# Patient Record
Sex: Female | Born: 1970 | Race: Black or African American | Hispanic: No | Marital: Married | Smoking: Former smoker
Health system: Southern US, Community
[De-identification: ages and names within clinical notes are randomized; demographics above are authoritative.]

## PROBLEM LIST (undated history)

## (undated) DIAGNOSIS — D573 Sickle-cell trait: Secondary | ICD-10-CM

## (undated) DIAGNOSIS — F419 Anxiety disorder, unspecified: Secondary | ICD-10-CM

## (undated) DIAGNOSIS — M24573 Contracture, unspecified ankle: Secondary | ICD-10-CM

## (undated) DIAGNOSIS — R42 Dizziness and giddiness: Secondary | ICD-10-CM

## (undated) DIAGNOSIS — A812 Progressive multifocal leukoencephalopathy: Secondary | ICD-10-CM

## (undated) DIAGNOSIS — M25579 Pain in unspecified ankle and joints of unspecified foot: Secondary | ICD-10-CM

## (undated) DIAGNOSIS — T7421XA Adult sexual abuse, confirmed, initial encounter: Secondary | ICD-10-CM

## (undated) DIAGNOSIS — N926 Irregular menstruation, unspecified: Secondary | ICD-10-CM

## (undated) DIAGNOSIS — F4310 Post-traumatic stress disorder, unspecified: Secondary | ICD-10-CM

## (undated) DIAGNOSIS — B20 Human immunodeficiency virus [HIV] disease: Secondary | ICD-10-CM

## (undated) DIAGNOSIS — Z590 Homelessness: Secondary | ICD-10-CM

## (undated) DIAGNOSIS — I639 Cerebral infarction, unspecified: Secondary | ICD-10-CM

## (undated) DIAGNOSIS — Z91410 Personal history of adult physical and sexual abuse: Secondary | ICD-10-CM

## (undated) DIAGNOSIS — G8190 Hemiplegia, unspecified affecting unspecified side: Secondary | ICD-10-CM

## (undated) DIAGNOSIS — M24576 Contracture, unspecified foot: Secondary | ICD-10-CM

## (undated) HISTORY — DX: Personal history of adult physical and sexual abuse: Z91.410

## (undated) HISTORY — DX: Human immunodeficiency virus (HIV) disease: B20

## (undated) HISTORY — PX: DENTAL SURGERY: SHX609

## (undated) HISTORY — DX: Irregular menstruation, unspecified: N92.6

## (undated) HISTORY — DX: Contracture, unspecified foot: M24.576

## (undated) HISTORY — DX: Hemiplegia, unspecified affecting unspecified side: G81.90

## (undated) HISTORY — DX: Homelessness: Z59.0

## (undated) HISTORY — DX: Contracture, unspecified ankle: M24.573

## (undated) HISTORY — DX: Pain in unspecified ankle and joints of unspecified foot: M25.579

## (undated) HISTORY — DX: Post-traumatic stress disorder, unspecified: F43.10

## (undated) HISTORY — DX: Adult sexual abuse, confirmed, initial encounter: T74.21XA

---

## 2011-01-25 DIAGNOSIS — G8929 Other chronic pain: Secondary | ICD-10-CM | POA: Insufficient documentation

## 2011-01-25 DIAGNOSIS — B20 Human immunodeficiency virus [HIV] disease: Secondary | ICD-10-CM | POA: Insufficient documentation

## 2011-01-25 DIAGNOSIS — A812 Progressive multifocal leukoencephalopathy: Secondary | ICD-10-CM | POA: Insufficient documentation

## 2011-11-29 DIAGNOSIS — B1920 Unspecified viral hepatitis C without hepatic coma: Secondary | ICD-10-CM | POA: Insufficient documentation

## 2012-11-13 DIAGNOSIS — R079 Chest pain, unspecified: Secondary | ICD-10-CM | POA: Insufficient documentation

## 2013-03-05 DIAGNOSIS — Z23 Encounter for immunization: Secondary | ICD-10-CM | POA: Insufficient documentation

## 2013-08-06 DIAGNOSIS — J302 Other seasonal allergic rhinitis: Secondary | ICD-10-CM | POA: Insufficient documentation

## 2014-04-30 DIAGNOSIS — Z8742 Personal history of other diseases of the female genital tract: Secondary | ICD-10-CM | POA: Insufficient documentation

## 2014-04-30 DIAGNOSIS — A630 Anogenital (venereal) warts: Secondary | ICD-10-CM | POA: Insufficient documentation

## 2014-09-02 DIAGNOSIS — R29898 Other symptoms and signs involving the musculoskeletal system: Secondary | ICD-10-CM | POA: Insufficient documentation

## 2014-10-07 DIAGNOSIS — K0889 Other specified disorders of teeth and supporting structures: Secondary | ICD-10-CM | POA: Insufficient documentation

## 2015-03-03 DIAGNOSIS — Z7251 High risk heterosexual behavior: Secondary | ICD-10-CM | POA: Insufficient documentation

## 2015-08-18 DIAGNOSIS — N390 Urinary tract infection, site not specified: Secondary | ICD-10-CM | POA: Insufficient documentation

## 2015-10-13 DIAGNOSIS — F1410 Cocaine abuse, uncomplicated: Secondary | ICD-10-CM | POA: Insufficient documentation

## 2016-12-07 ENCOUNTER — Ambulatory Visit (INDEPENDENT_AMBULATORY_CARE_PROVIDER_SITE_OTHER): Payer: Medicare (Managed Care) | Admitting: Infectious Disease

## 2016-12-07 ENCOUNTER — Ambulatory Visit (INDEPENDENT_AMBULATORY_CARE_PROVIDER_SITE_OTHER): Payer: Medicare (Managed Care) | Admitting: Licensed Clinical Social Worker

## 2016-12-07 ENCOUNTER — Encounter: Payer: Self-pay | Admitting: Infectious Disease

## 2016-12-07 VITALS — BP 116/73 | HR 73 | Temp 98.4°F | Wt 178.0 lb

## 2016-12-07 DIAGNOSIS — A812 Progressive multifocal leukoencephalopathy: Secondary | ICD-10-CM | POA: Diagnosis not present

## 2016-12-07 DIAGNOSIS — B182 Chronic viral hepatitis C: Secondary | ICD-10-CM | POA: Diagnosis not present

## 2016-12-07 DIAGNOSIS — T7421XA Adult sexual abuse, confirmed, initial encounter: Secondary | ICD-10-CM | POA: Insufficient documentation

## 2016-12-07 DIAGNOSIS — Z5900 Homelessness unspecified: Secondary | ICD-10-CM

## 2016-12-07 DIAGNOSIS — Z7251 High risk heterosexual behavior: Secondary | ICD-10-CM | POA: Diagnosis not present

## 2016-12-07 DIAGNOSIS — B20 Human immunodeficiency virus [HIV] disease: Secondary | ICD-10-CM | POA: Insufficient documentation

## 2016-12-07 DIAGNOSIS — I69851 Hemiplegia and hemiparesis following other cerebrovascular disease affecting right dominant side: Secondary | ICD-10-CM

## 2016-12-07 DIAGNOSIS — G8190 Hemiplegia, unspecified affecting unspecified side: Secondary | ICD-10-CM

## 2016-12-07 DIAGNOSIS — G8929 Other chronic pain: Secondary | ICD-10-CM

## 2016-12-07 DIAGNOSIS — F4322 Adjustment disorder with anxiety: Secondary | ICD-10-CM

## 2016-12-07 DIAGNOSIS — F4310 Post-traumatic stress disorder, unspecified: Secondary | ICD-10-CM | POA: Diagnosis not present

## 2016-12-07 DIAGNOSIS — F1410 Cocaine abuse, uncomplicated: Secondary | ICD-10-CM

## 2016-12-07 DIAGNOSIS — Z23 Encounter for immunization: Secondary | ICD-10-CM | POA: Diagnosis not present

## 2016-12-07 HISTORY — DX: Post-traumatic stress disorder, unspecified: F43.10

## 2016-12-07 HISTORY — DX: Homelessness unspecified: Z59.00

## 2016-12-07 HISTORY — DX: Adult sexual abuse, confirmed, initial encounter: T74.21XA

## 2016-12-07 HISTORY — DX: Hemiplegia, unspecified affecting unspecified side: G81.90

## 2016-12-07 HISTORY — DX: Human immunodeficiency virus (HIV) disease: B20

## 2016-12-07 MED ORDER — TRAZODONE HCL 50 MG PO TABS: 50.0000 mg | ORAL_TABLET | Freq: Every day | ORAL | 11 refills | Status: DC

## 2016-12-07 MED ORDER — DARUN-COBIC-EMTRICIT-TENOFAF 800-150-200-10 MG PO TABS: 1.0000 | ORAL_TABLET | Freq: Every day | ORAL | 5 refills | Status: DC
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
## 2016-12-07 NOTE — Progress Notes (Signed)
HPI: Paula Martinez is a 46 y.o. female who presents to the [facility] clinic to follow-up with Dr. Tommy Medal for her HIV infection.   Allergies: Not on File  Past Medical History: Past Medical History:  Diagnosis Date  . HIV disease ([place]) 12/07/2016    Social History: Social History   Social History  . Marital status: Single    Spouse name: N/A  . Number of children: N/A  . Years of education: N/A   Social History Main Topics  . Smoking status: Former Smoker    Packs/day: 0.20    Types: Cigarettes  . Smokeless tobacco: Never Used  . Alcohol use 0.6 oz/week    1 Glasses of wine per week     Comment: occ  . Drug use: No  . Sexual activity: Not Asked   Other Topics Concern  . None   Social History Narrative  . None    Current Regimen: Reyataz + Norvir + Truvada  Labs: No results found for: HIV1RNAQUANT, HIV1RNAVL, CD4TABS, HEPBSAB, HEPBSAG, HCVAB  CrCl: CrCl cannot be calculated (No order found.).  Lipids: No results found for: CHOL, TRIG, HDL, CHOLHDL, VLDL, LDLCALC  Assessment: Paula Martinez is here today to follow-up with Dr. Tommy Medal for her HIV infection.  She is currently taking Reyataz + Norvir + Truvada.   She is a new patient to our clinic.  Dr. Tommy Medal would like to simply her regimen.  Since we have no history or drug resistance data, we will change her to [place].  She used to get her medications from [facility] in [place] and would like to go back to using them as they treat her very well and are very good to her.  I spent some time explaining Symtuza to her and how to take it, including with a meal.  I gave her my card and told her to call me with any issues.  She will come back and see [place] in 1 month.   Plans: - Stop Reyataz, Norvir, and Truvada - Start Symtuza PO once daily - F/u with me 10/30 at 230pm  Cassie L. Kuppelweiser, PharmD, [facility] for Infectious Disease 12/07/2016, 3:06 PM

## 2016-12-08 ENCOUNTER — Telehealth: Payer: Self-pay | Admitting: *Deleted

## 2016-12-08 DIAGNOSIS — B20 Human immunodeficiency virus [HIV] disease: Secondary | ICD-10-CM
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
## 2016-12-08 NOTE — Telephone Encounter (Signed)
Referral received to offer services to Ms Cissell. Assistance may be short term since the patient historically has done excellent with managing her HIV but may still be needed since she is new to the area/resources. Contacted Ms Urban Gibson and did not receive an answer, message left.  Received a call back and spoke with Ms. Urban Gibson who is excited to have a visit with me to discuss possible resources for her. Home visit scheduled for tomorrow

## 2016-12-08 NOTE — Telephone Encounter (Signed)
Received call from Travis(CMA) stating Dr Drucilla Schmidt would like for me engage and offer assistance with Ms. Urban Gibson. Report given on patient's possible current needs. Call will be placed to [name] to offer services

## 2016-12-09 ENCOUNTER — Ambulatory Visit: Payer: Self-pay | Admitting: *Deleted

## 2016-12-09 ENCOUNTER — Telehealth: Payer: Self-pay | Admitting: *Deleted

## 2016-12-09 VITALS — BP 110/68 | HR 84

## 2016-12-09 DIAGNOSIS — B20 Human immunodeficiency virus [HIV] disease: Secondary | ICD-10-CM

## 2016-12-09 DIAGNOSIS — A812 Progressive multifocal leukoencephalopathy: Secondary | ICD-10-CM

## 2016-12-09 DIAGNOSIS — T7421XA Adult sexual abuse, confirmed, initial encounter: Secondary | ICD-10-CM

## 2016-12-09 LAB — T-HELPER CELL (CD4) - (RCID CLINIC ONLY)
CD4 % Helper T Cell: 31 % — ABNORMAL LOW (ref 33–55)
CD4 T Cell Abs: 1200 /uL (ref 400–2700)

## 2016-12-12 ENCOUNTER — Encounter: Payer: Self-pay | Admitting: *Deleted

## 2016-12-14 ENCOUNTER — Telehealth: Payer: Self-pay | Admitting: Licensed Clinical Social Worker

## 2016-12-14 ENCOUNTER — Ambulatory Visit: Payer: Medicare (Managed Care) | Admitting: Licensed Clinical Social Worker

## 2016-12-14 NOTE — Telephone Encounter (Signed)
Patient called and stated that she forgot about attending appointment but wanted to wait before she schedules another appointment.  Patient stated she has an appointment with [facility] therapist on Oct 11th and plans to attend.  Sande Rives, [facility]

## 2016-12-14 NOTE — Telephone Encounter (Signed)
[facility] left message regarding missed appointment today requesting return phone call.  Paula Martinez, [facility]

## 2016-12-19 LAB — LIPID PANEL
CHOL/HDL RATIO: 2.8 (calc) (ref <5.0)
CHOLESTEROL: 147 mg/dL (ref <200)
HDL: 52 mg/dL (ref >50)
LDL CHOLESTEROL (CALC): 79 mg/dL
NON-HDL CHOLESTEROL (CALC): 95 mg/dL (ref <130)
Triglycerides: 78 mg/dL (ref <150)

## 2016-12-19 LAB — CBC WITH DIFFERENTIAL/PLATELET
BASOS ABS: 33 {cells}/uL (ref 0–200)
Basophils Relative: 0.5 %
Eosinophils Absolute: 130 cells/uL (ref 15–500)
Eosinophils Relative: 2 %
HEMATOCRIT: 41.3 % (ref 35.0–45.0)
Hemoglobin: 14.4 g/dL (ref 11.7–15.5)
LYMPHS ABS: 3523 {cells}/uL (ref 850–3900)
MCH: 29.4 pg (ref 27.0–33.0)
MCHC: 34.9 g/dL (ref 32.0–36.0)
MCV: 84.3 fL (ref 80.0–100.0)
MPV: 12.1 fL (ref 7.5–12.5)
Monocytes Relative: 6 %
NEUTROS PCT: 37.3 %
Neutro Abs: 2425 cells/uL (ref 1500–7800)
Platelets: 204 10*3/uL (ref 140–400)
RBC: 4.9 10*6/uL (ref 3.80–5.10)
RDW: 12.8 % (ref 11.0–15.0)
Total Lymphocyte: 54.2 %
WBC: 6.5 10*3/uL (ref 3.8–10.8)
WBCMIX: 390 {cells}/uL (ref 200–950)

## 2016-12-19 LAB — HIV-1 RNA ULTRAQUANT REFLEX TO GENTYP+: HIV-1 RNA Quant, Log: <1.3 Log cps/mL — ABNORMAL HIGH

## 2016-12-19 LAB — HEPATITIS C RNA QUANTITATIVE
HCV Quantitative Log: <1.18 Log IU/mL
HCV RNA, PCR, QN: <15 IU/mL

## 2016-12-19 LAB — COMPLETE METABOLIC PANEL WITH GFR
AG Ratio: 1.4 (calc) (ref 1.0–2.5)
ALBUMIN MSPROF: 4 g/dL (ref 3.6–5.1)
ALKALINE PHOSPHATASE (APISO): 84 U/L (ref 33–115)
ALT: 13 U/L (ref 6–29)
AST: 13 U/L (ref 10–35)
BUN: 12 mg/dL (ref 7–25)
CO2: 21 mmol/L (ref 20–32)
CREATININE: 0.66 mg/dL (ref 0.50–1.10)
Calcium: 9.6 mg/dL (ref 8.6–10.2)
Chloride: 108 mmol/L (ref 98–110)
GFR, EST AFRICAN AMERICAN: 123 mL/min/{1.73_m2} (ref >=60)
GFR, EST NON AFRICAN AMERICAN: 106 mL/min/{1.73_m2} (ref >=60)
GLOBULIN: 2.9 g/dL (ref 1.9–3.7)
Glucose, Bld: 91 mg/dL (ref 65–99)
Potassium: 3.9 mmol/L (ref 3.5–5.3)
SODIUM: 139 mmol/L (ref 135–146)
TOTAL PROTEIN: 6.9 g/dL (ref 6.1–8.1)
Total Bilirubin: 2.1 mg/dL — ABNORMAL HIGH (ref 0.2–1.2)

## 2016-12-19 LAB — RPR: RPR Ser Ql: NONREACTIVE

## 2016-12-21 ENCOUNTER — Telehealth: Payer: Self-pay | Admitting: *Deleted

## 2016-12-21 NOTE — Telephone Encounter (Signed)
Contacted [employer] for assistance with available resources for patients that need assistance in the home but cannot afford them. APS says they have a free in home aide program but it has a long waiting list. APS stated [name] can be placed on the waiting list for services. Left a message with the free in home aide program through [employer] with the reason why Ms Paula Martinez needs the assistance along with her contact information for a callback

## 2016-12-21 NOTE — Telephone Encounter (Signed)
Contact PT/Rehab center to f/u on referral sent by Dr Tommy Medal. I would like to know if that have been able to reach the patient for scheduling an assessment. Left a message stating the above along with my name and number in additional to the patient's name and number

## 2017-01-04 ENCOUNTER — Telehealth: Payer: Self-pay | Admitting: *Deleted

## 2017-01-04 ENCOUNTER — Encounter: Payer: Self-pay | Admitting: Infectious Disease

## 2017-01-04 NOTE — Telephone Encounter (Signed)
RN received a text message from Ms Paula Martinez stating she is having a lot of pain and would like to know when she can start her physical therapy. Rehab has stated it will take up to 4 to 6 weeks to schedule the patient for therapy. RN contacted Rehab to see if the 4 to 6 weeks applies to all patients even if they are living alone and a high falls risk (for example a new stroke patient that lives alone). I reviewed the referral and I can see that the PT scheduling is being worked on so I did not place a call for Therapy updates. I did respond back to [name] making her aware that her referral for therapy is being worked on

## 2017-01-06 ENCOUNTER — Other Ambulatory Visit: Payer: Self-pay | Admitting: Pharmacist

## 2017-01-10 ENCOUNTER — Ambulatory Visit: Payer: Medicare (Managed Care)
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
## 2017-01-16 NOTE — Patient Instructions (Signed)
RN met with client for assessment. RN reviewed Agency Services, [place] Notice of Privacy Policy, Home Safety Management Information Booklet. Home Fire Safety Assessment, Fall Risk Assessment and Suicide Risk Assessment was performed. RN also discussed information on a Living Will, Advanced Directives, and Health Care Power of Attorney. RN and Client/Designated Party educated/reviewed/signed Client Agreement and Consent for Service form along with Patient Rights and Responsibilities statement. RN developed patient specific and centered care plan. RN provided contact information and reviewed how to receive emergency help after hours for schedule changes, billing questions, reporting of safety issues, falls, concerns or any needs/questions. Standard Precaution and Infection control along with interventions to correct or prevent high risk behaviors instructed to the patient. Client/Caregiver reports understanding and agreement with the above 

## 2017-01-17 ENCOUNTER — Encounter: Payer: Self-pay | Admitting: *Deleted

## 2017-01-18 ENCOUNTER — Ambulatory Visit: Payer: Self-pay | Admitting: *Deleted

## 2017-01-18 DIAGNOSIS — A812 Progressive multifocal leukoencephalopathy: Secondary | ICD-10-CM

## 2017-01-18 DIAGNOSIS — B20 Human immunodeficiency virus [HIV] disease: Secondary | ICD-10-CM

## 2017-01-18 NOTE — Progress Notes (Signed)
RN made a home visit with Paula Martinez. She states all has been well. She has here medications and continues to take the medications each day. She has seen vocational services because she would like to be evaluated for employment. She also had a evaluation for in home aide services offered through [facility]. Together we reviewed the updates on her therapy referral and she is in agreement to any type of therapy that may increase her mobility. Paula Martinez would love to be a part of peer support for other women living with HIV. Paula Martinez also stated that she really needs a PAP smear because she has a hx of HPV and would like to be evaluated for precancerous cells. Message sent to our NP asking if the patient can receive a PAP smear during her visit with Dr Tommy Medal on the 26th since transportation is a concern for the patient. Typically Paula Martinez uses [name] which is expensive. She is using condoms and denies additional condoms at this time stating she has plenty  Visit for over 2hrs with Paula Martinez.

## 2017-01-30 ENCOUNTER — Telehealth: Payer: Self-pay | Admitting: *Deleted

## 2017-01-30 ENCOUNTER — Encounter: Payer: Self-pay | Admitting: *Deleted

## 2017-01-30 ENCOUNTER — Ambulatory Visit: Payer: Self-pay | Admitting: *Deleted

## 2017-01-30 DIAGNOSIS — B20 Human immunodeficiency virus [HIV] disease: Secondary | ICD-10-CM

## 2017-01-30 DIAGNOSIS — T7421XA Adult sexual abuse, confirmed, initial encounter: Secondary | ICD-10-CM

## 2017-01-30 DIAGNOSIS — A812 Progressive multifocal leukoencephalopathy: Secondary | ICD-10-CM

## 2017-01-30 NOTE — Telephone Encounter (Signed)
Received a message from the patient asking me to please give her a call. Spoke with Ms Paula Martinez and she asked if I could come over to assist her today. RN scheduled a home visit for Ms Paula Martinez for today

## 2017-02-06 ENCOUNTER — Encounter: Payer: Self-pay | Admitting: Infectious Disease

## 2017-02-06 ENCOUNTER — Other Ambulatory Visit: Payer: Self-pay | Admitting: Infectious Disease

## 2017-02-06 ENCOUNTER — Ambulatory Visit (INDEPENDENT_AMBULATORY_CARE_PROVIDER_SITE_OTHER): Payer: Medicare (Managed Care) | Admitting: Infectious Disease

## 2017-02-06 ENCOUNTER — Ambulatory Visit (INDEPENDENT_AMBULATORY_CARE_PROVIDER_SITE_OTHER): Payer: Medicare (Managed Care) | Admitting: Licensed Clinical Social Worker

## 2017-02-06 VITALS — BP 146/82 | HR 73 | Temp 99.6°F

## 2017-02-06 DIAGNOSIS — R87618 Other abnormal cytological findings on specimens from cervix uteri: Secondary | ICD-10-CM

## 2017-02-06 DIAGNOSIS — R252 Cramp and spasm: Secondary | ICD-10-CM

## 2017-02-06 DIAGNOSIS — I69851 Hemiplegia and hemiparesis following other cerebrovascular disease affecting right dominant side: Secondary | ICD-10-CM | POA: Diagnosis not present

## 2017-02-06 DIAGNOSIS — F4322 Adjustment disorder with anxiety: Secondary | ICD-10-CM

## 2017-02-06 DIAGNOSIS — M24573 Contracture, unspecified ankle: Secondary | ICD-10-CM

## 2017-02-06 DIAGNOSIS — M24571 Contracture, right ankle: Secondary | ICD-10-CM

## 2017-02-06 DIAGNOSIS — M24574 Contracture, right foot: Secondary | ICD-10-CM

## 2017-02-06 DIAGNOSIS — M25579 Pain in unspecified ankle and joints of unspecified foot: Secondary | ICD-10-CM | POA: Insufficient documentation

## 2017-02-06 DIAGNOSIS — F4310 Post-traumatic stress disorder, unspecified: Secondary | ICD-10-CM

## 2017-02-06 DIAGNOSIS — A812 Progressive multifocal leukoencephalopathy: Secondary | ICD-10-CM | POA: Diagnosis not present

## 2017-02-06 DIAGNOSIS — M24576 Contracture, unspecified foot: Secondary | ICD-10-CM

## 2017-02-06 DIAGNOSIS — B20 Human immunodeficiency virus [HIV] disease: Secondary | ICD-10-CM

## 2017-02-06 DIAGNOSIS — T7421XA Adult sexual abuse, confirmed, initial encounter: Secondary | ICD-10-CM

## 2017-02-06 DIAGNOSIS — G8929 Other chronic pain: Secondary | ICD-10-CM

## 2017-02-06 DIAGNOSIS — Z113 Encounter for screening for infections with a predominantly sexual mode of transmission: Secondary | ICD-10-CM

## 2017-02-06 DIAGNOSIS — Z79899 Other long term (current) drug therapy: Secondary | ICD-10-CM

## 2017-02-06 DIAGNOSIS — M25571 Pain in right ankle and joints of right foot: Secondary | ICD-10-CM

## 2017-02-06 HISTORY — DX: Pain in unspecified ankle and joints of unspecified foot: M25.579

## 2017-02-06 HISTORY — DX: Contracture, unspecified ankle: M24.573

## 2017-02-06 HISTORY — DX: Contracture, unspecified foot: M24.576
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
## 2017-02-06 NOTE — BH Specialist Note (Signed)
Integrated Behavioral Health Follow Up Visit  MRN: [id number] Name: Paula Martinez  Number of [place] Clinician visits: 2/6 Session Start time: 12:01 pm  Session End time: 12:06 pm Total time: 5 mins  Type of Service: [place] Interpretor:No. Interpretor Name and Language: N/A  SUBJECTIVE: Paula Martinez is a 46 y.o. female accompanied by self Patient was referred by Dr. Tommy Medal pain.  Patient stated that she was not sure why she was meeting with the [facility], even though Dr. Tommy Medal introduced [facility] to patient.  [facility] asked patient if she remembered her and patient stated, "Of course I do.  I'm in a wheel chair not stupid".  Throughout the interview the patient was agitated and irritable and when the [facility] mentioned the level of anger in the room the patient stated that it was not directed toward the [facility] but because she was in pain.  Patient also reported that she attends "therapy" with Earl Lagos and then referred to her as a "Medical doctor".  She stated that she meets with her monthly because she only has "PTSD".  Patient then shared that she informed the [facility] of this on a previous occasion and that she does not need to meet with the [facility] further.    Severity of problem: moderate  OBJECTIVE: Mood: Irritable and agitated and Affect: Anger Risk of harm to self or others: No plan to harm self or others  ASSESSMENT: Patient is currently experiencing anger and irritability, which may be related to the experience of pain.  Patient may benefit from receiving a pain assessment from medical staff, behavioral health services and medication management.  GOALS ADDRESSED: Patient will: 1.  Reduce symptoms of: anxiety and anger  2.  Increase knowledge and/or ability of: coping skills and self-management skills  3.  Demonstrate ability to: Increase healthy adjustment to current life circumstances, Increase adequate support systems for  patient/family and Increase motivation to adhere to plan of care  INTERVENTIONS: Interventions utilized:  Solution-Focused Strategies  PLAN: 1. Patient will continue receiving behavioral health services from [facility].  Sande Rives, [facility]

## 2017-02-07 LAB — CBC WITH DIFFERENTIAL/PLATELET
BASOS ABS: 51 {cells}/uL (ref 0–200)
Basophils Relative: 0.9 %
EOS ABS: 108 {cells}/uL (ref 15–500)
Eosinophils Relative: 1.9 %
HEMATOCRIT: 40.8 % (ref 35.0–45.0)
Hemoglobin: 14.1 g/dL (ref 11.7–15.5)
LYMPHS ABS: 2690 {cells}/uL (ref 850–3900)
MCH: 29 pg (ref 27.0–33.0)
MCHC: 34.6 g/dL (ref 32.0–36.0)
MCV: 84 fL (ref 80.0–100.0)
MPV: 11.6 fL (ref 7.5–12.5)
Monocytes Relative: 5.8 %
Neutro Abs: 2519 cells/uL (ref 1500–7800)
Neutrophils Relative %: 44.2 %
PLATELETS: 243 10*3/uL (ref 140–400)
RBC: 4.86 10*6/uL (ref 3.80–5.10)
RDW: 12.8 % (ref 11.0–15.0)
TOTAL LYMPHOCYTE: 47.2 %
WBC: 5.7 10*3/uL (ref 3.8–10.8)
WBCMIX: 331 {cells}/uL (ref 200–950)

## 2017-02-07 LAB — COMPLETE METABOLIC PANEL WITH GFR
AG Ratio: 1.3 (calc) (ref 1.0–2.5)
ALKALINE PHOSPHATASE (APISO): 67 U/L (ref 33–115)
ALT: 18 U/L (ref 6–29)
AST: 14 U/L (ref 10–35)
Albumin: 3.8 g/dL (ref 3.6–5.1)
BUN: 8 mg/dL (ref 7–25)
CO2: 25 mmol/L (ref 20–32)
CREATININE: 0.55 mg/dL (ref 0.50–1.10)
Calcium: 8.8 mg/dL (ref 8.6–10.2)
Chloride: 106 mmol/L (ref 98–110)
GFR, Est African American: 130 mL/min/{1.73_m2} (ref >=60)
GFR, Est Non African American: 112 mL/min/{1.73_m2} (ref >=60)
GLOBULIN: 3 g/dL (ref 1.9–3.7)
GLUCOSE: 90 mg/dL (ref 65–99)
POTASSIUM: 3.8 mmol/L (ref 3.5–5.3)
SODIUM: 138 mmol/L (ref 135–146)
Total Bilirubin: 0.5 mg/dL (ref 0.2–1.2)
Total Protein: 6.8 g/dL (ref 6.1–8.1)

## 2017-02-07 LAB — LIPID PANEL
Cholesterol: 188 mg/dL (ref <200)
HDL: 61 mg/dL (ref >50)
LDL Cholesterol (Calc): 110 mg/dL (calc) — ABNORMAL HIGH
NON-HDL CHOLESTEROL (CALC): 127 mg/dL (ref <130)
TRIGLYCERIDES: 76 mg/dL (ref <150)
Total CHOL/HDL Ratio: 3.1 (calc) (ref <5.0)

## 2017-02-07 LAB — T-HELPER CELL (CD4) - (RCID CLINIC ONLY)
CD4 % Helper T Cell: 27 % — ABNORMAL LOW (ref 33–55)
CD4 T Cell Abs: 810 /uL (ref 400–2700)

## 2017-02-07 LAB — RPR: RPR Ser Ql: NONREACTIVE

## 2017-02-08 ENCOUNTER — Other Ambulatory Visit: Payer: Medicare (Managed Care) | Admitting: *Deleted

## 2017-02-08 LAB — HIV-1 RNA ULTRAQUANT REFLEX TO GENTYP+
HIV 1 RNA Quant: <20 Copies/mL — ABNORMAL HIGH
HIV-1 RNA Quant, Log: <1.3 Log cps/mL — ABNORMAL HIGH

## 2017-02-08 NOTE — Telephone Encounter (Signed)
I have reviewed this plan of care and approve of it.
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
## 2017-02-13 ENCOUNTER — Encounter: Payer: Self-pay | Admitting: Licensed Clinical Social Worker

## 2017-02-15 ENCOUNTER — Telehealth: Payer: Self-pay | Admitting: *Deleted

## 2017-02-15 NOTE — Telephone Encounter (Signed)
RN received a text message from Paula Martinez asking I could could schedule her rehab appt and give her a call back with the date and time. Paula Martinez wants to give her new job the notice as soon as possibe. RN replied and apologized to her but stating I cannot schedule appointments for other departments but I can reach out to the department and ask that they contact her for scheduleding. I checked the actual referral not possible notes and did not see any so I will route the message to the Neuro rehab

## 2017-03-09 ENCOUNTER — Telehealth: Payer: Self-pay | Admitting: *Deleted

## 2017-03-10 NOTE — Progress Notes (Signed)
Rn made a home visit with Paula Martinez, who is doing amazing with managing her HIV. She has received her medications and states she has not missed a day with taking her medications. Focus right now is to assist [name] with linkage to community resources as she establishes herself in [place].

## 2017-03-13 ENCOUNTER — Ambulatory Visit: Payer: Self-pay | Admitting: *Deleted

## 2017-03-13 DIAGNOSIS — B20 Human immunodeficiency virus [HIV] disease: Secondary | ICD-10-CM

## 2017-03-13 DIAGNOSIS — A812 Progressive multifocal leukoencephalopathy: Secondary | ICD-10-CM

## 2017-03-17 NOTE — Telephone Encounter (Signed)
PATIENT ON HOLD  Plan of Care orders have expired effective 03/09/17 but GOALS have not been completely meet at this time. I would like to connect with the patient and received further orders from MD. If I am unable to get in contact with her within the next 30 days I will have to discharge at that time. RN WILL NOT RESUME CARE UNTIL NEW MD ORDERS OBTAINED AND PATIENT ABLE/WILLING TO RE-ENGAGE IN CARE

## 2017-03-17 NOTE — Telephone Encounter (Signed)
Opened in ERROR

## 2017-03-20 NOTE — Telephone Encounter (Signed)
Understood [name]!

## 2017-03-23 ENCOUNTER — Telehealth: Payer: Self-pay | Admitting: *Deleted

## 2017-03-23 NOTE — Telephone Encounter (Signed)
RN contacted the patient as an attempt to stay connected/engaged. RN left a message stating that I wanted to be sure all is well and to please let me know if I can assist in any way to help in preparing for possible inclement weather

## 2017-04-10 ENCOUNTER — Telehealth: Payer: Self-pay | Admitting: *Deleted

## 2017-04-11 ENCOUNTER — Other Ambulatory Visit: Payer: Self-pay | Admitting: *Deleted

## 2017-04-11 DIAGNOSIS — B20 Human immunodeficiency virus [HIV] disease: Secondary | ICD-10-CM

## 2017-04-11 MED ORDER — DARUN-COBIC-EMTRICIT-TENOFAF 800-150-200-10 MG PO TABS: 1.0000 | ORAL_TABLET | Freq: Every day | ORAL | 5 refills | Status: DC

## 2017-04-11 NOTE — Telephone Encounter (Signed)
Received a text message from Paula Martinez stated she needs her medications but the pharmacy needs something from Dr Drucilla Schmidt. I contacted the Pharmacy([facility]) and they stated they just need a refill order. Her last refill was ordered in 2018 by a out of town Dr and they just need Dr Tommy Medal to seen in a order for her refills.  Refill sent.
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
## 2017-05-04 ENCOUNTER — Telehealth: Payer: Self-pay | Admitting: *Deleted

## 2017-05-04 NOTE — Telephone Encounter (Signed)
RN received a call from [place] questioning if her insurance will cover the cost of a in home aide. I met her know that I spoke with her insurance and they do cover the cost of a in home aide but only if she is considered homebound. She asked for me to give her the number so she can call and speak with them.

## 2017-05-29 ENCOUNTER — Telehealth: Payer: Self-pay | Admitting: *Deleted

## 2017-05-29 NOTE — Telephone Encounter (Signed)
Patient is calling to see what she needs to do to get a home health aide, says she is paralyzed on 1/2 of her body, is unable to make her bed/sweep/housekeeping.  Will 'cc [name] on this, as she has been in contact with [name] regarding this request. Landis Gandy, RN

## 2017-05-29 NOTE — Telephone Encounter (Signed)
Will 'cc Marena Chancy for referral status update/see if there is a need to re-refer.

## 2017-05-29 NOTE — Telephone Encounter (Signed)
I made referral to PT and I thought also to rehab. I dont see any notes though?

## 2017-05-30 NOTE — Telephone Encounter (Signed)
I spoke with Ms Paula Martinez and she asked me to call her insurance for her because they reassured her that she could receive in home aide services. I called the insurance and the coverage it only for homebound patients. Ms Paula Martinez is working 40hrs a week so she does not qualify through her insurance. I also did a APS referral at [name] request and [facility] put her on the waiting list for a in home aide. Dr Tommy Medal did do a referral but they declined her stating she needs pain management or neuro rehab but if I remember correctly she could not make her evaluation appt with them because she had to work and asked me if I could go for her.

## 2017-05-30 NOTE — Telephone Encounter (Signed)
It looks like Therapy called Ms Burman Riis and she just needs to return Kizzy Green/Outpatient rehab call from December.

## 2017-06-06 ENCOUNTER — Ambulatory Visit: Payer: Medicare (Managed Care) | Admitting: Licensed Clinical Social Worker

## 2017-06-06 ENCOUNTER — Encounter: Payer: Self-pay | Admitting: Infectious Disease

## 2017-06-06 ENCOUNTER — Ambulatory Visit (INDEPENDENT_AMBULATORY_CARE_PROVIDER_SITE_OTHER): Payer: Medicare Other | Admitting: Infectious Disease

## 2017-06-06 VITALS — BP 173/103 | HR 79 | Temp 98.3°F | Wt 178.0 lb

## 2017-06-06 DIAGNOSIS — A812 Progressive multifocal leukoencephalopathy: Secondary | ICD-10-CM | POA: Diagnosis not present

## 2017-06-06 DIAGNOSIS — I69851 Hemiplegia and hemiparesis following other cerebrovascular disease affecting right dominant side: Secondary | ICD-10-CM | POA: Diagnosis not present

## 2017-06-06 DIAGNOSIS — N926 Irregular menstruation, unspecified: Secondary | ICD-10-CM

## 2017-06-06 DIAGNOSIS — B20 Human immunodeficiency virus [HIV] disease: Secondary | ICD-10-CM | POA: Diagnosis not present

## 2017-06-06 DIAGNOSIS — Z23 Encounter for immunization: Secondary | ICD-10-CM | POA: Diagnosis not present

## 2017-06-06 DIAGNOSIS — F4310 Post-traumatic stress disorder, unspecified: Secondary | ICD-10-CM | POA: Diagnosis not present

## 2017-06-06 DIAGNOSIS — B182 Chronic viral hepatitis C: Secondary | ICD-10-CM | POA: Diagnosis not present

## 2017-06-06 DIAGNOSIS — Z79899 Other long term (current) drug therapy: Secondary | ICD-10-CM

## 2017-06-06 DIAGNOSIS — Z113 Encounter for screening for infections with a predominantly sexual mode of transmission: Secondary | ICD-10-CM

## 2017-06-06 DIAGNOSIS — F419 Anxiety disorder, unspecified: Secondary | ICD-10-CM

## 2017-06-06 HISTORY — DX: Irregular menstruation, unspecified: N92.6

## 2017-06-06 LAB — POCT URINE PREGNANCY: Preg Test, Ur: NEGATIVE

## 2017-06-06 MED ORDER — DARUN-COBIC-EMTRICIT-TENOFAF 800-150-200-10 MG PO TABS: 1.0000 | ORAL_TABLET | Freq: Every day | ORAL | 11 refills | Status: DC
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
## 2017-06-06 NOTE — Addendum Note (Signed)
Addended by: Alberteen Sam on: 06/06/2017 03:50 PM   Modules accepted: Orders

## 2017-06-07 LAB — LIPID PANEL
Cholesterol: 172 mg/dL (ref <200)
HDL: 64 mg/dL (ref >50)
LDL Cholesterol (Calc): 94 mg/dL (calc)
NON-HDL CHOLESTEROL (CALC): 108 mg/dL (ref <130)
TRIGLYCERIDES: 59 mg/dL (ref <150)
Total CHOL/HDL Ratio: 2.7 (calc) (ref <5.0)

## 2017-06-07 LAB — COMPLETE METABOLIC PANEL WITH GFR
AG Ratio: 1.4 (calc) (ref 1.0–2.5)
ALT: 16 U/L (ref 6–29)
AST: 15 U/L (ref 10–35)
Albumin: 4.1 g/dL (ref 3.6–5.1)
Alkaline phosphatase (APISO): 79 U/L (ref 33–115)
BUN: 8 mg/dL (ref 7–25)
CO2: 24 mmol/L (ref 20–32)
CREATININE: 0.79 mg/dL (ref 0.50–1.10)
Calcium: 9.5 mg/dL (ref 8.6–10.2)
Chloride: 105 mmol/L (ref 98–110)
GFR, Est African American: 104 mL/min/{1.73_m2} (ref >=60)
GFR, Est Non African American: 90 mL/min/{1.73_m2} (ref >=60)
GLOBULIN: 3 g/dL (ref 1.9–3.7)
Glucose, Bld: 102 mg/dL — ABNORMAL HIGH (ref 65–99)
Potassium: 3.5 mmol/L (ref 3.5–5.3)
SODIUM: 139 mmol/L (ref 135–146)
Total Bilirubin: 0.4 mg/dL (ref 0.2–1.2)
Total Protein: 7.1 g/dL (ref 6.1–8.1)

## 2017-06-07 LAB — LUTEINIZING HORMONE: LH: 5.5 m[IU]/mL

## 2017-06-07 LAB — HCG, QUANTITATIVE, PREGNANCY: HCG, Total, QN: <2 m[IU]/mL

## 2017-06-07 LAB — CBC WITH DIFFERENTIAL/PLATELET
BASOS PCT: 0.9 %
Basophils Absolute: 60 cells/uL (ref 0–200)
Eosinophils Absolute: 101 cells/uL (ref 15–500)
Eosinophils Relative: 1.5 %
HCT: 43 % (ref 35.0–45.0)
Hemoglobin: 14.8 g/dL (ref 11.7–15.5)
Lymphs Abs: 3082 cells/uL (ref 850–3900)
MCH: 28.9 pg (ref 27.0–33.0)
MCHC: 34.4 g/dL (ref 32.0–36.0)
MCV: 84 fL (ref 80.0–100.0)
MONOS PCT: 5.5 %
MPV: 11.1 fL (ref 7.5–12.5)
NEUTROS PCT: 46.1 %
Neutro Abs: 3089 cells/uL (ref 1500–7800)
PLATELETS: 215 10*3/uL (ref 140–400)
RBC: 5.12 10*6/uL — AB (ref 3.80–5.10)
RDW: 12.3 % (ref 11.0–15.0)
TOTAL LYMPHOCYTE: 46 %
WBC mixed population: 369 cells/uL (ref 200–950)
WBC: 6.7 10*3/uL (ref 3.8–10.8)

## 2017-06-07 LAB — URINE CYTOLOGY ANCILLARY ONLY
Chlamydia: NEGATIVE
NEISSERIA GONORRHEA: NEGATIVE

## 2017-06-07 LAB — FOLLICLE STIMULATING HORMONE: FSH: 4.7 m[IU]/mL

## 2017-06-07 LAB — RPR: RPR: NONREACTIVE

## 2017-06-07 LAB — TSH: TSH: 0.43 mIU/L

## 2017-06-08 LAB — T-HELPER CELL (CD4) - (RCID CLINIC ONLY)
CD4 T CELL ABS: 830 /uL (ref 400–2700)
CD4 T CELL HELPER: 27 % — AB (ref 33–55)

## 2017-06-08 NOTE — Telephone Encounter (Signed)
The intent of this communication is to inform the Health Care Team that this patient will be discharged from [facility]) with GOALS MET.    Moving forward, the [facility] will be willing to reopen the patient to services if a new need arrives. Effective 04/10/17 patient will be discharged and removed from [facility] active patient listing.

## 2017-06-09 LAB — HIV-1 RNA QUANT-NO REFLEX-BLD
HIV 1 RNA Quant: <20 copies/mL
HIV-1 RNA Quant, Log: <1.3 Log copies/mL

## 2017-07-05 ENCOUNTER — Encounter: Payer: Self-pay | Admitting: Infectious Diseases

## 2017-07-07 ENCOUNTER — Telehealth: Payer: Self-pay

## 2017-07-07 ENCOUNTER — Telehealth: Payer: Self-pay | Admitting: Infectious Disease

## 2017-07-07 NOTE — Telephone Encounter (Signed)
[name] Maybe Delories Heinz can go to her house and do an assessment IF she feels comforttable doing so. Then if [name] writes up what patient needs I will sign orders documentation to that effect.   I am skeptical she will ever go to see PT or rehab  I cannot assess her home needs from the clinic nor even her physical limitations

## 2017-07-07 NOTE — Telephone Encounter (Signed)
I have been over this like I dont know how many times. THE HOME CARE AGENCY OR OTHER QUALIFIED PROFESSIONAL NEED TO LET ME KNOW WHAT NEEDS SHE HAS I AM NOT A REHAB [place]

## 2017-07-07 NOTE — Telephone Encounter (Signed)
Patient called wanting a prescription written for home care. She stated that she already spoke with Dr. Rhina Brackett [place],MD regarding this situation. She said that she spoke to home care agency and they stated that she she needs a prescription stating what services she needs and a nurse assessment. Please advise. Pola Corn, LPN

## 2017-07-14 NOTE — Telephone Encounter (Signed)
Contacted ms. [name] and visit is scheduled for Monday to reassess her home health needs

## 2017-07-18 ENCOUNTER — Ambulatory Visit: Payer: Self-pay | Admitting: *Deleted

## 2017-07-18 DIAGNOSIS — A812 Progressive multifocal leukoencephalopathy: Secondary | ICD-10-CM

## 2017-07-18 DIAGNOSIS — B20 Human immunodeficiency virus [HIV] disease: Secondary | ICD-10-CM

## 2017-07-18 DIAGNOSIS — I69851 Hemiplegia and hemiparesis following other cerebrovascular disease affecting right dominant side: Secondary | ICD-10-CM

## 2017-07-18 NOTE — Progress Notes (Addendum)
Home visit made with Paula Martinez and she has stated that she really needs assistance with meal prep and assistance with getting dressed for appointments or for the day. Paula Martinez stated her MCR coverage says she will qualify for a in home services. I explained to Paula Martinez that usually MCR covers [place] which is different from [place].   I explained the difference stating:   Home Health Care: is a medical order for service that is ordered by the Doctor for rehabilitation. [facility] offers in home PT, OT, Nurse and AIDE to homebound patients with a focus on rehabilitation.  Home Care is non-medical and does not require a Dr's order but is what she needs.   Home Care: offers help with personal grooming, like bathing or getting dressed Help with moving around, getting in and out of bed or the shower, medication reminders, help preparing meals, help with household chores like vacuuming or doing laundry, companionship and friendship  Paula Martinez stated she was told by her insurance the difference between the two but was told the she can receive help in her home. Paula Martinez stated she has always received in home services for no more than 3 hours and I attempted to explain again that the Aides with [place] agencies do not stay for a certain amount of hours and will only assist her with getting bathed and dressed while she is rehabilitating with PT, OT or Nursing. After PT, OT and Nursing are finish rehabilitating the AIDE services stops too. Paula Martinez call the insurance company and they stated they cover in home care with [place]. The insurance agency explained to her that they cover [place] services for homebound patients only that is rehabing with PT or OT NOT Home Care that covers house cleaning, meal prep, cooking and opening cans for her. The agency told her that custodial care is what the call Home Care and is only covered by Medicaid.   After a long conversation I advised  Paula Martinez that I want to be sure that she is informed about the difference between the two and do not want her to be disappointed when Home Health comes out and she needs assistance with everyday care that is not covered. I agreed with Paula Martinez that she needs the help and we discussed how frustrating it is to try and work and then not qualify for services when you need it.   On assessment Paula Martinez's home is equipped with a outside ramp and has handicap modifications such as grab bars in the tub. Paula Martinez has a wheelchair in her home.   Functionally Paula Martinez cannot pivot into her tub and stand independently to safely take a shower but has been able to manage a sink bath. She cannot button any buttons or tie her shoes, she cannot open cans to eat or travel to her mailbox to retrieve her mail. Her right side is paralyzed so she cannot sweep her floor or manage her home independently. She recently had her teeth pulled so now she cannot use her teeth to prep her food effectively.  She can arrange and utilize [facility] for her appointments. 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 She typically wears clothing with elastic waistbands, pullover shirts and slippers to accommodate for needs.   Her current plan is to reapply for Medicaid to try to get the Home Care services that she needs. We discussed that if  she is denied she can always call the appeals number on the back of the decision letter to request an appeal. Paula Martinez stated she never knew she could appeal the decision.  Currently she feels that even if she can do Home PT and OT to relearn how to step down, walk short distances, safety queues in the bathroom and exercises on her upper and lower extremity. She is also interested in learning how to walk with an assistive device.

## 2017-07-19 ENCOUNTER — Encounter: Payer: Self-pay | Admitting: *Deleted

## 2017-07-27 ENCOUNTER — Ambulatory Visit (INDEPENDENT_AMBULATORY_CARE_PROVIDER_SITE_OTHER): Payer: Medicare Other | Admitting: Family Medicine

## 2017-07-27 ENCOUNTER — Encounter: Payer: Self-pay | Admitting: Family Medicine

## 2017-07-27 ENCOUNTER — Other Ambulatory Visit: Payer: Self-pay

## 2017-07-27 VITALS — BP 104/65 | HR 72 | Temp 98.4°F | Ht 67.0 in | Wt 172.0 lb

## 2017-07-27 DIAGNOSIS — F411 Generalized anxiety disorder: Secondary | ICD-10-CM

## 2017-07-27 DIAGNOSIS — G8191 Hemiplegia, unspecified affecting right dominant side: Secondary | ICD-10-CM | POA: Diagnosis not present

## 2017-07-27 MED ORDER — SERTRALINE HCL 50 MG PO TABS: 50.0000 mg | ORAL_TABLET | Freq: Every day | ORAL | 3 refills | Status: DC

## 2017-07-27 MED ORDER — BACLOFEN 10 MG PO TABS: 10.0000 mg | ORAL_TABLET | Freq: Three times a day (TID) | ORAL | 0 refills | Status: DC

## 2017-07-27 NOTE — Patient Instructions (Signed)
It was great to meet you today! Thank you for letting me participate in your care!  Today, we discussed anxiety and muscle spasms. I am starting you on Sertraline for your anxiety. I have given you some baclofen as well to help you give some some relief from muscle spasms.  Be well, Paula Rutherford, DO PGY-1, [facility]

## 2017-07-27 NOTE — Assessment & Plan Note (Signed)
Patient is complaining of right sided pain and muscle tightness and muscle cramps.   Prescribing Baclofen 10mg  3 times daily as needed.

## 2017-07-27 NOTE — Progress Notes (Signed)
Subjective: Chief Complaint  Patient presents with  . New Patient (Initial Visit)  . Anxiety  . Pain    HPI: Paula Martinez is a 47 y.o. 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 She relates she is being seen by an Orthopedic doctor for her right ankle pain. She has a Teacher, music who she states she will see in two weeks.   Anxiety GAD-7 score today is 15. She does see a Teacher, music on a regular basis. Patient expresses she is having severe uncontrolled anxiety and made worse when out in public. Patient is adamant that she should receive Klonopin as "I know me and this is the only thing that works". She states she has "tried all the other anxiety medications" and is not interested in trying anything else. She states cognitive behavioral therapy does not work for her.  Review of Systems  Constitutional: Negative for chills, diaphoresis and fever.  HENT: Negative for congestion and sinus pain.   Eyes: Negative for blurred vision and double vision.  Respiratory: Negative for cough, shortness of breath and wheezing.   Cardiovascular: Negative for chest pain and leg swelling.  Gastrointestinal: Positive for constipation. Negative for abdominal pain, diarrhea, nausea and vomiting.  Musculoskeletal: Positive for back pain, joint pain and myalgias.  Skin: Negative for rash.  Neurological: Positive for headaches.  Psychiatric/Behavioral: Negative for depression. The patient is nervous/anxious.    Health Maintenance: none today     ROS noted in HPI.   Past Medical, Surgical, Social, and Family History Reviewed & Updated per EMR.   Pertinent Historical Findings include:   Social History   Tobacco  Use  Smoking Status Current Every Day Smoker  . Packs/day: 0.20  . Types: Cigarettes  Smokeless Tobacco Never Used    Objective: BP 104/65   Pulse 72   Temp 98.4 F (36.9 C) (Oral)   Ht 5\' 7"  (1.702 m)   Wt 172 lb (78 kg)   LMP 06/27/2017   BMI 26.94 kg/m  Vitals and nursing notes reviewed  Physical Exam  Constitutional: She is oriented to person, place, and time. She appears well-developed and well-nourished. No distress.  HENT:  Head: Normocephalic and atraumatic.  Eyes: Pupils are equal, round, and reactive to light. Conjunctivae and EOM are normal.  Neck: Normal range of motion.  Abdominal: Soft.  Musculoskeletal: She exhibits tenderness. She exhibits no edema or deformity.  Right sided hemiplegia, right arm contracture. Wheelchair bound.  Neurological: She is alert and oriented to person, place, and time.  Skin: Skin is warm and dry. No erythema.  Psychiatric:  Extreme anxiousness, mild pressured speech    No results found for this or any previous visit (from the past 72 hour(s)).  Assessment/Plan:  Hemiplegia ([place]) Patient is complaining of right sided pain and muscle tightness and muscle cramps.   Prescribing Baclofen 10mg  3 times daily as needed.  Anxiety state Had long and honest discussion with patient about which medications I was and was not willing to prescribe to her. In my opinion she does have uncontrolled anxiety but unclear if it is generalized or due to recent stress and trauma from difficult family situation.   Her reluctance to try first line SSRIs has me very concerned about drug seeking behavior.  Ultimately, she did agree to try Zoloft. Started at 50mg  and will titrate up as needed. I was very clear with her Klonopin would not be given today and I do not plan on prescribing it for her as it is not good care for long term anxiety.  I would greatly appreciate Psych recommendations and medication management with her anxiety as she named Prozac,  Celexa, and Buspirone as failed therapies as well as CBT. She states she has an appointment with Psych in two weeks.   PATIENT EDUCATION PROVIDED: See AVS    Diagnosis and plan along with any newly prescribed medication(s) were discussed in detail with this patient today. The patient verbalized understanding and agreed with the plan. Patient advised if symptoms worsen return to clinic or ER.   Health Maintainance:   Orders Placed This Encounter  Procedures  . Ambulatory referral to Physical Therapy    Referral Priority:   Routine    Referral Type:   Physical Medicine    Referral Reason:   Specialty Services Required    Requested Specialty:   Physical Therapy    Number of Visits Requested:   1    Meds ordered this encounter  Medications  . baclofen (LIORESAL) 10 MG tablet    Sig: Take 1 tablet (10 mg total) by mouth 3 (three) times daily.    Dispense:  30 each    Refill:  0  . sertraline (ZOLOFT) 50 MG tablet    Sig: Take 1 tablet (50 mg total) by mouth daily.    Dispense:  30 tablet    Refill:  [place], DO 07/27/2017, 9:38 AM PGY-1, [place]

## 2017-07-27 NOTE — Assessment & Plan Note (Signed)
Had long and honest discussion with patient about which medications I was and was not willing to prescribe to her. In my opinion she does have uncontrolled anxiety but unclear if it is generalized or due to recent stress and trauma from difficult family situation.   Her reluctance to try first line SSRIs has me very concerned about drug seeking behavior. Ultimately, she did agree to try Zoloft. Started at 50mg  and will titrate up as needed. I was very clear with her Klonopin would not be given today and I do not plan on prescribing it for her as it is not good care for long term anxiety.  I would greatly appreciate Psych recommendations and medication management with her anxiety as she named Prozac, Celexa, and Buspirone as failed therapies as well as CBT. She states she has an appointment with Psych in two weeks.

## 2017-08-04 NOTE — Progress Notes (Signed)
Thanks Dr Tommy Medal! My number is [phone]. Looking forward to your call

## 2017-08-08 ENCOUNTER — Encounter: Payer: Self-pay | Admitting: *Deleted

## 2017-08-08 NOTE — Progress Notes (Signed)
Received an order from Dr Tommy Medal approving a home health evaluation for PT and OT. Contacted [facility] and they currently have the availability for PT and OT referrals. Created a written orders for PT and OT evaluations to include assessing the need for a home health aide while the patient is rehabilitating with PT or OT services. The written order has been sent to [facility] along with the last 3 visits notes that state the need for PT/OT, demographics and insurance coverage. Confirmation received via fax.

## 2017-08-09 ENCOUNTER — Telehealth: Payer: Self-pay | Admitting: *Deleted

## 2017-08-09 NOTE — Telephone Encounter (Signed)
Received a call from [facility] PT/OT manager wanting to confirm that Dr Tommy Medal will sign the home health 485(orders) if a PT/OT need is identified. She stated typically Dr Tommy Medal does not agree to sign home health orders which means they cannot get paid for services already rendered.  I confirmed that typically Dr Tommy Medal would refer back to the primary and this is a one time courtesy to the patient since her mobility concerns are related condition to a condition that he treats (PML). I advised the manager that Dr Tommy Medal has agreed to sign the 1st 90 day 485(home health orders) but it services need to continue she may want to have the primary provider sign the orders to continue care past the 1st 90 days. I offered my services in the future if she encounters any problems.

## 2017-08-28 ENCOUNTER — Ambulatory Visit: Payer: Medicare Other | Admitting: Family Medicine

## 2017-08-31 ENCOUNTER — Telehealth: Payer: Self-pay

## 2017-08-31 DIAGNOSIS — A812 Progressive multifocal leukoencephalopathy: Secondary | ICD-10-CM

## 2017-08-31 NOTE — Telephone Encounter (Addendum)
Received a call from [facility] requesting an order be sent to [facility] Fax: [phone] from Dr. Davina Poke from [place] stated the orders are for a new manuel wheel chair with leg rest for a pt who is Height: 5'7" weight: 173 lbs. Would also like an order for pt to receive a Hammy walker. Flor will f/u with [place] next week to see if orders have been sent to [facility]. If MD has any questions he can reach [place] at [phone].  [place]

## 2017-09-04 ENCOUNTER — Other Ambulatory Visit: Payer: Self-pay | Admitting: Infectious Disease

## 2017-09-04 ENCOUNTER — Other Ambulatory Visit: Payer: Self-pay | Admitting: Family Medicine

## 2017-09-04 DIAGNOSIS — Z79899 Other long term (current) drug therapy: Secondary | ICD-10-CM

## 2017-09-04 DIAGNOSIS — I69851 Hemiplegia and hemiparesis following other cerebrovascular disease affecting right dominant side: Secondary | ICD-10-CM

## 2017-09-04 DIAGNOSIS — N926 Irregular menstruation, unspecified: Secondary | ICD-10-CM

## 2017-09-04 DIAGNOSIS — Z113 Encounter for screening for infections with a predominantly sexual mode of transmission: Secondary | ICD-10-CM

## 2017-09-04 DIAGNOSIS — B20 Human immunodeficiency virus [HIV] disease: Secondary | ICD-10-CM

## 2017-09-04 DIAGNOSIS — F4310 Post-traumatic stress disorder, unspecified: Secondary | ICD-10-CM

## 2017-09-04 DIAGNOSIS — F419 Anxiety disorder, unspecified: Secondary | ICD-10-CM

## 2017-09-04 DIAGNOSIS — A812 Progressive multifocal leukoencephalopathy: Secondary | ICD-10-CM

## 2017-09-05 NOTE — Addendum Note (Signed)
Addended by: Laverle Patter on: 09/05/2017 12:33 PM   Modules accepted: Orders

## 2017-09-05 NOTE — Telephone Encounter (Signed)
Call from [place] with [facility].  She is requesting f/u information regarding  Paula Martinez wheel chair requested on 08-31-17.   Please call  [phone] with update on information .   Laverle Patter, RN

## 2017-09-05 NOTE — Telephone Encounter (Signed)
And everything in my inbox from [facility] including forms for this lady

## 2017-09-07 ENCOUNTER — Other Ambulatory Visit: Payer: Self-pay | Admitting: Orthopedic Surgery

## 2017-09-12 ENCOUNTER — Ambulatory Visit: Payer: Medicare (Managed Care) | Admitting: Infectious Disease

## 2017-09-15 ENCOUNTER — Encounter (HOSPITAL_BASED_OUTPATIENT_CLINIC_OR_DEPARTMENT_OTHER): Payer: Self-pay | Admitting: *Deleted

## 2017-09-18 ENCOUNTER — Other Ambulatory Visit: Payer: Self-pay

## 2017-09-18 ENCOUNTER — Encounter (HOSPITAL_BASED_OUTPATIENT_CLINIC_OR_DEPARTMENT_OTHER): Payer: Self-pay | Admitting: *Deleted

## 2017-09-19 ENCOUNTER — Telehealth: Payer: Self-pay

## 2017-09-19 NOTE — Telephone Encounter (Signed)
Doris, Education officer, museum with [facility] is calling for verbal order to visit the patient and assist with paperwork for [employer].   Verbal order given and social worker informed Dr Tommy Medal gave initial order for PT and rehab assessment for services at patient request.    Tamela Oddi was informed Dr Tommy Medal will sign order for wheel chair and social work visit but from this point forward request should be submitted to patient's primary care physician.   Any services related to scheduled surgery should be directed to surgeon.   Laverle Patter, RN

## 2017-09-20 NOTE — Anesthesia Preprocedure Evaluation (Addendum)
Anesthesia Evaluation  Patient identified by MRN, date of birth, ID band Patient awake    Reviewed: Allergy & Precautions, NPO status , Patient's Chart, lab work & pertinent test results  Airway Mallampati: II  TM Distance: >3 FB Neck ROM: Full    Dental no notable dental hx. (+) Dental Advisory Given, Partial Upper, Poor Dentition   Pulmonary Current Smoker, former smoker,    Pulmonary exam normal breath sounds clear to auscultation       Cardiovascular negative cardio ROS Normal cardiovascular exam Rhythm:Regular Rate:Normal     Neuro/Psych Anxiety R UE weakness CVA, Residual Symptoms    GI/Hepatic negative GI ROS,   Endo/Other    Renal/GU      Musculoskeletal   Abdominal   Peds  Hematology   Anesthesia Other Findings   Reproductive/Obstetrics negative OB ROS                           Anesthesia Physical Anesthesia Plan  ASA: II  Anesthesia Plan: General and Regional   Post-op Pain Management:  Regional for Post-op pain   Induction: Intravenous  PONV Risk Score and Plan: Treatment may vary due to age or medical condition, Ondansetron, Dexamethasone and Scopolamine patch - Pre-op  Airway Management Planned: LMA  Additional Equipment:   Intra-op Plan:   Post-operative Plan:   Informed Consent: I have reviewed the patients History and Physical, chart, labs and discussed the procedure including the risks, benefits and alternatives for the proposed anesthesia with the patient or authorized representative who has indicated his/her understanding and acceptance.     Plan Discussed with: CRNA  Anesthesia Plan Comments:         Anesthesia Quick Evaluation

## 2017-09-21 ENCOUNTER — Other Ambulatory Visit: Payer: Self-pay

## 2017-09-21 ENCOUNTER — Ambulatory Visit (HOSPITAL_BASED_OUTPATIENT_CLINIC_OR_DEPARTMENT_OTHER): Payer: Medicare Other | Admitting: Anesthesiology

## 2017-09-21 ENCOUNTER — Ambulatory Visit (HOSPITAL_BASED_OUTPATIENT_CLINIC_OR_DEPARTMENT_OTHER)
Admission: RE | Admit: 2017-09-21 | Discharge: 2017-09-21 | Disposition: A | Discharge status: Home / Self Care | Payer: Medicare Other | Source: Ambulatory Visit | Attending: Orthopedic Surgery | Admitting: Orthopedic Surgery

## 2017-09-21 ENCOUNTER — Encounter (HOSPITAL_BASED_OUTPATIENT_CLINIC_OR_DEPARTMENT_OTHER): Payer: Self-pay | Admitting: Anesthesiology

## 2017-09-21 ENCOUNTER — Telehealth: Payer: Self-pay

## 2017-09-21 ENCOUNTER — Encounter (HOSPITAL_BASED_OUTPATIENT_CLINIC_OR_DEPARTMENT_OTHER)
Admission: RE | Disposition: A | Discharge status: Home / Self Care | Payer: Self-pay | Source: Ambulatory Visit | Attending: Orthopedic Surgery

## 2017-09-21 DIAGNOSIS — M205X1 Other deformities of toe(s) (acquired), right foot: Secondary | ICD-10-CM | POA: Diagnosis not present

## 2017-09-21 DIAGNOSIS — B20 Human immunodeficiency virus [HIV] disease: Secondary | ICD-10-CM | POA: Insufficient documentation

## 2017-09-21 DIAGNOSIS — Z87891 Personal history of nicotine dependence: Secondary | ICD-10-CM | POA: Diagnosis not present

## 2017-09-21 DIAGNOSIS — F419 Anxiety disorder, unspecified: Secondary | ICD-10-CM | POA: Insufficient documentation

## 2017-09-21 DIAGNOSIS — I69351 Hemiplegia and hemiparesis following cerebral infarction affecting right dominant side: Secondary | ICD-10-CM | POA: Insufficient documentation

## 2017-09-21 DIAGNOSIS — A812 Progressive multifocal leukoencephalopathy: Secondary | ICD-10-CM | POA: Insufficient documentation

## 2017-09-21 DIAGNOSIS — F4310 Post-traumatic stress disorder, unspecified: Secondary | ICD-10-CM | POA: Insufficient documentation

## 2017-09-21 DIAGNOSIS — M2041 Other hammer toe(s) (acquired), right foot: Secondary | ICD-10-CM | POA: Diagnosis not present

## 2017-09-21 DIAGNOSIS — Z91410 Personal history of adult physical and sexual abuse: Secondary | ICD-10-CM | POA: Insufficient documentation

## 2017-09-21 DIAGNOSIS — Z590 Homelessness: Secondary | ICD-10-CM | POA: Insufficient documentation

## 2017-09-21 HISTORY — DX: Cerebral infarction, unspecified: I63.9

## 2017-09-21 HISTORY — PX: BUNIONECTOMY WITH WEIL OSTEOTOMY: SHX5604

## 2017-09-21 HISTORY — DX: Progressive multifocal leukoencephalopathy: A81.2

## 2017-09-21 LAB — POCT PREGNANCY, URINE: Preg Test, Ur: NEGATIVE

## 2017-09-21 SURGERY — BUNIONECTOMY WITH WEIL OSTEOTOMY
Anesthesia: Regional | Site: Foot | Laterality: Right

## 2017-09-21 MED ORDER — HYDROMORPHONE HCL 1 MG/ML IJ SOLN
0.2500 mg | INTRAMUSCULAR | Status: DC | PRN
Start: 2017-09-21 — End: 2017-09-21

## 2017-09-21 MED ORDER — PROPOFOL 10 MG/ML IV BOLUS
INTRAVENOUS | Status: DC | PRN
Start: 2017-09-21 — End: 2017-09-21
  Administered 2017-09-21: 100 mg via INTRAVENOUS
  Administered 2017-09-21: 30 mg via INTRAVENOUS
  Administered 2017-09-21: 180 mg via INTRAVENOUS

## 2017-09-21 MED ORDER — MEPERIDINE HCL 25 MG/ML IJ SOLN
6.2500 mg | INTRAMUSCULAR | Status: DC | PRN
Start: 2017-09-21 — End: 2017-09-21

## 2017-09-21 MED ORDER — PROMETHAZINE HCL 25 MG/ML IJ SOLN: 6.2500 mg | INTRAMUSCULAR | Status: DC | PRN

## 2017-09-21 MED ORDER — LIDOCAINE HCL (CARDIAC) PF 100 MG/5ML IV SOSY
PREFILLED_SYRINGE | INTRAVENOUS | Status: DC | PRN
  Administered 2017-09-21: 50 mg via INTRAVENOUS

## 2017-09-21 MED ORDER — HYDROCODONE-ACETAMINOPHEN 7.5-325 MG PO TABS: 1.0000 | ORAL_TABLET | Freq: Once | ORAL | Status: DC | PRN

## 2017-09-21 MED ORDER — PROPOFOL 10 MG/ML IV BOLUS
INTRAVENOUS | Status: AC
  Filled 2017-09-21: qty 20

## 2017-09-21 MED ORDER — MIDAZOLAM HCL 2 MG/2ML IJ SOLN
1.0000 mg | INTRAMUSCULAR | Status: DC | PRN
  Administered 2017-09-21: 1 mg via INTRAVENOUS

## 2017-09-21 MED ORDER — ROPIVACAINE HCL 5 MG/ML IJ SOLN
INTRAMUSCULAR | Status: DC | PRN
  Administered 2017-09-21: 30 mL via PERINEURAL

## 2017-09-21 MED ORDER — ACETAMINOPHEN 10 MG/ML IV SOLN: 1000.0000 mg | Freq: Once | INTRAVENOUS | Status: DC | PRN

## 2017-09-21 MED ORDER — DEXAMETHASONE SODIUM PHOSPHATE 10 MG/ML IJ SOLN
INTRAMUSCULAR | Status: DC | PRN
  Administered 2017-09-21: 10 mg via INTRAVENOUS

## 2017-09-21 MED ORDER — MIDAZOLAM HCL 2 MG/2ML IJ SOLN
INTRAMUSCULAR | Status: AC
  Filled 2017-09-21: qty 2

## 2017-09-21 MED ORDER — LACTATED RINGERS IV SOLN
INTRAVENOUS | Status: DC
  Administered 2017-09-21 (×2): via INTRAVENOUS

## 2017-09-21 MED ORDER — CHLORHEXIDINE GLUCONATE 4 % EX LIQD: 60.0000 mL | Freq: Once | CUTANEOUS | Status: DC

## 2017-09-21 MED ORDER — SCOPOLAMINE 1 MG/3DAYS TD PT72: 1.0000 | MEDICATED_PATCH | Freq: Once | TRANSDERMAL | Status: DC | PRN

## 2017-09-21 MED ORDER — HYDROCODONE-ACETAMINOPHEN 5-325 MG PO TABS: 1.0000 | ORAL_TABLET | Freq: Four times a day (QID) | ORAL | 0 refills | Status: DC | PRN

## 2017-09-21 MED ORDER — ONDANSETRON HCL 4 MG/2ML IJ SOLN
INTRAMUSCULAR | Status: DC | PRN
  Administered 2017-09-21: 4 mg via INTRAVENOUS

## 2017-09-21 MED ORDER — FENTANYL CITRATE (PF) 100 MCG/2ML IJ SOLN
INTRAMUSCULAR | Status: AC
  Filled 2017-09-21: qty 2

## 2017-09-21 MED ORDER — SODIUM CHLORIDE 0.9 % IV SOLN: INTRAVENOUS | Status: DC

## 2017-09-21 MED ORDER — EPHEDRINE SULFATE 50 MG/ML IJ SOLN
INTRAMUSCULAR | Status: DC | PRN
  Administered 2017-09-21: 10 mg via INTRAVENOUS

## 2017-09-21 MED ORDER — FENTANYL CITRATE (PF) 100 MCG/2ML IJ SOLN
50.0000 ug | INTRAMUSCULAR | Status: AC | PRN
  Administered 2017-09-21: 50 ug via INTRAVENOUS
  Administered 2017-09-21: 100 ug via INTRAVENOUS
  Administered 2017-09-21: 50 ug via INTRAVENOUS

## 2017-09-21 MED ORDER — CEFAZOLIN SODIUM-DEXTROSE 2-4 GM/100ML-% IV SOLN
2.0000 g | INTRAVENOUS | Status: AC
  Administered 2017-09-21: 2 g via INTRAVENOUS

## 2017-09-21 MED ORDER — CEFAZOLIN SODIUM 1 G IJ SOLR
INTRAMUSCULAR | Status: AC
  Filled 2017-09-21: qty 20

## 2017-09-21 SURGICAL SUPPLY — 79 items
BANDAGE ACE 4X5 VEL STRL LF (GAUZE/BANDAGES/DRESSINGS) ×2 IMPLANT
BANDAGE ESMARK 6X9 LF (GAUZE/BANDAGES/DRESSINGS) IMPLANT
BIT DRILL CANN 2.4 (BIT) ×2
BIT DRILL CANN MAX VPC 2.4 (BIT) ×1 IMPLANT
BLADE AVERAGE 25X9 (BLADE) IMPLANT
BLADE LONG MED 25X9 (BLADE) ×2 IMPLANT
BLADE MICRO SAGITTAL (BLADE) IMPLANT
BLADE OSC/SAG .038X5.5 CUT EDG (BLADE) ×2 IMPLANT
BLADE SURG 15 STRL LF DISP TIS (BLADE) ×3 IMPLANT
BLADE SURG 15 STRL SS (BLADE) ×3
BNDG COHESIVE 4X5 TAN STRL (GAUZE/BANDAGES/DRESSINGS) IMPLANT
BNDG COHESIVE 6X5 TAN STRL LF (GAUZE/BANDAGES/DRESSINGS) IMPLANT
BNDG CONFORM 3 STRL LF (GAUZE/BANDAGES/DRESSINGS) ×2 IMPLANT
BNDG ESMARK 4X9 LF (GAUZE/BANDAGES/DRESSINGS) IMPLANT
BNDG ESMARK 6X9 LF (GAUZE/BANDAGES/DRESSINGS)
BOOT STEPPER DURA MED (SOFTGOODS) ×2 IMPLANT
CHLORAPREP W/TINT 26ML (MISCELLANEOUS) ×2 IMPLANT
COVER BACK TABLE 60X90IN (DRAPES) ×2 IMPLANT
CUFF TOURNIQUET SINGLE 24IN (TOURNIQUET CUFF) IMPLANT
CUFF TOURNIQUET SINGLE 34IN LL (TOURNIQUET CUFF) ×2 IMPLANT
DRAPE EXTREMITY T 121X128X90 (DRAPE) ×2 IMPLANT
DRAPE OEC MINIVIEW 54X84 (DRAPES) ×2 IMPLANT
DRAPE U-SHAPE 47X51 STRL (DRAPES) ×2 IMPLANT
DRSG MEPITEL 4X7.2 (GAUZE/BANDAGES/DRESSINGS) ×2 IMPLANT
DRSG PAD ABDOMINAL 8X10 ST (GAUZE/BANDAGES/DRESSINGS) ×2 IMPLANT
ELECT REM PT RETURN 9FT ADLT (ELECTROSURGICAL)
ELECTRODE REM PT RTRN 9FT ADLT (ELECTROSURGICAL) IMPLANT
FORCEPS BIPOLAR SPETZLER 8 1.0 (NEUROSURGERY SUPPLIES) ×2 IMPLANT
GAUZE SPONGE 4X4 12PLY STRL (GAUZE/BANDAGES/DRESSINGS) ×2 IMPLANT
GLOVE BIO SURGEON STRL SZ 6.5 (GLOVE) ×2 IMPLANT
GLOVE BIO SURGEON STRL SZ8 (GLOVE) ×2 IMPLANT
GLOVE BIOGEL PI IND STRL 7.0 (GLOVE) ×2 IMPLANT
GLOVE BIOGEL PI IND STRL 8 (GLOVE) ×1 IMPLANT
GLOVE BIOGEL PI INDICATOR 7.0 (GLOVE) ×2
GLOVE BIOGEL PI INDICATOR 8 (GLOVE) ×1
GLOVE ECLIPSE 8.0 STRL XLNG CF (GLOVE) IMPLANT
GOWN STRL REUS W/ TWL LRG LVL3 (GOWN DISPOSABLE) ×1 IMPLANT
GOWN STRL REUS W/ TWL XL LVL3 (GOWN DISPOSABLE) ×1 IMPLANT
GOWN STRL REUS W/TWL LRG LVL3 (GOWN DISPOSABLE) ×1
GOWN STRL REUS W/TWL XL LVL3 (GOWN DISPOSABLE) ×1
K-WIRE .054X4 (WIRE) IMPLANT
K-WIRE COCR 1.1X105 (WIRE) ×2
K-WIRE COCR 1.4 X 127 (WIRE) ×2
KWIRE COCR 1.1X105 (WIRE) ×1 IMPLANT
KWIRE COCR 1.4 X 127 (WIRE) ×1 IMPLANT
NEEDLE HYPO 22GX1.5 SAFETY (NEEDLE) IMPLANT
NEEDLE HYPO 25X1 1.5 SAFETY (NEEDLE) IMPLANT
NS IRRIG 1000ML POUR BTL (IV SOLUTION) ×2 IMPLANT
PACK BASIN DAY SURGERY FS (CUSTOM PROCEDURE TRAY) ×2 IMPLANT
PAD CAST 4YDX4 CTTN HI CHSV (CAST SUPPLIES) ×1 IMPLANT
PADDING CAST COTTON 4X4 STRL (CAST SUPPLIES) ×1
PADDING CAST COTTON 6X4 STRL (CAST SUPPLIES) IMPLANT
PENCIL BUTTON HOLSTER BLD 10FT (ELECTRODE) ×2 IMPLANT
SANITIZER HAND PURELL 535ML FO (MISCELLANEOUS) ×2 IMPLANT
SCREW CANN MAX VPC 3.4X20 (Screw) ×2 IMPLANT
SCREW VPC 4.0X30MM (Screw) ×1 IMPLANT
SCREW VPS 3.4X20MM (Screw) ×2 IMPLANT
SHEET MEDIUM DRAPE 40X70 STRL (DRAPES) ×2 IMPLANT
SLEEVE SCD COMPRESS KNEE MED (MISCELLANEOUS) ×2 IMPLANT
SPLINT FAST PLASTER 5X30 (CAST SUPPLIES)
SPLINT PLASTER CAST FAST 5X30 (CAST SUPPLIES) IMPLANT
SPONGE LAP 18X18 RF (DISPOSABLE) ×2 IMPLANT
STOCKINETTE 6  STRL (DRAPES) ×1
STOCKINETTE 6 STRL (DRAPES) ×1 IMPLANT
SUCTION FRAZIER HANDLE 10FR (MISCELLANEOUS) ×1
SUCTION TUBE FRAZIER 10FR DISP (MISCELLANEOUS) ×1 IMPLANT
SUT ETHILON 3 0 PS 1 (SUTURE) ×4 IMPLANT
SUT MNCRL AB 3-0 PS2 18 (SUTURE) ×2 IMPLANT
SUT VIC AB 0 SH 27 (SUTURE) ×2 IMPLANT
SUT VIC AB 2-0 SH 27 (SUTURE)
SUT VIC AB 2-0 SH 27XBRD (SUTURE) IMPLANT
SUT VICRYL 0 UR6 27IN ABS (SUTURE) IMPLANT
SYR BULB 3OZ (MISCELLANEOUS) ×2 IMPLANT
SYR CONTROL 10ML LL (SYRINGE) IMPLANT
TOWEL GREEN STERILE FF (TOWEL DISPOSABLE) ×4 IMPLANT
TUBE CONNECTING 20X1/4 (TUBING) ×2 IMPLANT
UNDERPAD 30X30 (UNDERPADS AND DIAPERS) ×2 IMPLANT
VPC SCREW 4.0X30MM (Screw) ×2 IMPLANT
rattler cannulated interference screw (Screw) ×2 IMPLANT

## 2017-09-21 NOTE — Transfer of Care (Signed)
Immediate Anesthesia Transfer of Care Note  Patient: Paula Martinez  Procedure(s) Performed: Right EHL tendon debridement; Jones procedure; right 2-3 Weil osteotomy and hammertoe corrections (Right )  Patient Location: PACU  Anesthesia Type:GA combined with regional for post-op pain  Level of Consciousness: awake, alert  and oriented  Airway & Oxygen Therapy: Patient Spontanous Breathing and Patient connected to face mask oxygen  Post-op Assessment: Report given to RN and Post -op Vital signs reviewed and stable  Post vital signs: Reviewed and stable  Last Vitals:  Vitals Value Taken Time  BP 81/64 09/21/2017  3:51 PM  Temp    Pulse 80 09/21/2017  3:54 PM  Resp 17 09/21/2017  3:54 PM  SpO2 100 % 09/21/2017  3:54 PM  Vitals shown include unvalidated device data.  Last Pain:  Vitals:   09/21/17 1230  TempSrc: Oral  PainSc: 0-No pain         Complications: No apparent anesthesia complications

## 2017-09-21 NOTE — Progress Notes (Signed)
Assisted Dr. Houser with right, ultrasound guided, popliteal block. Side rails up, monitors on throughout procedure. See vital signs in flow sheet. Tolerated Procedure well. 
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
## 2017-09-21 NOTE — Discharge Instructions (Addendum)
Post Anesthesia Home Care Instructions  Activity: Get plenty of rest for the remainder of the day. A responsible individual must stay with you for 24 hours following the procedure.  For the next 24 hours, DO NOT: -Drive a car -Paediatric nurse -Drink alcoholic beverages -Take any medication unless instructed by your physician -Make any legal decisions or sign important papers.  Meals: Start with liquid foods such as gelatin or soup. Progress to regular foods as tolerated. Avoid greasy, spicy, heavy foods. If nausea and/or vomiting occur, drink only clear liquids until the nausea and/or vomiting subsides. Call your physician if vomiting continues.  Special Instructions/Symptoms: Your throat may feel dry or sore from the anesthesia or the breathing tube placed in your throat during surgery. If this causes discomfort, gargle with warm salt water. The discomfort should disappear within 24 hours.  If you had a scopolamine patch placed behind your ear for the management of post- operative nausea and/or vomiting:  1. The medication in the patch is effective for 72 hours, after which it should be removed.  Wrap patch in a tissue and discard in the trash. Wash hands thoroughly with soap and water. 2. You may remove the patch earlier than 72 hours if you experience unpleasant side effects which may include dry mouth, dizziness or visual disturbances. 3. Avoid touching the patch. Wash your hands with soap and water after contact with the patch.  Wylene Simmer, MD [place]  Please read the following information regarding your care after surgery.  Medications  You only need a prescription for the narcotic pain medicine (ex. oxycodone, Percocet, Norco).  All of the other medicines listed below are available over the counter. X Aleve 2 pills twice a day for the first 3 days after surgery. ? acetominophen (Tylenol) 650 mg every 4-6 hours as you need for minor to moderate pain X   hydrocodonecodone as prescribed for severe pain  Narcotic pain medicine (ex. oxycodone, Percocet, Vicodin) will cause constipation.  To prevent this problem, take the following medicines while you are taking any pain medicine. ? docusate sodium (Colace) 100 mg twice a day ? senna (Senokot) 2 tablets twice a day  Weight Bearing X Bear weight when you are able on your operated leg or foot in the cam boot.  Do not remove the cam boot until you return for your first post op visit. ? Bear weight only on your operated foot in the post-op shoe. ? Do not bear any weight on the operated leg or foot.  Cast / Splint / Dressing X Keep your splint, cast or dressing clean and dry.  Dont put anything (coat hanger, pencil, etc) down inside of it.  If it gets damp, use a hair dryer on the cool setting to dry it.  If it gets soaked, call the office to schedule an appointment for a cast change. ? Remove your dressing 3 days after surgery and cover the incisions with dry dressings.    After your dressing, cast or splint is removed; you may shower, but do not soak or scrub the wound.  Allow the water to run over it, and then gently pat it dry.  Swelling It is normal for you to have swelling where you had surgery.  To reduce swelling and pain, keep your toes above your nose for at least 3 days after surgery.  It may be necessary to keep your foot or leg elevated for several weeks.  If it hurts, it should be elevated.  Follow Up Call my office at [phone] when you are discharged from the hospital or surgery center to schedule an appointment to be seen two weeks after surgery.  Call my office at [phone] if you develop a fever >101.5 F, nausea, vomiting, bleeding from the surgical site or severe pain.

## 2017-09-21 NOTE — Anesthesia Procedure Notes (Signed)
Procedure Name: LMA Insertion Date/Time: 09/21/2017 2:11 PM Performed by: Eulas Post, Nuh Li[place] W, CRNA Pre-anesthesia Checklist: Patient identified, Emergency Drugs available, Suction available and Patient being monitored Patient Re-evaluated:Patient Re-evaluated prior to induction Oxygen Delivery Method: Circle system utilized Preoxygenation: Pre-oxygenation with 100% oxygen Induction Type: IV induction Ventilation: Mask ventilation without difficulty LMA: LMA inserted LMA Size: 4.0 Number of attempts: 1 Placement Confirmation: positive ETCO2 and breath sounds checked- equal and bilateral Tube secured with: Tape Dental Injury: Teeth and Oropharynx as per pre-operative assessment

## 2017-09-21 NOTE — Anesthesia Postprocedure Evaluation (Signed)
Anesthesia Post Note  Patient: [name] Cissell  Procedure(s) Performed: Right EHL tendon debridement; Jones procedure; right 2-3 Weil osteotomy and hammertoe corrections (Right Foot)     Patient location during evaluation: PACU Anesthesia Type: Regional and General Level of consciousness: awake and alert Pain management: pain level controlled Vital Signs Assessment: post-procedure vital signs reviewed and stable Respiratory status: spontaneous breathing, nonlabored ventilation, respiratory function stable and patient connected to nasal cannula oxygen Cardiovascular status: blood pressure returned to baseline and stable Postop Assessment: no apparent nausea or vomiting Anesthetic complications: no    Last Vitals:  Vitals:   09/21/17 1630 09/21/17 1718  BP: 124/77   Pulse: 62 (!) 58  Resp: 13 18  Temp:  36.8 C  SpO2: 100% 100%    Last Pain:  Vitals:   09/21/17 1230  TempSrc: Oral  PainSc: 0-No pain                 Barnet Glasgow

## 2017-09-21 NOTE — Op Note (Signed)
09/21/2017  3:49 PM  PATIENT:  Paula Martinez  47 y.o. female  PRE-OPERATIVE DIAGNOSIS: 1.  Right hallux claw toe deformity      2.  Right second hammertoe deformity      3.  Right third hammertoe deformity  POST-OPERATIVE DIAGNOSIS: Same  Procedure(s): 1.  Right hallux interphalangeal joint arthrodesis with transfer of the extensor hallucis longus tendon to the first metatarsal neck (Jones procedure) 2.  Right second and third toe percutaneous tenotomy of the flexor digitorum longus tendon through separate incisions 3.  Right second and third hammertoe corrections through separate incisions 4.  Right foot AP and lateral radiographs  SURGEON:  Wylene Simmer, MD  ASSISTANT: None ANESTHESIA:   General, regional  EBL:  minimal   TOURNIQUET:   Total Tourniquet Time Documented: Thigh (Right) - 60 minutes Total: Thigh (Right) - 60 minutes  COMPLICATIONS:  None apparent  DISPOSITION:  Extubated, awake and stable to recovery.  INDICATION FOR PROCEDURE: The patient is a 47 year old female with a past medical history significant for hepatitis C and HIV.  She also has hemiplegia on the right side.  She has developed a claw toe deformity of her hallux as well as second and third hammertoe deformities that are painful.  She has failed nonoperative treatment including activity modification, oral anti-inflammatories and shoewear modification.  She presents now for surgical treatment of this painful condition.  The risks and benefits of the alternative treatment options have been discussed in detail.  The patient wishes to proceed with surgery and specifically understands risks of bleeding, infection, nerve damage, blood clots, need for additional surgery, amputation and death.   PROCEDURE IN DETAIL:  After pre operative consent was obtained, and the correct operative site was identified, the patient was brought to the operating room and placed supine on the OR table.  Anesthesia was administered.   Pre-operative antibiotics were administered.  A surgical timeout was taken.  The right lower extremity was prepped and draped in standard sterile fashion with a tourniquet around the thigh.  The extremity was elevated and the tourniquet was inflated to 250 mmHg.  A longitudinal incision was then made over the extensor houses longus tendon.  Dissection was carried down through the subtenons tissues.  The tendon was isolated and released along its length adjacent to the proximal phalanx.  A transverse incision was then made over the IP joint.  The EHL was transected and withdrawn through the proximal incision.  An oscillating saw was then used to resect the articular cartilage and subchondral bone from both sides of the IP joint.  The joint was reduced and a K wire was inserted for a 4 mm Biomet VPC screw.  Radiograph confirmed appropriate reduction of the joint and alignment of the pin.  The pin was then utilized to insert a 4 mm screw.  This compressed the joint appropriately and had excellent purchase.  The K wire was then inserted in the metatarsal neck.  This was overdrilled with a 4.2 mm drill.  A whipstitch was placed in the end of the EHL tendon.  It was passed into the drill hole with a Keith needle and tightened appropriately.  A 3 mm Biomet Rattler interference screw was inserted and was noted to have excellent purchase.  The K wire was removed.  The wounds were irrigated and closed with nylon.  Attention was turned to the second toe.  A percutaneous flexor digitorum longus tenotomy was performed at the distal flexion crease for the second  and third toes through separate incisions.  The toe was then passively correct to a neutral position.  A transverse incision was then made over the second toe PIP joint.  Dissection was carried down through the subcutaneous tissues and extensor mechanism.  The oscillating saw was used to resect the head of the proximal phalanx and the base of the middle phalanx.   The joint was reduced and fixed with a 3.4 mm Biomet VPC screw.  Same procedure was then performed for the third toe and again fixed with a 3.4 millimeters screw.  Final AP and lateral radiographs confirmed appropriate position and length of all hardware and appropriate correction of the claw toe and hammertoe deformities.    The wounds were irrigated copiously and closed with nylon suture.  Sterile dressings were applied followed by a compression wrap and a cam walker boot.  Tourniquet was released after application of the dressings.  The patient was awakened from anesthesia and transported to the recovery room in stable condition.  FOLLOW UP PLAN: Weightbearing as tolerated on the heel in a cam boot.  Follow-up in 2 weeks for suture removal.   RADIOGRAPHS: AP and lateral radiographs of the right foot are obtained intraoperatively.  These show interval correction of a claw toe deformity at the hallux as well as second and third hammertoe deformities.  Hardware is appropriately positioned and of the appropriate lengths.

## 2017-09-21 NOTE — Anesthesia Procedure Notes (Signed)
Anesthesia Regional Block: Popliteal block   Pre-Anesthetic Checklist: ,, timeout performed, Correct Patient, Correct Site, Correct Laterality, Correct Procedure, Correct Position, site marked, Risks and benefits discussed, pre-op evaluation,  At surgeon's request and post-op pain management  Laterality: Right  Prep: Maximum Sterile Barrier Precautions used, chloraprep       Needles:  Injection technique: Single-shot  Needle Type: Echogenic Needle     Needle Length: 9cm  Needle Gauge: 21     Additional Needles:   Procedures:,,,, ultrasound used (permanent image in chart),,,,  Narrative:  Start time: 09/21/2017 1:28 PM End time: 09/21/2017 1:40 PM Injection made incrementally with aspirations every 5 mL. Anesthesiologist: Barnet Glasgow, MD

## 2017-09-21 NOTE — Telephone Encounter (Signed)
Flor from [facility] called today for a verbal to continue pt's physical therapy for once a week for two weeks. Orland Mustard was able to take a verbal from me and will notify MD that [facility] had called today regarding pt's physical therapy. Also to see if MD would like to make any changes.  Flor at [employer] number [phone]

## 2017-09-22 ENCOUNTER — Telehealth: Payer: Self-pay

## 2017-09-22 ENCOUNTER — Encounter (HOSPITAL_BASED_OUTPATIENT_CLINIC_OR_DEPARTMENT_OTHER): Payer: Self-pay | Admitting: Orthopedic Surgery

## 2017-09-22 NOTE — Telephone Encounter (Signed)
09-22-17 Costella Hatcher, OT with [facility] calling for verbal order for assistance at home after surgery on Thursday on foot.      Left message on voice mail :  Dr Tommy Medal will not be offering orders on future care related to patient's orthopedic or home needs.  These should be directed to operating physician or primary care.    Laverle Patter, RN

## 2017-09-22 NOTE — Telephone Encounter (Signed)
Thanks Luis! 

## 2017-09-27 ENCOUNTER — Ambulatory Visit: Payer: Self-pay | Admitting: *Deleted

## 2017-09-27 DIAGNOSIS — A812 Progressive multifocal leukoencephalopathy: Secondary | ICD-10-CM

## 2017-10-02 ENCOUNTER — Ambulatory Visit: Payer: Medicare (Managed Care) | Admitting: Infectious Diseases

## 2017-10-06 DIAGNOSIS — M2040 Other hammer toe(s) (acquired), unspecified foot: Secondary | ICD-10-CM | POA: Insufficient documentation

## 2017-10-16 ENCOUNTER — Other Ambulatory Visit: Payer: Self-pay | Admitting: Family Medicine

## 2017-10-31 ENCOUNTER — Other Ambulatory Visit: Payer: Self-pay | Admitting: Family Medicine

## 2017-11-02 ENCOUNTER — Other Ambulatory Visit: Payer: Self-pay | Admitting: *Deleted

## 2017-11-08 ENCOUNTER — Ambulatory Visit: Payer: Medicare Other | Admitting: Infectious Diseases

## 2017-11-09 NOTE — Progress Notes (Signed)
Received a call from [facility] stating she really needs some help with getting food. Burman Riis stated she is working very few hours and since she has had surgery on her foot she cannot get to and from the store. Explained to Burman Riis that we may be able to offer assistance with food.  Traveled to [facility] and with a gift card provided by the [facility] we were able to get food for Ms Burman Riis. Also traveled to [facility] and picked up her medications. Food and medications were delivered to her home. Explained to Tobi Bastos that the fund is used for temporary circumstances and cannot be used on a continuous basis. [name] verbalized understanding

## 2017-11-29 ENCOUNTER — Ambulatory Visit (INDEPENDENT_AMBULATORY_CARE_PROVIDER_SITE_OTHER): Payer: Medicare Other | Admitting: Family

## 2017-11-29 ENCOUNTER — Encounter: Payer: Self-pay | Admitting: Family

## 2017-11-29 VITALS — BP 128/77 | HR 60 | Temp 98.9°F | Ht 67.0 in | Wt 181.0 lb

## 2017-11-29 DIAGNOSIS — Z23 Encounter for immunization: Secondary | ICD-10-CM | POA: Diagnosis not present

## 2017-11-29 DIAGNOSIS — B20 Human immunodeficiency virus [HIV] disease: Secondary | ICD-10-CM

## 2017-11-29 NOTE — Patient Instructions (Signed)
Nice to meet you.  Please continue to take your Symtuza as prescribed.  We will check your blood work today.  Please schedule a follow up with Dr. Tommy Medal in 3 months or sooner if needed with additional blood work 1-2 weeks prior to your appointment.  Good luck with your book!
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
## 2017-11-29 NOTE — Assessment & Plan Note (Signed)
[name] appears to have adequately controlled HIV disease with her current regimen of Symtuza. She has no adverse side effects or missed doses. No problems obtaining her medication. No signs/symptoms of opportunistic infection through history or physical exam. No recent substance use per patient, although possible. I am unable to tell based on demeanor as this is the first time I am seeing her. Influenza, Menveo and Prevnar updated today. Check CD4 and viral load. Continue current dose of Symtuza. Plan for follow up office visit in 3 months or sooner if needed with lab work 1-2 weeks prior to appointment.

## 2017-11-30 LAB — T-HELPER CELL (CD4) - (RCID CLINIC ONLY)
CD4 % Helper T Cell: 25 % — ABNORMAL LOW (ref 33–55)
CD4 T CELL ABS: 680 /uL (ref 400–2700)

## 2017-12-02 LAB — HIV-1 RNA QUANT-NO REFLEX-BLD
HIV 1 RNA QUANT: NOT DETECTED {copies}/mL
HIV-1 RNA Quant, Log: <1.3 Log copies/mL

## 2017-12-02 LAB — COMPREHENSIVE METABOLIC PANEL
AG RATIO: 1.3 (calc) (ref 1.0–2.5)
ALBUMIN MSPROF: 3.9 g/dL (ref 3.6–5.1)
ALKALINE PHOSPHATASE (APISO): 76 U/L (ref 33–115)
ALT: 16 U/L (ref 6–29)
AST: 13 U/L (ref 10–35)
BUN: 11 mg/dL (ref 7–25)
CHLORIDE: 110 mmol/L (ref 98–110)
CO2: 21 mmol/L (ref 20–32)
Calcium: 9.6 mg/dL (ref 8.6–10.2)
Creat: 0.81 mg/dL (ref 0.50–1.10)
GLOBULIN: 2.9 g/dL (ref 1.9–3.7)
GLUCOSE: 80 mg/dL (ref 65–99)
POTASSIUM: 4.3 mmol/L (ref 3.5–5.3)
Sodium: 141 mmol/L (ref 135–146)
Total Bilirubin: 0.5 mg/dL (ref 0.2–1.2)
Total Protein: 6.8 g/dL (ref 6.1–8.1)

## 2017-12-02 LAB — RPR: RPR Ser Ql: NONREACTIVE

## 2017-12-06 ENCOUNTER — Telehealth: Payer: Self-pay | Admitting: Behavioral Health

## 2017-12-06 NOTE — Telephone Encounter (Signed)
Attempted to call [name] Cissell there was no answer and she did not have a voicemail to leave a message. Pricilla Riffle RN

## 2017-12-06 NOTE — Telephone Encounter (Signed)
-----   Message from [place], [place] sent at 12/05/2017 12:28 PM EDT ----- Please inform Paula Martinez that her kidney function, liver function and electrolytes are normal. Her CD4 count was 680 and viral load was undetectable. Continue with follow up in 3 months or sooner if needed.

## 2017-12-11 ENCOUNTER — Telehealth: Payer: Self-pay | Admitting: Family Medicine

## 2018-01-03 ENCOUNTER — Encounter: Payer: Self-pay | Admitting: Infectious Diseases

## 2018-01-22 ENCOUNTER — Other Ambulatory Visit: Payer: Self-pay | Admitting: Family Medicine

## 2018-02-02 ENCOUNTER — Other Ambulatory Visit: Payer: Self-pay | Admitting: Infectious Disease

## 2018-02-02 DIAGNOSIS — A812 Progressive multifocal leukoencephalopathy: Secondary | ICD-10-CM

## 2018-02-02 DIAGNOSIS — F4310 Post-traumatic stress disorder, unspecified: Secondary | ICD-10-CM

## 2018-02-02 DIAGNOSIS — I69851 Hemiplegia and hemiparesis following other cerebrovascular disease affecting right dominant side: Secondary | ICD-10-CM

## 2018-02-02 DIAGNOSIS — N926 Irregular menstruation, unspecified: Secondary | ICD-10-CM

## 2018-02-02 DIAGNOSIS — Z113 Encounter for screening for infections with a predominantly sexual mode of transmission: Secondary | ICD-10-CM

## 2018-02-02 DIAGNOSIS — B20 Human immunodeficiency virus [HIV] disease: Secondary | ICD-10-CM

## 2018-02-02 DIAGNOSIS — F419 Anxiety disorder, unspecified: Secondary | ICD-10-CM

## 2018-02-02 DIAGNOSIS — Z79899 Other long term (current) drug therapy: Secondary | ICD-10-CM

## 2018-02-13 ENCOUNTER — Ambulatory Visit (INDEPENDENT_AMBULATORY_CARE_PROVIDER_SITE_OTHER): Payer: Medicare Other | Admitting: Infectious Disease

## 2018-02-13 VITALS — Temp 98.9°F

## 2018-02-13 DIAGNOSIS — B20 Human immunodeficiency virus [HIV] disease: Secondary | ICD-10-CM

## 2018-02-13 DIAGNOSIS — F419 Anxiety disorder, unspecified: Secondary | ICD-10-CM

## 2018-02-13 DIAGNOSIS — M24571 Contracture, right ankle: Secondary | ICD-10-CM

## 2018-02-13 DIAGNOSIS — I69851 Hemiplegia and hemiparesis following other cerebrovascular disease affecting right dominant side: Secondary | ICD-10-CM

## 2018-02-13 DIAGNOSIS — F4310 Post-traumatic stress disorder, unspecified: Secondary | ICD-10-CM

## 2018-02-13 DIAGNOSIS — A812 Progressive multifocal leukoencephalopathy: Secondary | ICD-10-CM

## 2018-02-13 DIAGNOSIS — Z113 Encounter for screening for infections with a predominantly sexual mode of transmission: Secondary | ICD-10-CM

## 2018-02-13 DIAGNOSIS — Z79899 Other long term (current) drug therapy: Secondary | ICD-10-CM

## 2018-02-13 DIAGNOSIS — M24574 Contracture, right foot: Secondary | ICD-10-CM

## 2018-02-13 DIAGNOSIS — N926 Irregular menstruation, unspecified: Secondary | ICD-10-CM

## 2018-02-13 DIAGNOSIS — Z599 Problem related to housing and economic circumstances, unspecified: Secondary | ICD-10-CM

## 2018-02-13 MED ORDER — TRAZODONE HCL 50 MG PO TABS: 50.0000 mg | ORAL_TABLET | Freq: Every day | ORAL | 11 refills | Status: DC

## 2018-02-13 MED ORDER — DARUN-COBIC-EMTRICIT-TENOFAF 800-150-200-10 MG PO TABS: 1.0000 | ORAL_TABLET | Freq: Every day | ORAL | 11 refills | Status: DC
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
## 2018-03-13 ENCOUNTER — Other Ambulatory Visit: Payer: Self-pay | Admitting: Family Medicine

## 2018-04-23 ENCOUNTER — Ambulatory Visit: Payer: Medicare Other | Admitting: Infectious Diseases

## 2018-04-24 ENCOUNTER — Ambulatory Visit (INDEPENDENT_AMBULATORY_CARE_PROVIDER_SITE_OTHER): Payer: Medicare Other | Admitting: Infectious Diseases

## 2018-04-24 ENCOUNTER — Other Ambulatory Visit (HOSPITAL_COMMUNITY)
Admission: RE | Admit: 2018-04-24 | Discharge: 2018-04-24 | Disposition: A | Discharge status: Home / Self Care | Payer: Medicare Other | Source: Ambulatory Visit | Attending: Infectious Diseases | Admitting: Infectious Diseases

## 2018-04-24 ENCOUNTER — Encounter: Payer: Self-pay | Admitting: Infectious Diseases

## 2018-04-24 DIAGNOSIS — Z124 Encounter for screening for malignant neoplasm of cervix: Secondary | ICD-10-CM | POA: Insufficient documentation

## 2018-04-24 DIAGNOSIS — Z1231 Encounter for screening mammogram for malignant neoplasm of breast: Secondary | ICD-10-CM | POA: Diagnosis not present

## 2018-04-24 DIAGNOSIS — Z01419 Encounter for gynecological examination (general) (routine) without abnormal findings: Secondary | ICD-10-CM | POA: Diagnosis not present
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
## 2018-04-27 ENCOUNTER — Encounter: Payer: Self-pay | Admitting: Infectious Diseases

## 2018-04-27 ENCOUNTER — Telehealth: Payer: Self-pay

## 2018-04-27 ENCOUNTER — Other Ambulatory Visit: Payer: Self-pay | Admitting: Behavioral Health

## 2018-04-27 LAB — CYTOLOGY - PAP
ADEQUACY: ABSENT
Bacterial vaginitis: POSITIVE — AB
Chlamydia: NEGATIVE
DIAGNOSIS: NEGATIVE
HPV: NOT DETECTED
Neisseria Gonorrhea: NEGATIVE

## 2018-04-27 MED ORDER — METRONIDAZOLE 500 MG PO TABS: 500.0000 mg | ORAL_TABLET | Freq: Two times a day (BID) | ORAL | 0 refills | Status: AC

## 2018-04-27 MED ORDER — METRONIDAZOLE 500 MG PO TABS: 500.0000 mg | ORAL_TABLET | Freq: Two times a day (BID) | ORAL | 0 refills | Status: DC

## 2018-04-27 NOTE — Progress Notes (Signed)
Trichomoniasis also noted - flagyl will treat this adequately; 500mg  BID x7d preferred in HIV+ per 2015 STI guidelines

## 2018-04-27 NOTE — Telephone Encounter (Signed)
[name] returned call, verified idenity. Relayed results, per [name] NP. Patient expressed understanding with no further questions and agreed to follow through with treatment.    [name], CMA    [name], [name], NP  P [facility] Triage Nurse Pool    Please call the patient to let her know that the discharge she has is due to bacterial vaginosis. I will send in a medication she needs to take twice a day with food for 7d to treat this. She should avoid douching or anything in the vagina for 10 days. No alcohol at all while taking this medication and through 48 after last dose please (can cause her to be very sick).   Associated Results    Result Notes for Cytology - PAP( [place])   Notes recorded by [place] Callas, NP on 04/27/2018 at 3:47 PM EST Normal cytology and negative HPV - repeat pap smear in 1 year. ------  Notes recorded by [place] Callas, NP on 04/27/2018 at 3:41 PM EST Trichomoniasis also noted - flagyl will treat this adequately; 500mg  BID x7d preferred in HIV+ per 2015 STI guidelines ------  Notes recorded by [place] Callas, NP on 04/27/2018 at 3:27 PM EST Please call the patient to let her know that the discharge she has is due to bacterial vaginosis. I will send in a medication she needs to take twice a day with food for 7d to treat this. She should avoid douching or anything in the vagina for 10 days. No alcohol at all while taking this medication and through 48 after last dose please (can cause her to be very sick).

## 2018-04-27 NOTE — Telephone Encounter (Signed)
Called patient, per [name] NP, to relay results. [name] didn't answer, and unable to leave a message.   Will try again.

## 2018-04-27 NOTE — Progress Notes (Signed)
Normal cytology and negative HPV - repeat pap smear in 1 year.

## 2018-04-27 NOTE — Telephone Encounter (Signed)
Thank you :)

## 2018-04-27 NOTE — Progress Notes (Signed)
Please call the patient to let her know that the discharge she has is due to bacterial vaginosis. I will send in a medication she needs to take twice a day with food for 7d to treat this. She should avoid douching or anything in the vagina for 10 days. No alcohol at all while taking this medication and through 48 after last dose please (can cause her to be very sick).

## 2018-05-29 ENCOUNTER — Ambulatory Visit: Payer: Medicare Other | Admitting: Infectious Disease

## 2018-06-04 ENCOUNTER — Other Ambulatory Visit: Payer: Self-pay | Admitting: Family Medicine

## 2018-06-12 ENCOUNTER — Ambulatory Visit: Payer: Medicare Other

## 2018-07-04 ENCOUNTER — Ambulatory Visit: Payer: Medicare Other | Admitting: Infectious Disease

## 2018-08-28 ENCOUNTER — Other Ambulatory Visit: Payer: Self-pay | Admitting: Family Medicine

## 2018-12-04 ENCOUNTER — Other Ambulatory Visit: Payer: Medicare Other

## 2018-12-10 ENCOUNTER — Encounter: Payer: Self-pay | Admitting: Infectious Disease

## 2018-12-10 ENCOUNTER — Ambulatory Visit (INDEPENDENT_AMBULATORY_CARE_PROVIDER_SITE_OTHER): Payer: Medicare Other | Admitting: Infectious Disease

## 2018-12-10 ENCOUNTER — Other Ambulatory Visit: Payer: Self-pay

## 2018-12-10 VITALS — BP 125/70 | HR 68 | Temp 98.9°F

## 2018-12-10 DIAGNOSIS — M24571 Contracture, right ankle: Secondary | ICD-10-CM | POA: Diagnosis not present

## 2018-12-10 DIAGNOSIS — Z113 Encounter for screening for infections with a predominantly sexual mode of transmission: Secondary | ICD-10-CM

## 2018-12-10 DIAGNOSIS — B20 Human immunodeficiency virus [HIV] disease: Secondary | ICD-10-CM | POA: Diagnosis not present

## 2018-12-10 DIAGNOSIS — G629 Polyneuropathy, unspecified: Secondary | ICD-10-CM

## 2018-12-10 DIAGNOSIS — Z79899 Other long term (current) drug therapy: Secondary | ICD-10-CM

## 2018-12-10 DIAGNOSIS — Z23 Encounter for immunization: Secondary | ICD-10-CM | POA: Diagnosis not present

## 2018-12-10 DIAGNOSIS — I69851 Hemiplegia and hemiparesis following other cerebrovascular disease affecting right dominant side: Secondary | ICD-10-CM

## 2018-12-10 DIAGNOSIS — F4310 Post-traumatic stress disorder, unspecified: Secondary | ICD-10-CM | POA: Diagnosis not present

## 2018-12-10 DIAGNOSIS — M24574 Contracture, right foot: Secondary | ICD-10-CM

## 2018-12-10 NOTE — Addendum Note (Signed)
Addended by: [name] on: 12/10/2018 03:29 PM   Modules accepted: Orders

## 2018-12-10 NOTE — Addendum Note (Signed)
Addended by: Dolan Amen D on: 12/10/2018 02:49 PM   Modules accepted: Orders
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
## 2018-12-10 NOTE — Patient Instructions (Signed)
Schedule pap clinic with [name] by February, March

## 2018-12-11 LAB — T-HELPER CELL (CD4) - (RCID CLINIC ONLY)
CD4 % Helper T Cell: 34 % (ref 33–65)
CD4 T Cell Abs: 966 /uL (ref 400–1790)

## 2018-12-12 LAB — HIV-1 RNA QUANT-NO REFLEX-BLD
HIV 1 RNA Quant: <20 copies/mL
HIV-1 RNA Quant, Log: <1.3 Log copies/mL

## 2018-12-12 LAB — COMPLETE METABOLIC PANEL WITH GFR
AG Ratio: 1.3 (calc) (ref 1.0–2.5)
ALT: 13 U/L (ref 6–29)
AST: 13 U/L (ref 10–35)
Albumin: 4 g/dL (ref 3.6–5.1)
Alkaline phosphatase (APISO): 75 U/L (ref 31–125)
BUN: 15 mg/dL (ref 7–25)
CO2: 19 mmol/L — ABNORMAL LOW (ref 20–32)
Calcium: 9.5 mg/dL (ref 8.6–10.2)
Chloride: 111 mmol/L — ABNORMAL HIGH (ref 98–110)
Creat: 1.05 mg/dL (ref 0.50–1.10)
GFR, Est African American: 73 mL/min/{1.73_m2} (ref >=60)
GFR, Est Non African American: 63 mL/min/{1.73_m2} (ref >=60)
Globulin: 3 g/dL (calc) (ref 1.9–3.7)
Glucose, Bld: 103 mg/dL — ABNORMAL HIGH (ref 65–99)
Potassium: 3.7 mmol/L (ref 3.5–5.3)
Sodium: 140 mmol/L (ref 135–146)
Total Bilirubin: 0.6 mg/dL (ref 0.2–1.2)
Total Protein: 7 g/dL (ref 6.1–8.1)

## 2018-12-12 LAB — CBC WITH DIFFERENTIAL/PLATELET
Absolute Monocytes: 600 cells/uL (ref 200–950)
Basophils Absolute: 61 cells/uL (ref 0–200)
Basophils Relative: 0.8 %
Eosinophils Absolute: 129 cells/uL (ref 15–500)
Eosinophils Relative: 1.7 %
HCT: 42.4 % (ref 35.0–45.0)
Hemoglobin: 14.5 g/dL (ref 11.7–15.5)
Lymphs Abs: 2835 cells/uL (ref 850–3900)
MCH: 30.3 pg (ref 27.0–33.0)
MCHC: 34.2 g/dL (ref 32.0–36.0)
MCV: 88.5 fL (ref 80.0–100.0)
MPV: 12.3 fL (ref 7.5–12.5)
Monocytes Relative: 7.9 %
Neutro Abs: 3975 cells/uL (ref 1500–7800)
Neutrophils Relative %: 52.3 %
Platelets: 247 10*3/uL (ref 140–400)
RBC: 4.79 10*6/uL (ref 3.80–5.10)
RDW: 12.6 % (ref 11.0–15.0)
Total Lymphocyte: 37.3 %
WBC: 7.6 10*3/uL (ref 3.8–10.8)

## 2018-12-12 LAB — LIPID PANEL
Cholesterol: 138 mg/dL (ref <200)
HDL: 64 mg/dL (ref >=50)
LDL Cholesterol (Calc): 51 mg/dL (calc)
Non-HDL Cholesterol (Calc): 74 mg/dL (calc) (ref <130)
Total CHOL/HDL Ratio: 2.2 (calc) (ref <5.0)
Triglycerides: 157 mg/dL — ABNORMAL HIGH (ref <150)

## 2018-12-12 LAB — HEMOGLOBIN A1C
Hgb A1c MFr Bld: 4.7 % of total Hgb (ref <5.7)
Mean Plasma Glucose: 88 (calc)
eAG (mmol/L): 4.9 (calc)

## 2018-12-12 LAB — RPR: RPR Ser Ql: NONREACTIVE

## 2018-12-14 ENCOUNTER — Other Ambulatory Visit: Payer: Self-pay

## 2018-12-14 ENCOUNTER — Encounter (HOSPITAL_COMMUNITY): Payer: Self-pay

## 2018-12-14 ENCOUNTER — Ambulatory Visit (HOSPITAL_COMMUNITY)
Admission: EM | Admit: 2018-12-14 | Discharge: 2018-12-14 | Disposition: A | Discharge status: Home / Self Care | Payer: Medicare Other | Attending: Family Medicine | Admitting: Family Medicine

## 2018-12-14 DIAGNOSIS — Z3202 Encounter for pregnancy test, result negative: Secondary | ICD-10-CM

## 2018-12-14 DIAGNOSIS — N924 Excessive bleeding in the premenopausal period: Secondary | ICD-10-CM

## 2018-12-14 LAB — POCT PREGNANCY, URINE: Preg Test, Ur: NEGATIVE

## 2018-12-14 MED ORDER — KETOROLAC TROMETHAMINE 30 MG/ML IJ SOLN: 30.0000 mg | Freq: Once | INTRAMUSCULAR | Status: DC

## 2018-12-14 MED ORDER — NAPROXEN 500 MG PO TABS: 500.0000 mg | ORAL_TABLET | Freq: Two times a day (BID) | ORAL | 0 refills | Status: DC

## 2018-12-14 MED ORDER — KETOROLAC TROMETHAMINE 30 MG/ML IJ SOLN
INTRAMUSCULAR | Status: AC
  Filled 2018-12-14: qty 1

## 2018-12-14 NOTE — ED Notes (Signed)
Patient refused to have the ketorolac 30 MG/ML injection 30 mg

## 2018-12-14 NOTE — ED Triage Notes (Signed)
Patient presents to Urgent Care with complaints of groin pain and late menstrual period since yesterday. Patient reports she was two weeks late and when it began her cramping was severe and some blood clots came out of her vagina. Pt states she will need a work note.

## 2018-12-14 NOTE — Discharge Instructions (Signed)
Take the naprosyn 2 x a day as needed to reduce the cramps and bleeding Call your PCP if not improving by Monday
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
## 2018-12-30 ENCOUNTER — Encounter (HOSPITAL_COMMUNITY): Payer: Self-pay | Admitting: Emergency Medicine

## 2018-12-30 ENCOUNTER — Ambulatory Visit (HOSPITAL_COMMUNITY)
Admission: EM | Admit: 2018-12-30 | Discharge: 2018-12-30 | Disposition: A | Discharge status: Home / Self Care | Payer: Medicare Other | Attending: Family | Admitting: Family

## 2018-12-30 ENCOUNTER — Other Ambulatory Visit: Payer: Self-pay

## 2018-12-30 DIAGNOSIS — F411 Generalized anxiety disorder: Secondary | ICD-10-CM | POA: Diagnosis not present

## 2018-12-30 DIAGNOSIS — R062 Wheezing: Secondary | ICD-10-CM

## 2018-12-30 DIAGNOSIS — Z77120 Contact with and (suspected) exposure to mold (toxic): Secondary | ICD-10-CM | POA: Diagnosis not present

## 2018-12-30 DIAGNOSIS — R238 Other skin changes: Secondary | ICD-10-CM

## 2018-12-30 MED ORDER — ALBUTEROL SULFATE HFA 108 (90 BASE) MCG/ACT IN AERS: 2.0000 | INHALATION_SPRAY | Freq: Four times a day (QID) | RESPIRATORY_TRACT | 0 refills | Status: DC | PRN

## 2018-12-30 MED ORDER — HYDROXYZINE PAMOATE 25 MG PO CAPS: 25.0000 mg | ORAL_CAPSULE | Freq: Four times a day (QID) | ORAL | 0 refills | Status: DC | PRN

## 2018-12-30 MED ORDER — MONTELUKAST SODIUM 10 MG PO TABS: 10.0000 mg | ORAL_TABLET | Freq: Every day | ORAL | 0 refills | Status: DC

## 2018-12-30 NOTE — ED Triage Notes (Signed)
Pt c/o wheezing and sob for the last two weeks, states "I think that there is mold in my apartment.". resp e/u

## 2018-12-30 NOTE — Discharge Instructions (Addendum)
Recommend use Albuterol inhaler 2 puffs every 6 hours as needed for wheezing or shortness of breath. May take Vistaril 25mg  every 6 to 8 hours as needed for itching. May also start Singulair 10mg  once daily. Recommend continue to contact your housing manager for evaluation of current living situation. Follow-up with your PCP in 4 to 5 days if symptoms do not improve.
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
## 2019-01-21 ENCOUNTER — Ambulatory Visit: Payer: Medicare Other | Admitting: Allergy and Immunology

## 2019-02-14 ENCOUNTER — Other Ambulatory Visit: Payer: Self-pay | Admitting: Family Medicine

## 2019-02-19 ENCOUNTER — Other Ambulatory Visit: Payer: Self-pay | Admitting: Infectious Disease

## 2019-02-19 DIAGNOSIS — F419 Anxiety disorder, unspecified: Secondary | ICD-10-CM

## 2019-02-19 DIAGNOSIS — Z113 Encounter for screening for infections with a predominantly sexual mode of transmission: Secondary | ICD-10-CM

## 2019-02-19 DIAGNOSIS — Z79899 Other long term (current) drug therapy: Secondary | ICD-10-CM

## 2019-02-19 DIAGNOSIS — B20 Human immunodeficiency virus [HIV] disease: Secondary | ICD-10-CM

## 2019-02-19 DIAGNOSIS — F4310 Post-traumatic stress disorder, unspecified: Secondary | ICD-10-CM

## 2019-02-19 DIAGNOSIS — N926 Irregular menstruation, unspecified: Secondary | ICD-10-CM

## 2019-02-19 DIAGNOSIS — A812 Progressive multifocal leukoencephalopathy: Secondary | ICD-10-CM

## 2019-02-19 DIAGNOSIS — I69851 Hemiplegia and hemiparesis following other cerebrovascular disease affecting right dominant side: Secondary | ICD-10-CM

## 2019-02-28 NOTE — Telephone Encounter (Signed)
Opened in ERROR

## 2019-03-12 ENCOUNTER — Other Ambulatory Visit: Payer: Self-pay | Admitting: Family Medicine

## 2019-03-17 ENCOUNTER — Ambulatory Visit (HOSPITAL_COMMUNITY)
Admission: EM | Admit: 2019-03-17 | Discharge: 2019-03-17 | Disposition: A | Discharge status: Home / Self Care | Payer: Medicare Other | Attending: Physician Assistant | Admitting: Physician Assistant

## 2019-03-17 ENCOUNTER — Encounter (HOSPITAL_COMMUNITY): Payer: Self-pay

## 2019-03-17 ENCOUNTER — Other Ambulatory Visit: Payer: Self-pay

## 2019-03-17 DIAGNOSIS — M940 Chondrocostal junction syndrome [Tietze]: Secondary | ICD-10-CM

## 2019-03-17 DIAGNOSIS — S20219A Contusion of unspecified front wall of thorax, initial encounter: Secondary | ICD-10-CM

## 2019-03-17 MED ORDER — NAPROXEN 500 MG PO TABS: 500.0000 mg | ORAL_TABLET | Freq: Two times a day (BID) | ORAL | 0 refills | Status: DC

## 2019-03-17 MED ORDER — DICLOFENAC SODIUM 1 % EX GEL: 4.0000 g | Freq: Three times a day (TID) | CUTANEOUS | 0 refills | Status: DC

## 2019-03-17 NOTE — Discharge Instructions (Addendum)
Use the voltaren gel 3 times a day  Take naproxen 2 times a day  Ice the area if you feel it helps.  If you are not improving in 1-2 weeks , please follow up with this clinic or with your primary care provider.
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
## 2019-03-17 NOTE — ED Triage Notes (Signed)
Patient presents to Urgent Care with complaints of rib pain on the right side since falling three weeks ago. Patient reports she thinks she broke one of her ribs, pt endorses sob due to the pain.

## 2019-05-06 ENCOUNTER — Other Ambulatory Visit: Payer: Self-pay | Admitting: Infectious Disease

## 2019-05-06 DIAGNOSIS — I69851 Hemiplegia and hemiparesis following other cerebrovascular disease affecting right dominant side: Secondary | ICD-10-CM

## 2019-05-06 DIAGNOSIS — Z79899 Other long term (current) drug therapy: Secondary | ICD-10-CM

## 2019-05-06 DIAGNOSIS — Z113 Encounter for screening for infections with a predominantly sexual mode of transmission: Secondary | ICD-10-CM

## 2019-05-06 DIAGNOSIS — B20 Human immunodeficiency virus [HIV] disease: Secondary | ICD-10-CM

## 2019-05-06 DIAGNOSIS — A812 Progressive multifocal leukoencephalopathy: Secondary | ICD-10-CM

## 2019-05-06 DIAGNOSIS — N926 Irregular menstruation, unspecified: Secondary | ICD-10-CM

## 2019-05-06 DIAGNOSIS — F419 Anxiety disorder, unspecified: Secondary | ICD-10-CM

## 2019-05-06 DIAGNOSIS — F4310 Post-traumatic stress disorder, unspecified: Secondary | ICD-10-CM

## 2019-05-07 ENCOUNTER — Other Ambulatory Visit: Payer: Self-pay

## 2019-05-27 ENCOUNTER — Ambulatory Visit (INDEPENDENT_AMBULATORY_CARE_PROVIDER_SITE_OTHER): Payer: Medicare Other | Admitting: Infectious Disease

## 2019-05-27 ENCOUNTER — Other Ambulatory Visit: Payer: Self-pay

## 2019-05-27 ENCOUNTER — Ambulatory Visit: Payer: Medicare Other | Admitting: Infectious Diseases

## 2019-05-27 ENCOUNTER — Encounter: Payer: Self-pay | Admitting: Infectious Disease

## 2019-05-27 VITALS — Temp 98.3°F | Wt 197.0 lb

## 2019-05-27 DIAGNOSIS — B20 Human immunodeficiency virus [HIV] disease: Secondary | ICD-10-CM

## 2019-05-27 DIAGNOSIS — M24571 Contracture, right ankle: Secondary | ICD-10-CM | POA: Diagnosis not present

## 2019-05-27 DIAGNOSIS — F4310 Post-traumatic stress disorder, unspecified: Secondary | ICD-10-CM

## 2019-05-27 DIAGNOSIS — A812 Progressive multifocal leukoencephalopathy: Secondary | ICD-10-CM

## 2019-05-27 DIAGNOSIS — F17200 Nicotine dependence, unspecified, uncomplicated: Secondary | ICD-10-CM

## 2019-05-27 DIAGNOSIS — I69851 Hemiplegia and hemiparesis following other cerebrovascular disease affecting right dominant side: Secondary | ICD-10-CM

## 2019-05-27 DIAGNOSIS — F419 Anxiety disorder, unspecified: Secondary | ICD-10-CM

## 2019-05-27 DIAGNOSIS — Z79899 Other long term (current) drug therapy: Secondary | ICD-10-CM

## 2019-05-27 DIAGNOSIS — N926 Irregular menstruation, unspecified: Secondary | ICD-10-CM

## 2019-05-27 DIAGNOSIS — Z113 Encounter for screening for infections with a predominantly sexual mode of transmission: Secondary | ICD-10-CM

## 2019-05-27 DIAGNOSIS — M24574 Contracture, right foot: Secondary | ICD-10-CM

## 2019-05-27 HISTORY — DX: Nicotine dependence, unspecified, uncomplicated: F17.200

## 2019-05-27 MED ORDER — SYMTUZA 800-150-200-10 MG PO TABS: 1.0000 | ORAL_TABLET | Freq: Every day | ORAL | 11 refills | Status: DC

## 2019-05-27 MED ORDER — NICOTINE 7 MG/24HR TD PT24: 7.0000 mg | MEDICATED_PATCH | TRANSDERMAL | 0 refills | Status: DC

## 2019-05-27 MED ORDER — NICOTINE 14 MG/24HR TD PT24: 14.0000 mg | MEDICATED_PATCH | TRANSDERMAL | 0 refills | Status: DC

## 2019-05-27 MED ORDER — TRAZODONE HCL 50 MG PO TABS: 50.0000 mg | ORAL_TABLET | Freq: Every day | ORAL | 11 refills | Status: DC

## 2019-05-27 MED ORDER — DICLOFENAC SODIUM 1 % EX GEL: 4.0000 g | Freq: Three times a day (TID) | CUTANEOUS | 0 refills | Status: DC

## 2019-05-27 MED ORDER — NICOTINE 21 MG/24HR TD PT24: 21.0000 mg | MEDICATED_PATCH | Freq: Every day | TRANSDERMAL | 0 refills | Status: DC
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
## 2019-05-27 NOTE — Addendum Note (Signed)
Addended by: Dolan Amen D on: 05/27/2019 10:35 AM   Modules accepted: Orders

## 2019-05-28 LAB — T-HELPER CELL (CD4) - (RCID CLINIC ONLY)
CD4 % Helper T Cell: 33 % (ref 33–65)
CD4 T Cell Abs: 757 /uL (ref 400–1790)

## 2019-05-30 LAB — CBC WITH DIFFERENTIAL/PLATELET
Absolute Monocytes: 419 cells/uL (ref 200–950)
Basophils Absolute: 43 cells/uL (ref 0–200)
Basophils Relative: 0.6 %
Eosinophils Absolute: 149 cells/uL (ref 15–500)
Eosinophils Relative: 2.1 %
HCT: 44.8 % (ref 35.0–45.0)
Hemoglobin: 15.4 g/dL (ref 11.7–15.5)
Lymphs Abs: 2428 cells/uL (ref 850–3900)
MCH: 29.4 pg (ref 27.0–33.0)
MCHC: 34.4 g/dL (ref 32.0–36.0)
MCV: 85.5 fL (ref 80.0–100.0)
MPV: 11.6 fL (ref 7.5–12.5)
Monocytes Relative: 5.9 %
Neutro Abs: 4061 cells/uL (ref 1500–7800)
Neutrophils Relative %: 57.2 %
Platelets: 232 10*3/uL (ref 140–400)
RBC: 5.24 10*6/uL — ABNORMAL HIGH (ref 3.80–5.10)
RDW: 12.9 % (ref 11.0–15.0)
Total Lymphocyte: 34.2 %
WBC: 7.1 10*3/uL (ref 3.8–10.8)

## 2019-05-30 LAB — COMPLETE METABOLIC PANEL WITH GFR
AG Ratio: 1.4 (calc) (ref 1.0–2.5)
ALT: 17 U/L (ref 6–29)
AST: 16 U/L (ref 10–35)
Albumin: 4.1 g/dL (ref 3.6–5.1)
Alkaline phosphatase (APISO): 69 U/L (ref 31–125)
BUN: 12 mg/dL (ref 7–25)
CO2: 23 mmol/L (ref 20–32)
Calcium: 9.7 mg/dL (ref 8.6–10.2)
Chloride: 108 mmol/L (ref 98–110)
Creat: 0.84 mg/dL (ref 0.50–1.10)
GFR, Est African American: 95 mL/min/{1.73_m2} (ref >=60)
GFR, Est Non African American: 82 mL/min/{1.73_m2} (ref >=60)
Globulin: 2.9 g/dL (calc) (ref 1.9–3.7)
Glucose, Bld: 96 mg/dL (ref 65–99)
Potassium: 3.9 mmol/L (ref 3.5–5.3)
Sodium: 139 mmol/L (ref 135–146)
Total Bilirubin: 0.6 mg/dL (ref 0.2–1.2)
Total Protein: 7 g/dL (ref 6.1–8.1)

## 2019-05-30 LAB — HIV-1 RNA QUANT-NO REFLEX-BLD
HIV 1 RNA Quant: <20 copies/mL
HIV-1 RNA Quant, Log: <1.3 Log copies/mL

## 2019-05-30 LAB — RPR: RPR Ser Ql: NONREACTIVE

## 2019-05-30 LAB — LIPID PANEL
Cholesterol: 199 mg/dL (ref <200)
HDL: 70 mg/dL (ref >=50)
LDL Cholesterol (Calc): 114 mg/dL (calc) — ABNORMAL HIGH
Non-HDL Cholesterol (Calc): 129 mg/dL (calc) (ref <130)
Total CHOL/HDL Ratio: 2.8 (calc) (ref <5.0)
Triglycerides: 63 mg/dL (ref <150)

## 2019-06-24 ENCOUNTER — Ambulatory Visit: Payer: Medicare Other | Admitting: Infectious Diseases

## 2019-06-24 ENCOUNTER — Other Ambulatory Visit: Payer: Self-pay | Admitting: Family Medicine

## 2019-06-24 ENCOUNTER — Ambulatory Visit: Payer: Medicare Other | Admitting: Infectious Disease

## 2019-06-27 ENCOUNTER — Encounter: Payer: Self-pay | Admitting: Infectious Diseases

## 2019-06-27 ENCOUNTER — Other Ambulatory Visit (HOSPITAL_COMMUNITY)
Admission: RE | Admit: 2019-06-27 | Discharge: 2019-06-27 | Disposition: A | Discharge status: Home / Self Care | Payer: Medicare Other | Source: Ambulatory Visit | Attending: Infectious Diseases | Admitting: Infectious Diseases

## 2019-06-27 ENCOUNTER — Ambulatory Visit (INDEPENDENT_AMBULATORY_CARE_PROVIDER_SITE_OTHER): Payer: Medicare Other | Admitting: Infectious Diseases

## 2019-06-27 ENCOUNTER — Other Ambulatory Visit: Payer: Self-pay

## 2019-06-27 VITALS — BP 155/97 | HR 83 | Temp 98.4°F | Wt 197.0 lb

## 2019-06-27 DIAGNOSIS — F17200 Nicotine dependence, unspecified, uncomplicated: Secondary | ICD-10-CM

## 2019-06-27 DIAGNOSIS — L600 Ingrowing nail: Secondary | ICD-10-CM | POA: Diagnosis not present

## 2019-06-27 DIAGNOSIS — Z1151 Encounter for screening for human papillomavirus (HPV): Secondary | ICD-10-CM | POA: Diagnosis not present

## 2019-06-27 DIAGNOSIS — N3941 Urge incontinence: Secondary | ICD-10-CM | POA: Insufficient documentation

## 2019-06-27 DIAGNOSIS — Z124 Encounter for screening for malignant neoplasm of cervix: Secondary | ICD-10-CM | POA: Insufficient documentation

## 2019-06-27 DIAGNOSIS — B20 Human immunodeficiency virus [HIV] disease: Secondary | ICD-10-CM | POA: Diagnosis not present

## 2019-06-27 DIAGNOSIS — N951 Menopausal and female climacteric states: Secondary | ICD-10-CM

## 2019-06-27 NOTE — Assessment & Plan Note (Signed)
Discussed timed toileting to prevent overfilling, reducing caffeine intake. No problems with cystocele on exam.

## 2019-06-27 NOTE — Assessment & Plan Note (Signed)
She had a negative endometrial biopsy in 2016. Symptoms consistent with perimenopausal symptoms. We discussed options today, she may be a good candidate for hormone replacement therapy with daily low dose estrace with Q2-3 month progestin administration but would prefer if she stopped smoking first.  Black cohosh may be helpful for symptoms - will check with pharmacy to see if there is any interaction with Symtuza.

## 2019-06-27 NOTE — Assessment & Plan Note (Signed)
She is currently trying to quit - if she does she may be a candidate for hormone therapy to control menopausal symptoms.

## 2019-06-27 NOTE — Assessment & Plan Note (Signed)
Continue on Symtuza and follow up with Dr. Tommy Medal as scheduled.

## 2019-06-27 NOTE — Assessment & Plan Note (Signed)
Normal pelvic exam. Normal bimanual exam.  Cervical brushing collected for cytology and HPV.  Discussed recommended screening interval for women living with HIV disease meant to be lifelong and at an interval of Q1-3 years pending results. Acceptable to space out Q3y with 3 consecutively normal exams. Further recommendations for Paula Martinez to follow today's results.  Results will be communicated to the patient via telephone call.
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
## 2019-07-02 LAB — CYTOLOGY - PAP
Adequacy: ABSENT
Chlamydia: NEGATIVE
Comment: NEGATIVE
Comment: NEGATIVE
Comment: NORMAL
Diagnosis: NEGATIVE
Diagnosis: REACTIVE
High risk HPV: NEGATIVE
Neisseria Gonorrhea: NEGATIVE

## 2019-07-23 ENCOUNTER — Telehealth: Payer: Self-pay

## 2019-07-23 NOTE — Telephone Encounter (Signed)
Her Pap smear is normal and has no concern for any abnormalities. I recommend she schedule her next screening exam in 3 years.  Please call to let her know. Thank you very much.

## 2019-07-23 NOTE — Telephone Encounter (Signed)
Patient called office today to follow up on PAP results. Would like to know if everything is okay. Will forward message to Np. Prichard

## 2019-07-24 ENCOUNTER — Encounter: Payer: Self-pay | Admitting: Podiatry

## 2019-07-24 ENCOUNTER — Ambulatory Visit (INDEPENDENT_AMBULATORY_CARE_PROVIDER_SITE_OTHER): Payer: Medicare Other | Admitting: Podiatry

## 2019-07-24 ENCOUNTER — Other Ambulatory Visit: Payer: Self-pay

## 2019-07-24 VITALS — Temp 97.2°F

## 2019-07-24 DIAGNOSIS — M79674 Pain in right toe(s): Secondary | ICD-10-CM | POA: Diagnosis not present

## 2019-07-24 DIAGNOSIS — B351 Tinea unguium: Secondary | ICD-10-CM

## 2019-07-24 NOTE — Progress Notes (Signed)
Subjective:   Patient ID: Paula Martinez, female   DOB: 49 y.o.   MRN: [id number]   HPI Patient presents stating the big toenails can be bothersome for her and thick and she does have discoloration she admits that she is still smoking and she is in poor health.  States that she also feels like the tissue is thick around the nails and she is quite emotional today   Review of Systems  All other systems reviewed and are negative.       Objective:  Physical Exam Vitals and nursing note reviewed.  Constitutional:      Appearance: She is well-developed.  Pulmonary:     Effort: Pulmonary effort is normal.  Musculoskeletal:        General: Normal range of motion.  Skin:    General: Skin is warm.  Neurological:     Mental Status: She is alert.     Neurovascular status moderately diminished bilateral with diminished sharp dull vibratory and cap fill time bilateral.  Patient has thickness of the nailbeds that are dystrophic and they are moderately painful when pressed but localized with no erythema edema or drainage noted currently     Assessment:  Damage to the nailbeds but appears to be localized with systemic conditions which make aggressive removal difficult to think about     Plan:  H&P educated her on this and at this time debridement and smoothing done which seemed to help her along with padding and cushioning of the toes.  No active drainage was noted and I do not recommend permanent correction for this patient at this time

## 2019-07-25 NOTE — Progress Notes (Signed)
Patient with normal cytology and negative HPV. She can repeat screening in 3 years. She was notified of results via phone call.

## 2019-07-30 ENCOUNTER — Encounter: Payer: Self-pay | Admitting: Infectious Disease

## 2019-08-03 ENCOUNTER — Ambulatory Visit (HOSPITAL_COMMUNITY): Payer: Medicare Other

## 2019-08-03 ENCOUNTER — Other Ambulatory Visit: Payer: Self-pay

## 2019-08-03 ENCOUNTER — Encounter (HOSPITAL_COMMUNITY): Payer: Self-pay | Admitting: Gynecology

## 2019-08-03 ENCOUNTER — Ambulatory Visit (INDEPENDENT_AMBULATORY_CARE_PROVIDER_SITE_OTHER): Payer: Medicare Other

## 2019-08-03 ENCOUNTER — Ambulatory Visit (HOSPITAL_COMMUNITY)
Admission: EM | Admit: 2019-08-03 | Discharge: 2019-08-03 | Disposition: A | Discharge status: Home / Self Care | Payer: Medicare Other | Attending: Urgent Care | Admitting: Urgent Care

## 2019-08-03 DIAGNOSIS — M79645 Pain in left finger(s): Secondary | ICD-10-CM

## 2019-08-03 DIAGNOSIS — M7989 Other specified soft tissue disorders: Secondary | ICD-10-CM

## 2019-08-03 DIAGNOSIS — R208 Other disturbances of skin sensation: Secondary | ICD-10-CM

## 2019-08-03 MED ORDER — LIDOCAINE-EPINEPHRINE (PF) 2 %-1:200000 IJ SOLN
INTRAMUSCULAR | Status: AC
  Filled 2019-08-03: qty 20

## 2019-08-03 NOTE — Discharge Instructions (Signed)
You may take 500mg -650mg  Tylenol with ibuprofen 400-600mg  every 6 hours for aches, pains, fevers and general inflammation.
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
## 2019-08-03 NOTE — ED Triage Notes (Signed)
Patient c/o splinter in left thumb x over 2 weeks. Pt also c./o swollen at left ring finger for months.

## 2019-09-26 ENCOUNTER — Ambulatory Visit: Payer: Medicare Other | Admitting: Infectious Disease

## 2019-10-18 ENCOUNTER — Other Ambulatory Visit: Payer: Self-pay | Admitting: Family Medicine

## 2019-11-07 ENCOUNTER — Ambulatory Visit: Payer: Medicare Other | Admitting: Podiatry

## 2019-11-13 ENCOUNTER — Other Ambulatory Visit: Payer: Self-pay

## 2019-11-13 ENCOUNTER — Ambulatory Visit (HOSPITAL_COMMUNITY)
Admission: EM | Admit: 2019-11-13 | Discharge: 2019-11-13 | Disposition: A | Discharge status: Home / Self Care | Payer: Medicare Other | Attending: Family Medicine | Admitting: Family Medicine

## 2019-11-13 DIAGNOSIS — H5789 Other specified disorders of eye and adnexa: Secondary | ICD-10-CM | POA: Diagnosis not present

## 2019-11-13 MED ORDER — OLOPATADINE HCL 0.2 % OP SOLN: 1.0000 [drp] | Freq: Once | OPHTHALMIC | 0 refills | Status: AC

## 2019-11-13 MED ORDER — POLYMYXIN B-TRIMETHOPRIM 10000-0.1 UNIT/ML-% OP SOLN: 1.0000 [drp] | OPHTHALMIC | 0 refills | Status: DC

## 2019-11-13 MED ORDER — FLUORESCEIN SODIUM 1 MG OP STRP
ORAL_STRIP | OPHTHALMIC | Status: AC
  Filled 2019-11-13: qty 1

## 2019-11-13 MED ORDER — TETRACAINE HCL 0.5 % OP SOLN
OPHTHALMIC | Status: AC
  Filled 2019-11-13: qty 4

## 2019-11-13 NOTE — ED Triage Notes (Signed)
Pt presents with eye pain and drainage xs 2 weeks. States right eye is worse. States vision is blurring.

## 2019-11-13 NOTE — Discharge Instructions (Addendum)
Treating you with allergy eyedrops and also giving you eyedrops for bacterial infection.  The allergy eyedrops are just 1 time a day.  Make sure you say see eyedrops out so as not to wash the other one out. Polytrim every 4 hours while awake. Recommend keeping eyes clean as possible. Follow up as needed for continued or worsening symptoms
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
## 2019-11-19 ENCOUNTER — Encounter (HOSPITAL_COMMUNITY): Payer: Self-pay

## 2019-11-19 ENCOUNTER — Emergency Department (HOSPITAL_COMMUNITY)
Admission: EM | Admit: 2019-11-19 | Discharge: 2019-11-19 | Disposition: A | Discharge status: Home / Self Care | Payer: Medicare Other | Attending: Emergency Medicine | Admitting: Emergency Medicine

## 2019-11-19 ENCOUNTER — Emergency Department (HOSPITAL_COMMUNITY): Payer: Medicare Other

## 2019-11-19 DIAGNOSIS — R1032 Left lower quadrant pain: Secondary | ICD-10-CM | POA: Insufficient documentation

## 2019-11-19 DIAGNOSIS — J181 Lobar pneumonia, unspecified organism: Secondary | ICD-10-CM | POA: Insufficient documentation

## 2019-11-19 DIAGNOSIS — R112 Nausea with vomiting, unspecified: Secondary | ICD-10-CM | POA: Diagnosis not present

## 2019-11-19 DIAGNOSIS — J189 Pneumonia, unspecified organism: Secondary | ICD-10-CM

## 2019-11-19 DIAGNOSIS — D259 Leiomyoma of uterus, unspecified: Secondary | ICD-10-CM | POA: Diagnosis not present

## 2019-11-19 DIAGNOSIS — R1084 Generalized abdominal pain: Secondary | ICD-10-CM | POA: Insufficient documentation

## 2019-11-19 DIAGNOSIS — Z79899 Other long term (current) drug therapy: Secondary | ICD-10-CM | POA: Insufficient documentation

## 2019-11-19 DIAGNOSIS — Z87891 Personal history of nicotine dependence: Secondary | ICD-10-CM | POA: Insufficient documentation

## 2019-11-19 DIAGNOSIS — R1013 Epigastric pain: Secondary | ICD-10-CM | POA: Diagnosis present

## 2019-11-19 LAB — CBC
HCT: 42.7 % (ref 36.0–46.0)
Hemoglobin: 15.2 g/dL — ABNORMAL HIGH (ref 12.0–15.0)
MCH: 29.3 pg (ref 26.0–34.0)
MCHC: 35.6 g/dL (ref 30.0–36.0)
MCV: 82.3 fL (ref 80.0–100.0)
Platelets: 245 10*3/uL (ref 150–400)
RBC: 5.19 MIL/uL — ABNORMAL HIGH (ref 3.87–5.11)
RDW: 12.5 % (ref 11.5–15.5)
WBC: 11.2 10*3/uL — ABNORMAL HIGH (ref 4.0–10.5)
nRBC: 0 % (ref 0.0–0.2)

## 2019-11-19 LAB — COMPREHENSIVE METABOLIC PANEL
ALT: 17 U/L (ref 0–44)
AST: 17 U/L (ref 15–41)
Albumin: 4 g/dL (ref 3.5–5.0)
Alkaline Phosphatase: 75 U/L (ref 38–126)
Anion gap: 11 (ref 5–15)
BUN: 13 mg/dL (ref 6–20)
CO2: 21 mmol/L — ABNORMAL LOW (ref 22–32)
Calcium: 9.6 mg/dL (ref 8.9–10.3)
Chloride: 109 mmol/L (ref 98–111)
Creatinine, Ser: 0.82 mg/dL (ref 0.44–1.00)
GFR calc Af Amer: >60 mL/min (ref >60)
GFR calc non Af Amer: >60 mL/min (ref >60)
Glucose, Bld: 163 mg/dL — ABNORMAL HIGH (ref 70–99)
Potassium: 3.8 mmol/L (ref 3.5–5.1)
Sodium: 141 mmol/L (ref 135–145)
Total Bilirubin: 0.5 mg/dL (ref 0.3–1.2)
Total Protein: 7.6 g/dL (ref 6.5–8.1)

## 2019-11-19 LAB — LIPASE, BLOOD: Lipase: 29 U/L (ref 11–51)

## 2019-11-19 LAB — URINALYSIS, ROUTINE W REFLEX MICROSCOPIC
Bilirubin Urine: NEGATIVE
Glucose, UA: NEGATIVE mg/dL
Hgb urine dipstick: NEGATIVE
Ketones, ur: NEGATIVE mg/dL
Leukocytes,Ua: NEGATIVE
Nitrite: NEGATIVE
Protein, ur: NEGATIVE mg/dL
Specific Gravity, Urine: <1.005 — ABNORMAL LOW (ref 1.005–1.030)
pH: 7 (ref 5.0–8.0)

## 2019-11-19 LAB — I-STAT BETA HCG BLOOD, ED (MC, WL, AP ONLY): I-stat hCG, quantitative: <5 m[IU]/mL (ref <5)

## 2019-11-19 MED ORDER — DOXYCYCLINE HYCLATE 100 MG PO TABS
100.0000 mg | ORAL_TABLET | Freq: Once | ORAL | Status: AC
  Administered 2019-11-19: 100 mg via ORAL
  Filled 2019-11-19: qty 1

## 2019-11-19 MED ORDER — FAMOTIDINE 20 MG PO TABS: 20.0000 mg | ORAL_TABLET | Freq: Two times a day (BID) | ORAL | 0 refills | Status: DC

## 2019-11-19 MED ORDER — ONDANSETRON HCL 4 MG/2ML IJ SOLN
4.0000 mg | Freq: Once | INTRAMUSCULAR | Status: AC
  Administered 2019-11-19: 4 mg via INTRAVENOUS
  Filled 2019-11-19: qty 2

## 2019-11-19 MED ORDER — DOXYCYCLINE HYCLATE 100 MG PO CAPS: 100.0000 mg | ORAL_CAPSULE | Freq: Two times a day (BID) | ORAL | 0 refills | Status: DC

## 2019-11-19 MED ORDER — FAMOTIDINE IN NACL 20-0.9 MG/50ML-% IV SOLN
20.0000 mg | Freq: Once | INTRAVENOUS | Status: AC
  Administered 2019-11-19: 20 mg via INTRAVENOUS
  Filled 2019-11-19: qty 50

## 2019-11-19 MED ORDER — SODIUM CHLORIDE 0.9 % IV BOLUS
1000.0000 mL | Freq: Once | INTRAVENOUS | Status: AC
  Administered 2019-11-19: 1000 mL via INTRAVENOUS

## 2019-11-19 MED ORDER — IOHEXOL 300 MG/ML  SOLN
100.0000 mL | Freq: Once | INTRAMUSCULAR | Status: AC | PRN
  Administered 2019-11-19: 100 mL via INTRAVENOUS

## 2019-11-19 MED ORDER — ONDANSETRON HCL 4 MG PO TABS: 4.0000 mg | ORAL_TABLET | Freq: Four times a day (QID) | ORAL | 0 refills | Status: DC

## 2019-11-19 NOTE — ED Triage Notes (Signed)
Pt comes via [facility] for upper abd pain, reports that she ate some soup today that smelled bad and since has been having abd pain and vomiting.

## 2019-11-19 NOTE — ED Notes (Signed)
Pt name called for vitals, no response 

## 2019-11-19 NOTE — ED Notes (Signed)
Patient verbalizes understanding of discharge instructions. Opportunity for questioning and answers were provided. Armband removed by staff, pt discharged from ED.  
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
## 2019-11-27 ENCOUNTER — Other Ambulatory Visit: Payer: Self-pay

## 2019-11-27 ENCOUNTER — Ambulatory Visit (INDEPENDENT_AMBULATORY_CARE_PROVIDER_SITE_OTHER): Payer: Medicare Other | Admitting: Infectious Disease

## 2019-11-27 ENCOUNTER — Encounter: Payer: Self-pay | Admitting: Infectious Disease

## 2019-11-27 VITALS — BP 121/76 | HR 61 | Resp 16 | Ht 67.0 in | Wt 188.0 lb

## 2019-11-27 DIAGNOSIS — M24571 Contracture, right ankle: Secondary | ICD-10-CM | POA: Diagnosis not present

## 2019-11-27 DIAGNOSIS — M2041 Other hammer toe(s) (acquired), right foot: Secondary | ICD-10-CM | POA: Diagnosis not present

## 2019-11-27 DIAGNOSIS — B20 Human immunodeficiency virus [HIV] disease: Secondary | ICD-10-CM | POA: Diagnosis not present

## 2019-11-27 DIAGNOSIS — Z23 Encounter for immunization: Secondary | ICD-10-CM | POA: Diagnosis not present

## 2019-11-27 DIAGNOSIS — M2042 Other hammer toe(s) (acquired), left foot: Secondary | ICD-10-CM

## 2019-11-27 DIAGNOSIS — M79674 Pain in right toe(s): Secondary | ICD-10-CM

## 2019-11-27 DIAGNOSIS — A812 Progressive multifocal leukoencephalopathy: Secondary | ICD-10-CM | POA: Diagnosis not present

## 2019-11-27 DIAGNOSIS — M24574 Contracture, right foot: Secondary | ICD-10-CM
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
## 2019-11-28 LAB — T-HELPER CELL (CD4) - (RCID CLINIC ONLY)
CD4 % Helper T Cell: 32 % — ABNORMAL LOW (ref 33–65)
CD4 T Cell Abs: 866 /uL (ref 400–1790)

## 2019-11-30 LAB — CBC WITH DIFFERENTIAL/PLATELET
Absolute Monocytes: 365 cells/uL (ref 200–950)
Basophils Absolute: 29 cells/uL (ref 0–200)
Basophils Relative: 0.5 %
Eosinophils Absolute: 177 cells/uL (ref 15–500)
Eosinophils Relative: 3.1 %
HCT: 41.4 % (ref 35.0–45.0)
Hemoglobin: 14.2 g/dL (ref 11.7–15.5)
Lymphs Abs: 2947 cells/uL (ref 850–3900)
MCH: 29.4 pg (ref 27.0–33.0)
MCHC: 34.3 g/dL (ref 32.0–36.0)
MCV: 85.7 fL (ref 80.0–100.0)
MPV: 12.5 fL (ref 7.5–12.5)
Monocytes Relative: 6.4 %
Neutro Abs: 2183 cells/uL (ref 1500–7800)
Neutrophils Relative %: 38.3 %
Platelets: 203 10*3/uL (ref 140–400)
RBC: 4.83 10*6/uL (ref 3.80–5.10)
RDW: 12.6 % (ref 11.0–15.0)
Total Lymphocyte: 51.7 %
WBC: 5.7 10*3/uL (ref 3.8–10.8)

## 2019-11-30 LAB — SARS-COV-2 ANTIBODY(IGG)SPIKE,SEMI-QUANTITATIVE: SARS COV1 AB(IGG)SPIKE,SEMI QN: >20 index — ABNORMAL HIGH (ref <1.00)

## 2019-11-30 LAB — COMPLETE METABOLIC PANEL WITH GFR
AG Ratio: 1.5 (calc) (ref 1.0–2.5)
ALT: 19 U/L (ref 6–29)
AST: 16 U/L (ref 10–35)
Albumin: 4.2 g/dL (ref 3.6–5.1)
Alkaline phosphatase (APISO): 78 U/L (ref 31–125)
BUN: 11 mg/dL (ref 7–25)
CO2: 23 mmol/L (ref 20–32)
Calcium: 9.5 mg/dL (ref 8.6–10.2)
Chloride: 109 mmol/L (ref 98–110)
Creat: 0.69 mg/dL (ref 0.50–1.10)
GFR, Est African American: 118 mL/min/{1.73_m2} (ref >=60)
GFR, Est Non African American: 102 mL/min/{1.73_m2} (ref >=60)
Globulin: 2.8 g/dL (calc) (ref 1.9–3.7)
Glucose, Bld: 86 mg/dL (ref 65–99)
Potassium: 3.8 mmol/L (ref 3.5–5.3)
Sodium: 140 mmol/L (ref 135–146)
Total Bilirubin: 0.4 mg/dL (ref 0.2–1.2)
Total Protein: 7 g/dL (ref 6.1–8.1)

## 2019-11-30 LAB — RPR: RPR Ser Ql: NONREACTIVE

## 2019-11-30 LAB — HIV-1 RNA QUANT-NO REFLEX-BLD
HIV 1 RNA Quant: <20 Copies/mL
HIV-1 RNA Quant, Log: <1.3 Log cps/mL

## 2019-12-03 ENCOUNTER — Telehealth: Payer: Self-pay | Admitting: *Deleted

## 2019-12-03 NOTE — Telephone Encounter (Signed)
Faxed last office note, [facility] to [facility] at patient's request (received signed release of information). Landis Gandy, RN

## 2019-12-10 ENCOUNTER — Telehealth: Payer: Self-pay

## 2019-12-10 NOTE — Telephone Encounter (Signed)
I'm out of the office for next 3 weeks

## 2019-12-10 NOTE — Telephone Encounter (Signed)
Patient called office today stating she would like for MD to fill forms out for a new wheel chair, SCAT, and for care attendant. Informed patient that MD is not in office and should have PCP fill on her behalf. Patient does not have a PCP and would like for MD to fill out.  States she will stop by office today to have someone review forms with her and help her find best resources.  Paula Martinez

## 2019-12-13 NOTE — Telephone Encounter (Signed)
Patient made aware that MD is not in the office until end of October. Received forms via fax, will place in MD box. Copy also available in triage.

## 2019-12-19 ENCOUNTER — Ambulatory Visit: Payer: Medicare Other | Admitting: Podiatry

## 2019-12-26 ENCOUNTER — Encounter: Payer: Self-pay | Admitting: Sports Medicine

## 2019-12-26 ENCOUNTER — Ambulatory Visit (INDEPENDENT_AMBULATORY_CARE_PROVIDER_SITE_OTHER): Payer: Medicare Other | Admitting: Sports Medicine

## 2019-12-26 ENCOUNTER — Other Ambulatory Visit: Payer: Self-pay

## 2019-12-26 ENCOUNTER — Ambulatory Visit (INDEPENDENT_AMBULATORY_CARE_PROVIDER_SITE_OTHER): Payer: Medicare Other

## 2019-12-26 DIAGNOSIS — B359 Dermatophytosis, unspecified: Secondary | ICD-10-CM | POA: Diagnosis not present

## 2019-12-26 DIAGNOSIS — L84 Corns and callosities: Secondary | ICD-10-CM

## 2019-12-26 DIAGNOSIS — M795 Residual foreign body in soft tissue: Secondary | ICD-10-CM

## 2019-12-26 DIAGNOSIS — B351 Tinea unguium: Secondary | ICD-10-CM

## 2019-12-26 DIAGNOSIS — M79671 Pain in right foot: Secondary | ICD-10-CM

## 2019-12-26 MED ORDER — CLOTRIMAZOLE-BETAMETHASONE 1-0.05 % EX CREA: 1.0000 "application " | TOPICAL_CREAM | Freq: Two times a day (BID) | CUTANEOUS | 0 refills | Status: DC

## 2019-12-26 MED ORDER — CLOTRIMAZOLE 1 % EX SOLN: 1.0000 "application " | Freq: Two times a day (BID) | CUTANEOUS | 5 refills | Status: DC

## 2019-12-26 MED ORDER — TERBINAFINE HCL 250 MG PO TABS: 250.0000 mg | ORAL_TABLET | Freq: Every day | ORAL | 0 refills | Status: DC
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
## 2019-12-26 NOTE — Progress Notes (Signed)
DG  °

## 2019-12-31 NOTE — Telephone Encounter (Signed)
Patient calling to inquire about status on forms.  In previous note patient was informed MD would not return to the office until the end of the month. This information was reiterated to patient once more. Patient verbalized understanding.  Paula Martinez

## 2020-01-09 NOTE — Telephone Encounter (Signed)
Patient called to follow up on forms for multiple services. States some services have been on hold due to forms. Advised patient to have case manager reach out to office regarding this. Will call office if she has any questions. Patient understands MD is not in office. Provider will not be in clinic until next week. [place]

## 2020-01-09 NOTE — Telephone Encounter (Signed)
Received call from Mickey Farber, residential refuse supervisor with the city of [place]. States patient is applying for service to have trash picked up at home and not at the curb due to wheelchair dependency.  Forms must be completed and signed in order for service to begin.  P: [phone] F: [phone] Understands MD is not in office. Will keep an eye our for forms this upcoming week. Can call questions directly to [place].  [place]

## 2020-01-09 NOTE — Telephone Encounter (Signed)
Patient called to follow up about paperwork requiring provider signature. RN left vm requesting call back. Provider will be back in office next week; will follow up then.   Takao Lizer Lorita Officer, RN

## 2020-01-10 NOTE — Telephone Encounter (Signed)
Patient made aware that forms are completed and she needs to fill out her portion of the forms. Patient states she has already completed that portion and has sent it, she only needed MD signature. All forms faxed and completed forms placed in triage and copy up front for scanning.  Paula Martinez

## 2020-01-10 NOTE — Telephone Encounter (Signed)
Forms given to Dr. Gale Journey who has agreed to complete them. Please return forms to triage once completed. Thanks

## 2020-01-13 ENCOUNTER — Telehealth: Payer: Self-pay

## 2020-01-13 NOTE — Telephone Encounter (Signed)
Patient called to follow up on paperwork. Reports that [employer] never received paperwork from our office about new wheelchair. States that she dropped off paperwork for the [facility] transport, home health and wheelchair upgrade paperwork. [place] and home health paperwork was faxed on Friday 01/10/20 however she states that the additional paperwork for [employer] was never received. She provided phone number for their office. RN called and spoke with a representative who stated that they need an office note explaining the need for the wheelchair and a demographic sheet. Most recent note does not go into great detail about wheelchair need aside from paraplegia. Called patient to state that RN will fax the paperwork, however she isn't sure if that will be sufficient for what they need. Patient became increasingly upset stating that her previous ID doctor had no problems getting this done and she wasn't sure why we wouldn't complete. RN reiterated that she would fax the office note, but that she can't promise that it'll be enough. Asked if she had seen a PCP recently as they would be better equipped to document this; patient again stated that she doesn't understand why we can't help. Stated that part of the reason she's in the wheelchair is infectious. RN said that the paperwork would be faxed and if we hear from the company that we would follow up, patient hung up on RN before could confirm with her.   [employer] Information [phone] (phone) [phone] (fax)  Carlean Purl, RN

## 2020-01-15 NOTE — Telephone Encounter (Signed)
Paula Martinez thanks so much for doing all of this!

## 2020-01-23 NOTE — Telephone Encounter (Signed)
Received wheelchair evaluation form. Form also requesting office visit note with manual wheelchair evaluation. Will send last office visit note.  Copy of this form will be attached to all other forms/documents recently sent for services.  Original form will be place in MD box in triage.  Eugenia Mcalpine

## 2020-01-27 NOTE — Telephone Encounter (Addendum)
Wheelchair Evaluation Form faxed to [employer] at [phone]  Confirmation received. Patient made aware that documents have been faxed. Copy of updated form placed in triage with previous documents.  Paula Martinez

## 2020-01-27 NOTE — Telephone Encounter (Signed)
I signed form this am

## 2020-01-30 ENCOUNTER — Other Ambulatory Visit: Payer: Self-pay

## 2020-01-30 ENCOUNTER — Encounter: Payer: Self-pay | Admitting: Sports Medicine

## 2020-01-30 ENCOUNTER — Ambulatory Visit (INDEPENDENT_AMBULATORY_CARE_PROVIDER_SITE_OTHER): Payer: Medicare Other | Admitting: Sports Medicine

## 2020-01-30 DIAGNOSIS — L84 Corns and callosities: Secondary | ICD-10-CM | POA: Diagnosis not present

## 2020-01-30 DIAGNOSIS — M79671 Pain in right foot: Secondary | ICD-10-CM

## 2020-01-30 DIAGNOSIS — B351 Tinea unguium: Secondary | ICD-10-CM | POA: Diagnosis not present

## 2020-01-30 DIAGNOSIS — B359 Dermatophytosis, unspecified: Secondary | ICD-10-CM

## 2020-01-30 MED ORDER — GORDONS UREA 40 % EX OINT: TOPICAL_OINTMENT | CUTANEOUS | 0 refills | Status: DC | PRN
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
## 2020-02-10 ENCOUNTER — Other Ambulatory Visit: Payer: Self-pay | Admitting: Family Medicine

## 2020-02-10 DIAGNOSIS — Z1231 Encounter for screening mammogram for malignant neoplasm of breast: Secondary | ICD-10-CM

## 2020-02-13 ENCOUNTER — Other Ambulatory Visit: Payer: Self-pay | Admitting: Sports Medicine

## 2020-02-13 MED ORDER — UREA 10 % EX CREA: TOPICAL_CREAM | CUTANEOUS | 0 refills | Status: DC | PRN

## 2020-02-13 NOTE — Progress Notes (Signed)
Changed urea ointment to cream due to pharmacy request. -Dr. Cannon Kettle

## 2020-02-20 ENCOUNTER — Other Ambulatory Visit: Payer: Self-pay | Admitting: Sports Medicine

## 2020-02-20 MED ORDER — AMMONIUM LACTATE 12 % EX CREA: TOPICAL_CREAM | CUTANEOUS | 0 refills | Status: DC | PRN

## 2020-02-20 NOTE — Progress Notes (Signed)
Changed Carmol 10 to Amlactin cream

## 2020-03-16 ENCOUNTER — Encounter: Payer: Medicare Other | Admitting: Obstetrics & Gynecology

## 2020-03-20 ENCOUNTER — Other Ambulatory Visit: Payer: Self-pay | Admitting: Family Medicine

## 2020-03-23 ENCOUNTER — Other Ambulatory Visit: Payer: Self-pay

## 2020-03-23 ENCOUNTER — Ambulatory Visit
Admission: RE | Admit: 2020-03-23 | Discharge: 2020-03-23 | Disposition: A | Discharge status: Home / Self Care | Payer: Medicare Other | Source: Ambulatory Visit | Attending: Family Medicine | Admitting: Family Medicine

## 2020-03-23 DIAGNOSIS — Z1231 Encounter for screening mammogram for malignant neoplasm of breast: Secondary | ICD-10-CM

## 2020-03-25 ENCOUNTER — Other Ambulatory Visit: Payer: Self-pay | Admitting: Family Medicine

## 2020-03-25 DIAGNOSIS — R928 Other abnormal and inconclusive findings on diagnostic imaging of breast: Secondary | ICD-10-CM

## 2020-03-26 ENCOUNTER — Ambulatory Visit: Payer: Medicare Other | Admitting: Physical Therapy

## 2020-04-02 ENCOUNTER — Ambulatory Visit: Payer: Medicare Other | Admitting: Sports Medicine

## 2020-04-07 ENCOUNTER — Ambulatory Visit: Payer: Medicare Other

## 2020-04-07 ENCOUNTER — Other Ambulatory Visit: Payer: Self-pay | Admitting: Family Medicine

## 2020-04-07 ENCOUNTER — Ambulatory Visit
Admission: RE | Admit: 2020-04-07 | Discharge: 2020-04-07 | Disposition: A | Discharge status: Home / Self Care | Payer: Medicare Other | Source: Ambulatory Visit | Attending: Family Medicine | Admitting: Family Medicine

## 2020-04-07 ENCOUNTER — Other Ambulatory Visit: Payer: Self-pay

## 2020-04-07 DIAGNOSIS — R928 Other abnormal and inconclusive findings on diagnostic imaging of breast: Secondary | ICD-10-CM

## 2020-04-17 ENCOUNTER — Other Ambulatory Visit: Payer: Self-pay

## 2020-04-17 ENCOUNTER — Ambulatory Visit
Admission: RE | Admit: 2020-04-17 | Discharge: 2020-04-17 | Disposition: A | Discharge status: Home / Self Care | Payer: Medicare Other | Source: Ambulatory Visit | Attending: Family Medicine | Admitting: Family Medicine

## 2020-04-17 DIAGNOSIS — R928 Other abnormal and inconclusive findings on diagnostic imaging of breast: Secondary | ICD-10-CM

## 2020-05-07 ENCOUNTER — Encounter: Payer: Medicare Other | Admitting: Family Medicine

## 2020-06-15 ENCOUNTER — Other Ambulatory Visit: Payer: Self-pay | Admitting: Infectious Disease

## 2020-06-15 DIAGNOSIS — N926 Irregular menstruation, unspecified: Secondary | ICD-10-CM

## 2020-06-15 DIAGNOSIS — Z79899 Other long term (current) drug therapy: Secondary | ICD-10-CM

## 2020-06-15 DIAGNOSIS — I69851 Hemiplegia and hemiparesis following other cerebrovascular disease affecting right dominant side: Secondary | ICD-10-CM

## 2020-06-15 DIAGNOSIS — Z113 Encounter for screening for infections with a predominantly sexual mode of transmission: Secondary | ICD-10-CM

## 2020-06-15 DIAGNOSIS — F419 Anxiety disorder, unspecified: Secondary | ICD-10-CM

## 2020-06-15 DIAGNOSIS — F4310 Post-traumatic stress disorder, unspecified: Secondary | ICD-10-CM

## 2020-06-15 DIAGNOSIS — B20 Human immunodeficiency virus [HIV] disease: Secondary | ICD-10-CM

## 2020-06-15 DIAGNOSIS — A812 Progressive multifocal leukoencephalopathy: Secondary | ICD-10-CM

## 2020-06-17 ENCOUNTER — Other Ambulatory Visit: Payer: Self-pay

## 2020-06-17 ENCOUNTER — Ambulatory Visit (INDEPENDENT_AMBULATORY_CARE_PROVIDER_SITE_OTHER): Payer: Medicare Other | Admitting: Infectious Disease

## 2020-06-17 VITALS — BP 123/77 | HR 87 | Temp 98.5°F | Wt 196.0 lb

## 2020-06-17 DIAGNOSIS — M24571 Contracture, right ankle: Secondary | ICD-10-CM | POA: Diagnosis not present

## 2020-06-17 DIAGNOSIS — N926 Irregular menstruation, unspecified: Secondary | ICD-10-CM

## 2020-06-17 DIAGNOSIS — F4310 Post-traumatic stress disorder, unspecified: Secondary | ICD-10-CM

## 2020-06-17 DIAGNOSIS — B20 Human immunodeficiency virus [HIV] disease: Secondary | ICD-10-CM

## 2020-06-17 DIAGNOSIS — F419 Anxiety disorder, unspecified: Secondary | ICD-10-CM

## 2020-06-17 DIAGNOSIS — I69851 Hemiplegia and hemiparesis following other cerebrovascular disease affecting right dominant side: Secondary | ICD-10-CM

## 2020-06-17 DIAGNOSIS — Z113 Encounter for screening for infections with a predominantly sexual mode of transmission: Secondary | ICD-10-CM

## 2020-06-17 DIAGNOSIS — M24574 Contracture, right foot: Secondary | ICD-10-CM

## 2020-06-17 DIAGNOSIS — A812 Progressive multifocal leukoencephalopathy: Secondary | ICD-10-CM | POA: Diagnosis not present

## 2020-06-17 DIAGNOSIS — Z79899 Other long term (current) drug therapy: Secondary | ICD-10-CM

## 2020-06-17 MED ORDER — SYMTUZA 800-150-200-10 MG PO TABS: 1.0000 | ORAL_TABLET | Freq: Every day | ORAL | 11 refills | Status: DC
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
## 2020-06-18 ENCOUNTER — Ambulatory Visit: Payer: Medicare Other | Admitting: Sports Medicine

## 2020-06-18 LAB — T-HELPER CELL (CD4) - (RCID CLINIC ONLY)
CD4 % Helper T Cell: 31 % — ABNORMAL LOW (ref 33–65)
CD4 T Cell Abs: 926 /uL (ref 400–1790)

## 2020-06-20 LAB — COMPLETE METABOLIC PANEL WITH GFR
AG Ratio: 1.4 (calc) (ref 1.0–2.5)
ALT: 14 U/L (ref 6–29)
AST: 11 U/L (ref 10–35)
Albumin: 3.9 g/dL (ref 3.6–5.1)
Alkaline phosphatase (APISO): 73 U/L (ref 31–125)
BUN: 9 mg/dL (ref 7–25)
CO2: 26 mmol/L (ref 20–32)
Calcium: 9.5 mg/dL (ref 8.6–10.2)
Chloride: 110 mmol/L (ref 98–110)
Creat: 0.73 mg/dL (ref 0.50–1.10)
GFR, Est African American: 112 mL/min/{1.73_m2} (ref >=60)
GFR, Est Non African American: 97 mL/min/{1.73_m2} (ref >=60)
Globulin: 2.7 g/dL (calc) (ref 1.9–3.7)
Glucose, Bld: 124 mg/dL — ABNORMAL HIGH (ref 65–99)
Potassium: 3.9 mmol/L (ref 3.5–5.3)
Sodium: 142 mmol/L (ref 135–146)
Total Bilirubin: 0.4 mg/dL (ref 0.2–1.2)
Total Protein: 6.6 g/dL (ref 6.1–8.1)

## 2020-06-20 LAB — CBC WITH DIFFERENTIAL/PLATELET
Absolute Monocytes: 456 cells/uL (ref 200–950)
Basophils Absolute: 40 cells/uL (ref 0–200)
Basophils Relative: 0.6 %
Eosinophils Absolute: 141 cells/uL (ref 15–500)
Eosinophils Relative: 2.1 %
HCT: 42.1 % (ref 35.0–45.0)
Hemoglobin: 14.5 g/dL (ref 11.7–15.5)
Lymphs Abs: 3015 cells/uL (ref 850–3900)
MCH: 30 pg (ref 27.0–33.0)
MCHC: 34.4 g/dL (ref 32.0–36.0)
MCV: 87 fL (ref 80.0–100.0)
MPV: 11.7 fL (ref 7.5–12.5)
Monocytes Relative: 6.8 %
Neutro Abs: 3049 cells/uL (ref 1500–7800)
Neutrophils Relative %: 45.5 %
Platelets: 238 10*3/uL (ref 140–400)
RBC: 4.84 10*6/uL (ref 3.80–5.10)
RDW: 12.7 % (ref 11.0–15.0)
Total Lymphocyte: 45 %
WBC: 6.7 10*3/uL (ref 3.8–10.8)

## 2020-06-20 LAB — RPR: RPR Ser Ql: NONREACTIVE

## 2020-06-20 LAB — HIV-1 RNA QUANT-NO REFLEX-BLD
HIV 1 RNA Quant: NOT DETECTED Copies/mL
HIV-1 RNA Quant, Log: NOT DETECTED Log cps/mL

## 2020-06-29 ENCOUNTER — Encounter: Payer: Medicare Other | Admitting: Student

## 2020-07-08 ENCOUNTER — Other Ambulatory Visit: Payer: Self-pay

## 2020-07-08 ENCOUNTER — Ambulatory Visit: Payer: Medicare Other | Attending: Family Medicine

## 2020-07-08 DIAGNOSIS — R2689 Other abnormalities of gait and mobility: Secondary | ICD-10-CM | POA: Diagnosis present

## 2020-07-08 DIAGNOSIS — M6281 Muscle weakness (generalized): Secondary | ICD-10-CM | POA: Diagnosis present
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
## 2020-07-10 ENCOUNTER — Ambulatory Visit: Payer: Medicare Other

## 2020-07-10 ENCOUNTER — Ambulatory Visit (INDEPENDENT_AMBULATORY_CARE_PROVIDER_SITE_OTHER): Payer: Medicare Other | Admitting: Internal Medicine

## 2020-07-10 ENCOUNTER — Other Ambulatory Visit: Payer: Self-pay

## 2020-07-10 DIAGNOSIS — Z23 Encounter for immunization: Secondary | ICD-10-CM

## 2020-07-10 NOTE — Progress Notes (Signed)
   Covid-19 Vaccination Clinic  Name:  Paula Martinez    MRN: [id number] DOB: 1970/07/04  07/10/2020  Paula Martinez was observed post Covid-19 immunization for 15 minutes without incident. She was provided with Vaccine Information Sheet and instruction to access the V-Safe system.   Paula Martinez was instructed to call 911 with any severe reactions post vaccine: Marland Kitchen Difficulty breathing  . Swelling of face and throat  . A fast heartbeat  . A bad rash all over body  . Dizziness and weakness    [place]

## 2020-07-13 ENCOUNTER — Telehealth: Payer: Self-pay

## 2020-07-13 ENCOUNTER — Ambulatory Visit: Payer: Medicare Other

## 2020-07-13 NOTE — Telephone Encounter (Signed)
Dr. Tommy Medal, Pamula Santo was evaluated by PT on 07/08/20 for w/c eval.  The patient would benefit from full PT evaluation for hemiparesis and abnormality of gait.  I also think an OT evaluation would be helpful to work on improving RUE strength/flexibility and ADLs. She was open to both. If you agree, please place an order in [facility]que in [facility] or fax the order to [phone]. Thank you, Cherly Anderson, PT, DPT, [facility] [address] [place], [place]  [address] Phone:  [phone] Fax:  [phone]

## 2020-07-14 ENCOUNTER — Other Ambulatory Visit: Payer: Self-pay | Admitting: Infectious Disease

## 2020-07-14 DIAGNOSIS — A812 Progressive multifocal leukoencephalopathy: Secondary | ICD-10-CM

## 2020-07-14 DIAGNOSIS — I69851 Hemiplegia and hemiparesis following other cerebrovascular disease affecting right dominant side: Secondary | ICD-10-CM

## 2020-07-14 NOTE — Telephone Encounter (Signed)
Thank you! I see both referrals there.

## 2020-07-14 NOTE — Telephone Encounter (Signed)
Thank you. Patient made aware of referrals.

## 2020-07-20 ENCOUNTER — Encounter: Payer: Self-pay | Admitting: Obstetrics and Gynecology

## 2020-07-20 ENCOUNTER — Other Ambulatory Visit: Payer: Self-pay

## 2020-07-20 ENCOUNTER — Ambulatory Visit (INDEPENDENT_AMBULATORY_CARE_PROVIDER_SITE_OTHER): Payer: Medicare Other | Admitting: Obstetrics and Gynecology

## 2020-07-20 VITALS — BP 120/96 | HR 65 | Ht 67.0 in | Wt 194.6 lb

## 2020-07-20 DIAGNOSIS — N951 Menopausal and female climacteric states: Secondary | ICD-10-CM | POA: Diagnosis not present

## 2020-07-20 DIAGNOSIS — N926 Irregular menstruation, unspecified: Secondary | ICD-10-CM

## 2020-07-20 DIAGNOSIS — D259 Leiomyoma of uterus, unspecified: Secondary | ICD-10-CM

## 2020-07-20 NOTE — Progress Notes (Signed)
  CC: perimenopausal symptoms, fibroids Subjective:    Patient ID: Seng Elvis Coil, female    DOB: 1971/01/31, 50 y.o.   MRN: [id number]  HPI 50 yo G3P2, SVD x 2.  SAB x 1.  Irregular bleeding noted x 1.5 years.  Hot flashes and night sweats started a year ago.  Have gotten worse over the last 9 months.  Pt notes decreased libido for 8-9 months.  Menses are becoming more irregular, intervals can be as long as 5-6 months.  Menses when present last 2-3 days.  Pt asked if she felt this had anything to do with her previous HIV diagnosis and she said no.    Review of Systems  Constitutional: Negative.   HENT: Negative.   Respiratory: Negative.   Cardiovascular: Negative.   Gastrointestinal: Negative.   Endocrine:       Hot flashes and night sweats  Genitourinary: Positive for menstrual problem.       Objective:   Physical Exam Vitals:   07/20/20 1528  BP: (!) 120/96  Pulse: 65         Assessment & Plan:   1. Perimenopause From menstrual pattern and other symptoms , the patient is highly  suspicious for perimenopause, will check hormone panel for further eval.  2. Menopausal vasomotor syndrome  - FSH - LH - Estradiol - TSH - T4  3. Uterine leiomyoma, unspecified location Pelvic u/s ordered, bimanual exam next visit, (pt had to leave early today) - US PELVIC COMPLETE WITH TRANSVAGINAL; Future  4. Irregular menses If u/s is suspicious, pt may need endometrial biopsy in the future - US PELVIC COMPLETE WITH TRANSVAGINAL; Future - B-HCG Quant  F/u in 3 weeks  Griffin Basil, MD Faculty Attending, Center for [facility]

## 2020-07-20 NOTE — Patient Instructions (Signed)
Uterine Fibroids  Uterine fibroids, also called leiomyomas, are noncancerous (benign) tumors that can grow in the uterus. They can cause heavy menstrual bleeding and pain. Fibroids may also grow in the fallopian tubes, cervix, or tissues (ligaments) near the uterus. You may have one or many fibroids. Fibroids vary in size, weight, and where they grow in the uterus. Some can become quite large. Most fibroids do not require medical treatment. What are the causes? The cause of this condition is not known. What increases the risk? You are more likely to develop this condition if you:  Are in your 30s or 40s and have not gone through menopause.  Have a family history of this condition.  Are of African American descent.  Started your menstrual period at age 50 or younger.  Have never given birth.  Are overweight or obese. What are the signs or symptoms? Many women do not have any symptoms. Symptoms of this condition may include:  Heavy menstrual bleeding.  Bleeding between menstrual periods.  Pain and pressure in the pelvic area, between your hip bones.  Pain during sex.  Bladder problems, such as needing to urinate right away or more often than usual.  Inability to have children (infertility).  Failure to carry pregnancy to term (miscarriage). How is this diagnosed? This condition may be diagnosed based on:  Your symptoms and medical history.  A physical exam.  A pelvic exam that includes feeling for any tumors.  Imaging tests, such as ultrasound or MRI. How is this treated? Treatment for this condition may include follow-up visits with your health care provider to monitor your fibroids for any changes. Other treatment may include:  Medicines, such as: ? Medicines to relieve pain, including aspirin and NSAIDs, such as ibuprofen or naproxen. ? Hormone therapy. Treatment may be given as a pill or an injection, or it may be inserted into the uterus using an intrauterine  device (IUD).  Surgery that would do one of the following: ? Remove the fibroids (myomectomy). This may be recommended if fibroids affect your fertility and you want to become pregnant. ? Remove the uterus (hysterectomy). ? Block the blood supply to the fibroids (uterine artery embolization). This can cause them to shrink and die. Follow these instructions at home: Medicines  Take over-the-counter and prescription medicines only as told by your health care provider.  Ask your health care provider if you should take iron pills or eat more iron-rich foods, such as dark green, leafy vegetables. Heavy menstrual bleeding can cause low iron levels. Managing pain If directed, apply heat to your back or abdomen to reduce pain. Use the heat source that your health care provider recommends, such as a moist heat pack or a heating pad. To apply heat:  Place a towel between your skin and the heat source.  Leave the heat on for 20-30 minutes.  Remove the heat if your skin turns bright red. This is especially important if you are unable to feel pain, heat, or cold. You may have a greater risk of getting burned.   General instructions  Pay close attention to your menstrual cycle. Tell your health care provider about any changes, such as: ? Heavier bleeding that requires you to change your pads or tampons more than usual. ? A change in the number of days that your menstrual period lasts. ? A change in symptoms that come with your menstrual period, such as back pain or cramps in your abdomen.  Keep all follow-up visits. This is  important, especially if your fibroids need to be monitored for any changes. Contact a health care provider if you:  Have pelvic pain, back pain, or cramps in your abdomen that do not get better with medicine or heat.  Develop new bleeding between menstrual periods.  Have increased bleeding during or between menstrual periods.  Feel more tired or weak than usual.  Feel  light-headed. Get help right away if you:  Faint.  Have pelvic pain that suddenly gets worse.  Have severe vaginal bleeding that soaks a tampon or pad in 30 minutes or less. Summary  Uterine fibroids are noncancerous (benign) tumors that can develop in the uterus.  The exact cause of this condition is not known.  Most fibroids do not require medical treatment unless they affect your ability to have children (fertility).  Contact a health care provider if you have pelvic pain, back pain, or cramps in your abdomen that do not get better with medicines.  Get help right away if you faint, have pelvic pain that suddenly gets worse, or have severe vaginal bleeding. This information is not intended to replace advice given to you by your health care provider. Make sure you discuss any questions you have with your health care provider. Document Revised: 10/01/2019 Document Reviewed: 10/01/2019 Elsevier Patient Education  [place]. https://www.womenshealth.gov/menopause/menopause-basics"> https://www.clinicalkey.com">  Menopause Menopause is the normal time of a woman's life when menstrual periods stop completely. It marks the natural end to a woman's ability to become pregnant. It can be defined as the absence of a menstrual period for 12 months without another medical cause. The transition to menopause (perimenopause) most often happens between the ages of 25 and 6, and can last for many years. During perimenopause, hormone levels change in your body, which can cause symptoms and affect your health. Menopause may increase your risk for:  Weakened bones (osteoporosis), which causes fractures.  Depression.  Hardening and narrowing of the arteries (atherosclerosis), which can cause heart attacks and strokes. What are the causes? This condition is usually caused by a natural change in hormone levels that happens as you get older. The condition may also be caused by changes that are not  natural, including:  Surgery to remove both ovaries (surgical menopause).  Side effects from some medicines, such as chemotherapy used to treat cancer (chemical menopause). What increases the risk? This condition is more likely to start at an earlier age if you have certain medical conditions or have undergone treatments, including:  A tumor of the pituitary gland in the brain.  A disease that affects the ovaries and hormones.  Certain cancer treatments, such as chemotherapy or hormone therapy, or radiation therapy on the pelvis.  Heavy smoking and excessive alcohol use.  Family history of early menopause. This condition is also more likely to develop earlier in women who are very thin. What are the signs or symptoms? Symptoms of this condition include:  Hot flashes.  Irregular menstrual periods.  Night sweats.  Changes in feelings about sex. This could be a decrease in sex drive or an increased discomfort around your sexuality.  Vaginal dryness and thinning of the vaginal walls. This may cause painful sex.  Dryness of the skin and development of wrinkles.  Headaches.  Problems sleeping (insomnia).  Mood swings or irritability.  Memory problems.  Weight gain.  Hair growth on the face and chest.  Bladder infections or problems with urinating. How is this diagnosed? This condition is diagnosed based on your medical history,  a physical exam, your age, your menstrual history, and your symptoms. Hormone tests may also be done. How is this treated? In some cases, no treatment is needed. You and your health care provider should make a decision together about whether treatment is necessary. Treatment will be based on your individual condition and preferences. Treatment for this condition focuses on managing symptoms. Treatment may include:  Menopausal hormone therapy (MHT).  Medicines to treat specific symptoms or complications.  Acupuncture.  Vitamin or herbal  supplements. Before starting treatment, make sure to let your health care provider know if you have a personal or family history of these conditions:  Heart disease.  Breast cancer.  Blood clots.  Diabetes.  Osteoporosis. Follow these instructions at home: Lifestyle  Do not use any products that contain nicotine or tobacco, such as cigarettes, e-cigarettes, and chewing tobacco. If you need help quitting, ask your health care provider.  Get at least 30 minutes of physical activity on 5 or more days each week.  Avoid alcoholic and caffeinated beverages, as well as spicy foods. This may help prevent hot flashes.  Get 7-8 hours of sleep each night.  If you have hot flashes, try: ? Dressing in layers. ? Avoiding things that may trigger hot flashes, such as spicy food, warm places, or stress. ? Taking slow, deep breaths when a hot flash starts. ? Keeping a fan in your home and office.  Find ways to manage stress, such as deep breathing, meditation, or journaling.  Consider going to group therapy with other women who are having menopause symptoms. Ask your health care provider about recommended group therapy meetings. Eating and drinking  Eat a healthy, balanced diet that contains whole grains, lean protein, low-fat dairy, and plenty of fruits and vegetables.  Your health care provider may recommend adding more soy to your diet. Foods that contain soy include tofu, tempeh, and soy milk.  Eat plenty of foods that contain calcium and vitamin D for bone health. Items that are rich in calcium include low-fat milk, yogurt, beans, almonds, sardines, broccoli, and kale.   Medicines  Take over-the-counter and prescription medicines only as told by your health care provider.  Talk with your health care provider before starting any herbal supplements. If prescribed, take vitamins and supplements as told by your health care provider. General instructions  Keep track of your menstrual  periods, including: ? When they occur. ? How heavy they are and how long they last. ? How much time passes between periods.  Keep track of your symptoms, noting when they start, how often you have them, and how long they last.  Use vaginal lubricants or moisturizers to help with vaginal dryness and improve comfort during sex.  Keep all follow-up visits. This is important. This includes any group therapy or counseling.   Contact a health care provider if:  You are still having menstrual periods after age 85.  You have pain during sex.  You have not had a period for 12 months and you develop vaginal bleeding. Get help right away if you have:  Severe depression.  Excessive vaginal bleeding.  Pain when you urinate.  A fast or irregular heartbeat (palpitations).  Severe headaches.  Abdominal pain or severe indigestion. Summary  Menopause is a normal time of life when menstrual periods stop completely. It is usually defined as the absence of a menstrual period for 12 months without another medical cause.  The transition to menopause (perimenopause) most often happens between the ages of  26 and 55 and can last for several years.  Symptoms can be managed through medicines, lifestyle changes, and complementary therapies such as acupuncture.  Eat a balanced diet that is rich in nutrients to promote bone health and heart health and to manage symptoms during menopause. This information is not intended to replace advice given to you by your health care provider. Make sure you discuss any questions you have with your health care provider. Document Revised: 11/29/2019 Document Reviewed: 08/15/2019 Elsevier Patient Education  2021 [employer] [phone] ed., pp. [phone]). [place], [place]: Elsevier.">  Perimenopause Perimenopause is the normal time of a woman's life when the levels of estrogen, the female hormone produced by the ovaries, begin to decrease.  This leads to changes in menstrual periods before they stop completely (menopause). Perimenopause can begin 2-8 years before menopause. During perimenopause, the ovaries may or may not produce an egg and a woman can still become pregnant. What are the causes? This condition is caused by a natural change in hormone levels that happens as you get older. What increases the risk? This condition is more likely to start at an earlier age if you have certain medical conditions or have undergone treatments, including:  A tumor of the pituitary gland in the brain.  A disease that affects the ovaries and hormone production.  Certain cancer treatments, such as chemotherapy or hormone therapy, or radiation therapy on the pelvis.  Heavy smoking and excessive alcohol use.  Family history of early menopause. What are the signs or symptoms? Perimenopausal changes affect each woman differently. Symptoms of this condition may include:  Hot flashes.  Irregular menstrual periods.  Night sweats.  Changes in feelings about sex. This could be a decrease in sex drive or an increased discomfort around your sexuality.  Vaginal dryness.  Headaches.  Mood swings.  Depression.  Problems sleeping (insomnia).  Memory problems or trouble concentrating.  Irritability.  Tiredness.  Weight gain.  Anxiety.  Trouble getting pregnant. How is this diagnosed? This condition is diagnosed based on your medical history, a physical exam, your age, your menstrual history, and your symptoms. Hormone tests may also be done. How is this treated? In some cases, no treatment is needed. You and your health care provider should make a decision together about whether treatment is necessary. Treatment will be based on your individual condition and preferences. Various treatments are available, such as:  Menopausal hormone therapy (MHT).  Medicines to treat specific symptoms.  Acupuncture.  Vitamin or herbal  supplements. Before starting treatment, make sure to let your health care provider know if you have a personal or family history of:  Heart disease.  Breast cancer.  Blood clots.  Diabetes.  Osteoporosis. Follow these instructions at home: Medicines  Take over-the-counter and prescription medicines only as told by your health care provider.  Take vitamin supplements only as told by your health care provider.  Talk with your health care provider before starting any herbal supplements. Lifestyle  Do not use any products that contain nicotine or tobacco, such as cigarettes, e-cigarettes, and chewing tobacco. If you need help quitting, ask your health care provider.  Get at least 30 minutes of physical activity on 5 or more days each week.  Eat a balanced diet that includes fresh fruits and vegetables, whole grains, soybeans, eggs, lean meat, and low-fat dairy.  Avoid alcoholic and caffeinated beverages, as well as spicy foods. This may help prevent hot flashes.  Get 7-8 hours of sleep  each night.  Dress in layers that can be removed to help you manage hot flashes.  Find ways to manage stress, such as deep breathing, meditation, or journaling.   General instructions  Keep track of your menstrual periods, including: ? When they occur. ? How heavy they are and how long they last. ? How much time passes between periods.  Keep track of your symptoms, noting when they start, how often you have them, and how long they last.  Use vaginal lubricants or moisturizers to help with vaginal dryness and improve comfort during sex.  You can still become pregnant if you are having irregular periods. Make sure you use contraception during perimenopause if you do not want to get pregnant.  Keep all follow-up visits. This is important. This includes any group therapy or counseling.   Contact a health care provider if:  You have heavy vaginal bleeding or pass blood clots.  Your period  lasts more than 2 days longer than normal.  Your periods are recurring sooner than 21 days.  You bleed after having sex.  You have pain during sex. Get help right away if you have:  Chest pain, trouble breathing, or trouble talking.  Severe depression.  Pain when you urinate.  Severe headaches.  Vision problems. Summary  Perimenopause is the time when a woman's body begins to move into menopause. This may happen naturally or as a result of other health problems or medical treatments.  Perimenopause can begin 2-8 years before menopause, and it can last for several years.  Perimenopausal symptoms can be managed through medicines, lifestyle changes, and complementary therapies such as acupuncture. This information is not intended to replace advice given to you by your health care provider. Make sure you discuss any questions you have with your health care provider. Document Revised: 08/15/2019 Document Reviewed: 08/15/2019 Elsevier Patient Education  [place].

## 2020-07-20 NOTE — Progress Notes (Signed)
Pt states having night sweats and hot flashes and decreased libido last 8 months. Denies pelvic pain, irregular bleeding.

## 2020-07-21 LAB — TSH: TSH: 0.558 u[IU]/mL (ref 0.450–4.500)

## 2020-07-21 LAB — ESTRADIOL: Estradiol: 72 pg/mL

## 2020-07-21 LAB — T4: T4, Total: 7.9 ug/dL (ref 4.5–12.0)

## 2020-07-21 LAB — LUTEINIZING HORMONE: LH: 34.2 m[IU]/mL

## 2020-07-21 LAB — FOLLICLE STIMULATING HORMONE: FSH: 32.4 m[IU]/mL

## 2020-07-21 LAB — BETA HCG QUANT (REF LAB): hCG Quant: 3 m[IU]/mL

## 2020-07-24 ENCOUNTER — Other Ambulatory Visit (HOSPITAL_COMMUNITY): Payer: Medicare Other

## 2020-07-27 ENCOUNTER — Other Ambulatory Visit: Payer: Self-pay

## 2020-07-27 ENCOUNTER — Telehealth: Payer: Self-pay

## 2020-07-27 DIAGNOSIS — I69851 Hemiplegia and hemiparesis following other cerebrovascular disease affecting right dominant side: Secondary | ICD-10-CM

## 2020-07-27 DIAGNOSIS — M24574 Contracture, right foot: Secondary | ICD-10-CM

## 2020-07-27 DIAGNOSIS — M24571 Contracture, right ankle: Secondary | ICD-10-CM

## 2020-07-27 NOTE — Telephone Encounter (Signed)
Patient called to request that we reach out to PT/OT and get the ball moving on her referrals. I have sent a message to the referral coordinator Sara Chu) regarding the back and forth between her and Dr. Tommy Medal. I asked her to please clarify what is needed to move this along. Will contact patient once I get clarification.

## 2020-07-27 NOTE — Progress Notes (Signed)
Sent second PT referral as one is for PT/OT and the second PT order is just for wheelchair assessment.

## 2020-07-27 NOTE — Telephone Encounter (Signed)
Was advised by PT to order a second referral for wheelchair assessment. Ordered that with permission from Dr. Tommy Medal. I informed PT and also informed patient that PT has stated they will be reaching out to her to schedule soon.

## 2020-07-30 ENCOUNTER — Other Ambulatory Visit (HOSPITAL_COMMUNITY): Payer: Self-pay | Admitting: Obstetrics and Gynecology

## 2020-07-30 ENCOUNTER — Ambulatory Visit (HOSPITAL_COMMUNITY): Admission: RE | Admit: 2020-07-30 | Payer: Medicare Other | Source: Ambulatory Visit

## 2020-08-06 ENCOUNTER — Ambulatory Visit: Payer: Medicare Other | Admitting: Sports Medicine

## 2020-08-13 ENCOUNTER — Ambulatory Visit: Payer: Medicare Other | Admitting: Obstetrics and Gynecology

## 2020-08-18 ENCOUNTER — Ambulatory Visit: Payer: Medicare Other | Admitting: Occupational Therapy

## 2020-08-18 ENCOUNTER — Encounter: Payer: Self-pay | Admitting: Occupational Therapy

## 2020-08-18 ENCOUNTER — Other Ambulatory Visit: Payer: Self-pay

## 2020-08-18 ENCOUNTER — Encounter: Payer: Self-pay | Admitting: Physical Therapy

## 2020-08-18 ENCOUNTER — Ambulatory Visit: Payer: Medicare Other | Attending: Family Medicine | Admitting: Physical Therapy

## 2020-08-18 DIAGNOSIS — M25621 Stiffness of right elbow, not elsewhere classified: Secondary | ICD-10-CM

## 2020-08-18 DIAGNOSIS — M25611 Stiffness of right shoulder, not elsewhere classified: Secondary | ICD-10-CM

## 2020-08-18 DIAGNOSIS — I69351 Hemiplegia and hemiparesis following cerebral infarction affecting right dominant side: Secondary | ICD-10-CM | POA: Insufficient documentation

## 2020-08-18 DIAGNOSIS — R278 Other lack of coordination: Secondary | ICD-10-CM

## 2020-08-18 DIAGNOSIS — R2681 Unsteadiness on feet: Secondary | ICD-10-CM | POA: Diagnosis present

## 2020-08-18 DIAGNOSIS — M6281 Muscle weakness (generalized): Secondary | ICD-10-CM

## 2020-08-18 DIAGNOSIS — M25631 Stiffness of right wrist, not elsewhere classified: Secondary | ICD-10-CM | POA: Diagnosis present

## 2020-08-18 DIAGNOSIS — R29818 Other symptoms and signs involving the nervous system: Secondary | ICD-10-CM | POA: Diagnosis present

## 2020-08-18 DIAGNOSIS — R2689 Other abnormalities of gait and mobility: Secondary | ICD-10-CM | POA: Diagnosis present
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
## 2020-08-21 NOTE — Addendum Note (Signed)
Addended by: Arliss Journey on: 08/21/2020 08:34 AM   Modules accepted: Orders

## 2020-08-24 ENCOUNTER — Ambulatory Visit (HOSPITAL_COMMUNITY): Payer: Medicare Other | Attending: Obstetrics and Gynecology

## 2020-08-25 ENCOUNTER — Other Ambulatory Visit: Payer: Self-pay | Admitting: Infectious Diseases

## 2020-08-25 DIAGNOSIS — B20 Human immunodeficiency virus [HIV] disease: Secondary | ICD-10-CM

## 2020-08-26 ENCOUNTER — Encounter: Payer: Self-pay | Admitting: Occupational Therapy

## 2020-08-26 ENCOUNTER — Ambulatory Visit: Payer: Medicare Other | Admitting: Occupational Therapy

## 2020-08-26 ENCOUNTER — Ambulatory Visit: Payer: Medicare Other

## 2020-08-26 ENCOUNTER — Other Ambulatory Visit: Payer: Self-pay

## 2020-08-26 DIAGNOSIS — R278 Other lack of coordination: Secondary | ICD-10-CM

## 2020-08-26 DIAGNOSIS — I69351 Hemiplegia and hemiparesis following cerebral infarction affecting right dominant side: Secondary | ICD-10-CM

## 2020-08-26 DIAGNOSIS — M25611 Stiffness of right shoulder, not elsewhere classified: Secondary | ICD-10-CM

## 2020-08-26 DIAGNOSIS — M25631 Stiffness of right wrist, not elsewhere classified: Secondary | ICD-10-CM

## 2020-08-26 DIAGNOSIS — M25621 Stiffness of right elbow, not elsewhere classified: Secondary | ICD-10-CM

## 2020-08-26 DIAGNOSIS — R2689 Other abnormalities of gait and mobility: Secondary | ICD-10-CM | POA: Diagnosis not present

## 2020-08-26 DIAGNOSIS — M6281 Muscle weakness (generalized): Secondary | ICD-10-CM

## 2020-08-26 DIAGNOSIS — R2681 Unsteadiness on feet: Secondary | ICD-10-CM
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
## 2020-08-26 NOTE — Patient Instructions (Signed)
Access Code: [email or web address] URL: [email or web address] Date: 08/26/2020 Prepared by: Sharlynn Oliphant  Exercises Supine Bridge - 2 x daily - 7 x weekly - 2 sets - 10 reps Supine Heel Slide - 2 x daily - 7 x weekly - 2 sets - 10 reps Bent Knee Fallouts - 2 x daily - 7 x weekly - 2 sets - 10 reps Seated Heel Slide - 2 x daily - 7 x weekly - 3 sets - 10 reps
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
## 2020-08-27 ENCOUNTER — Ambulatory Visit (INDEPENDENT_AMBULATORY_CARE_PROVIDER_SITE_OTHER): Payer: Medicare Other | Admitting: Sports Medicine

## 2020-08-27 ENCOUNTER — Encounter: Payer: Self-pay | Admitting: Sports Medicine

## 2020-08-27 ENCOUNTER — Other Ambulatory Visit: Payer: Self-pay | Admitting: Sports Medicine

## 2020-08-27 DIAGNOSIS — M79671 Pain in right foot: Secondary | ICD-10-CM

## 2020-08-27 DIAGNOSIS — L989 Disorder of the skin and subcutaneous tissue, unspecified: Secondary | ICD-10-CM

## 2020-08-27 DIAGNOSIS — L84 Corns and callosities: Secondary | ICD-10-CM

## 2020-08-27 DIAGNOSIS — M79674 Pain in right toe(s): Secondary | ICD-10-CM

## 2020-08-27 DIAGNOSIS — B351 Tinea unguium: Secondary | ICD-10-CM

## 2020-08-27 DIAGNOSIS — B359 Dermatophytosis, unspecified: Secondary | ICD-10-CM | POA: Diagnosis not present

## 2020-08-27 DIAGNOSIS — G8191 Hemiplegia, unspecified affecting right dominant side: Secondary | ICD-10-CM | POA: Insufficient documentation

## 2020-08-27 MED ORDER — ITRACONAZOLE 100 MG PO CAPS: 100.0000 mg | ORAL_CAPSULE | Freq: Every day | ORAL | 0 refills | Status: DC

## 2020-08-27 MED ORDER — FLUCONAZOLE 100 MG PO TABS: 100.0000 mg | ORAL_TABLET | Freq: Every day | ORAL | 0 refills | Status: DC

## 2020-08-27 MED ORDER — CLOTRIMAZOLE 1 % EX SOLN: 1.0000 "application " | Freq: Two times a day (BID) | CUTANEOUS | 5 refills | Status: DC

## 2020-08-27 NOTE — Progress Notes (Signed)
Changed Itraconazole to fluconazole since insurance did not cover it. -Dr. Chauncey Cruel
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
## 2020-09-01 ENCOUNTER — Other Ambulatory Visit: Payer: Self-pay

## 2020-09-01 ENCOUNTER — Ambulatory Visit: Payer: Medicare Other

## 2020-09-01 ENCOUNTER — Ambulatory Visit: Payer: Medicare Other | Admitting: Occupational Therapy

## 2020-09-01 ENCOUNTER — Encounter: Payer: Self-pay | Admitting: Occupational Therapy

## 2020-09-01 DIAGNOSIS — R2689 Other abnormalities of gait and mobility: Secondary | ICD-10-CM

## 2020-09-01 DIAGNOSIS — R278 Other lack of coordination: Secondary | ICD-10-CM

## 2020-09-01 DIAGNOSIS — I69351 Hemiplegia and hemiparesis following cerebral infarction affecting right dominant side: Secondary | ICD-10-CM

## 2020-09-01 DIAGNOSIS — R2681 Unsteadiness on feet: Secondary | ICD-10-CM

## 2020-09-01 DIAGNOSIS — M6281 Muscle weakness (generalized): Secondary | ICD-10-CM

## 2020-09-01 DIAGNOSIS — M25611 Stiffness of right shoulder, not elsewhere classified: Secondary | ICD-10-CM

## 2020-09-01 DIAGNOSIS — M25631 Stiffness of right wrist, not elsewhere classified: Secondary | ICD-10-CM

## 2020-09-01 DIAGNOSIS — M25621 Stiffness of right elbow, not elsewhere classified: Secondary | ICD-10-CM
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
## 2020-09-02 ENCOUNTER — Ambulatory Visit: Payer: Medicare Other | Admitting: Obstetrics and Gynecology

## 2020-09-02 ENCOUNTER — Encounter: Payer: Self-pay | Admitting: Obstetrics and Gynecology

## 2020-09-03 NOTE — Progress Notes (Signed)
Patient did not keep her GYN follow up appointment for 09/02/2020.  Durene Romans MD Attending Center for [employer])

## 2020-09-04 ENCOUNTER — Ambulatory Visit: Payer: Medicare Other | Admitting: Physical Therapy

## 2020-09-04 ENCOUNTER — Other Ambulatory Visit: Payer: Self-pay

## 2020-09-04 ENCOUNTER — Encounter: Payer: Self-pay | Admitting: Physical Therapy

## 2020-09-04 DIAGNOSIS — R2689 Other abnormalities of gait and mobility: Secondary | ICD-10-CM

## 2020-09-04 DIAGNOSIS — R278 Other lack of coordination: Secondary | ICD-10-CM

## 2020-09-04 DIAGNOSIS — M6281 Muscle weakness (generalized): Secondary | ICD-10-CM
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
## 2020-09-08 ENCOUNTER — Ambulatory Visit: Payer: Medicare Other

## 2020-09-08 ENCOUNTER — Other Ambulatory Visit: Payer: Self-pay

## 2020-09-08 DIAGNOSIS — R278 Other lack of coordination: Secondary | ICD-10-CM

## 2020-09-08 DIAGNOSIS — M6281 Muscle weakness (generalized): Secondary | ICD-10-CM

## 2020-09-08 DIAGNOSIS — I69351 Hemiplegia and hemiparesis following cerebral infarction affecting right dominant side: Secondary | ICD-10-CM

## 2020-09-08 DIAGNOSIS — R2689 Other abnormalities of gait and mobility: Secondary | ICD-10-CM | POA: Diagnosis not present
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
## 2020-09-09 ENCOUNTER — Telehealth: Payer: Self-pay | Admitting: *Deleted

## 2020-09-09 NOTE — Telephone Encounter (Signed)
Patient is calling to ask Dr Cannon Kettle if she can send referral to [facility], has an appointment on 11/09/20 @ 2:40. The other office is booked up until Jan. She didn't want to wait that long. Please advise.

## 2020-09-09 NOTE — Telephone Encounter (Signed)
Faxed referral to [facility], painful lesions -09/09/20.

## 2020-09-11 ENCOUNTER — Ambulatory Visit: Payer: Medicare Other | Admitting: Occupational Therapy

## 2020-09-15 ENCOUNTER — Ambulatory Visit: Payer: Medicare Other | Admitting: Physical Therapy

## 2020-09-18 ENCOUNTER — Ambulatory Visit: Payer: Medicare Other | Admitting: Occupational Therapy

## 2020-09-22 ENCOUNTER — Encounter: Payer: Self-pay | Admitting: Occupational Therapy

## 2020-09-22 ENCOUNTER — Ambulatory Visit: Payer: Medicare Other | Admitting: Occupational Therapy

## 2020-09-22 ENCOUNTER — Ambulatory Visit: Payer: Medicare Other | Attending: Family Medicine | Admitting: Physical Therapy

## 2020-09-22 ENCOUNTER — Other Ambulatory Visit: Payer: Self-pay

## 2020-09-22 ENCOUNTER — Encounter: Payer: Self-pay | Admitting: Physical Therapy

## 2020-09-22 DIAGNOSIS — R2689 Other abnormalities of gait and mobility: Secondary | ICD-10-CM | POA: Insufficient documentation

## 2020-09-22 DIAGNOSIS — M25611 Stiffness of right shoulder, not elsewhere classified: Secondary | ICD-10-CM

## 2020-09-22 DIAGNOSIS — M25631 Stiffness of right wrist, not elsewhere classified: Secondary | ICD-10-CM | POA: Diagnosis present

## 2020-09-22 DIAGNOSIS — R278 Other lack of coordination: Secondary | ICD-10-CM | POA: Diagnosis present

## 2020-09-22 DIAGNOSIS — M6281 Muscle weakness (generalized): Secondary | ICD-10-CM | POA: Diagnosis present

## 2020-09-22 DIAGNOSIS — M25621 Stiffness of right elbow, not elsewhere classified: Secondary | ICD-10-CM

## 2020-09-22 DIAGNOSIS — R29818 Other symptoms and signs involving the nervous system: Secondary | ICD-10-CM | POA: Diagnosis present

## 2020-09-22 DIAGNOSIS — I69351 Hemiplegia and hemiparesis following cerebral infarction affecting right dominant side: Secondary | ICD-10-CM | POA: Diagnosis present

## 2020-09-22 DIAGNOSIS — R2681 Unsteadiness on feet: Secondary | ICD-10-CM | POA: Diagnosis present
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
## 2020-09-25 ENCOUNTER — Other Ambulatory Visit: Payer: Self-pay

## 2020-09-25 ENCOUNTER — Ambulatory Visit: Payer: Medicare Other | Admitting: Physical Therapy

## 2020-09-25 ENCOUNTER — Encounter: Payer: Self-pay | Admitting: Physical Therapy

## 2020-09-25 DIAGNOSIS — R29818 Other symptoms and signs involving the nervous system: Secondary | ICD-10-CM

## 2020-09-25 DIAGNOSIS — M6281 Muscle weakness (generalized): Secondary | ICD-10-CM | POA: Diagnosis not present

## 2020-09-25 DIAGNOSIS — I69351 Hemiplegia and hemiparesis following cerebral infarction affecting right dominant side: Secondary | ICD-10-CM
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
## 2020-09-29 ENCOUNTER — Encounter: Payer: Self-pay | Admitting: Physical Therapy

## 2020-09-29 ENCOUNTER — Ambulatory Visit: Payer: Medicare Other | Admitting: Physical Therapy

## 2020-09-29 ENCOUNTER — Encounter: Payer: Self-pay | Admitting: Occupational Therapy

## 2020-09-29 ENCOUNTER — Ambulatory Visit: Payer: Medicare Other | Admitting: Occupational Therapy

## 2020-09-29 ENCOUNTER — Other Ambulatory Visit: Payer: Self-pay

## 2020-09-29 DIAGNOSIS — R29818 Other symptoms and signs involving the nervous system: Secondary | ICD-10-CM

## 2020-09-29 DIAGNOSIS — M6281 Muscle weakness (generalized): Secondary | ICD-10-CM

## 2020-09-29 DIAGNOSIS — R278 Other lack of coordination: Secondary | ICD-10-CM

## 2020-09-29 DIAGNOSIS — R2681 Unsteadiness on feet: Secondary | ICD-10-CM

## 2020-09-29 DIAGNOSIS — I69351 Hemiplegia and hemiparesis following cerebral infarction affecting right dominant side: Secondary | ICD-10-CM

## 2020-09-29 DIAGNOSIS — M25611 Stiffness of right shoulder, not elsewhere classified: Secondary | ICD-10-CM

## 2020-09-29 DIAGNOSIS — M25631 Stiffness of right wrist, not elsewhere classified: Secondary | ICD-10-CM

## 2020-09-29 NOTE — Patient Instructions (Signed)
Please wear for one hour today then check skin.  If no redness, bruising, swelling can increase wearing time to 2 hours.   Can wear 2 hours/day three times a day - after meals.   Gently stretch fingers while hand is in brace.  Do not over stretch fingers.
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
## 2020-10-02 ENCOUNTER — Ambulatory Visit: Payer: Medicare Other | Admitting: Occupational Therapy

## 2020-10-06 ENCOUNTER — Ambulatory Visit: Payer: Medicare Other | Admitting: Physical Therapy

## 2020-10-06 ENCOUNTER — Ambulatory Visit: Payer: Medicare Other | Admitting: Occupational Therapy

## 2020-10-09 ENCOUNTER — Other Ambulatory Visit: Payer: Self-pay

## 2020-10-09 ENCOUNTER — Encounter: Payer: Self-pay | Admitting: Physical Therapy

## 2020-10-09 ENCOUNTER — Ambulatory Visit: Payer: Medicare Other | Admitting: Physical Therapy

## 2020-10-09 DIAGNOSIS — M6281 Muscle weakness (generalized): Secondary | ICD-10-CM | POA: Diagnosis not present

## 2020-10-09 DIAGNOSIS — R29818 Other symptoms and signs involving the nervous system: Secondary | ICD-10-CM

## 2020-10-09 DIAGNOSIS — I69351 Hemiplegia and hemiparesis following cerebral infarction affecting right dominant side: Secondary | ICD-10-CM
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
## 2020-10-13 ENCOUNTER — Other Ambulatory Visit: Payer: Self-pay

## 2020-10-13 ENCOUNTER — Ambulatory Visit: Payer: Medicare Other | Admitting: Occupational Therapy

## 2020-10-13 ENCOUNTER — Ambulatory Visit: Payer: Medicare Other | Attending: Family Medicine | Admitting: Physical Therapy

## 2020-10-13 ENCOUNTER — Encounter: Payer: Self-pay | Admitting: Physical Therapy

## 2020-10-13 DIAGNOSIS — R29818 Other symptoms and signs involving the nervous system: Secondary | ICD-10-CM

## 2020-10-13 DIAGNOSIS — R2689 Other abnormalities of gait and mobility: Secondary | ICD-10-CM | POA: Diagnosis present

## 2020-10-13 DIAGNOSIS — M6281 Muscle weakness (generalized): Secondary | ICD-10-CM

## 2020-10-13 DIAGNOSIS — R278 Other lack of coordination: Secondary | ICD-10-CM | POA: Diagnosis present

## 2020-10-13 DIAGNOSIS — I69351 Hemiplegia and hemiparesis following cerebral infarction affecting right dominant side: Secondary | ICD-10-CM

## 2020-10-13 DIAGNOSIS — M25631 Stiffness of right wrist, not elsewhere classified: Secondary | ICD-10-CM | POA: Insufficient documentation

## 2020-10-13 DIAGNOSIS — M25621 Stiffness of right elbow, not elsewhere classified: Secondary | ICD-10-CM | POA: Insufficient documentation

## 2020-10-13 DIAGNOSIS — R2681 Unsteadiness on feet: Secondary | ICD-10-CM | POA: Insufficient documentation

## 2020-10-13 DIAGNOSIS — M25611 Stiffness of right shoulder, not elsewhere classified: Secondary | ICD-10-CM | POA: Insufficient documentation
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
## 2020-10-15 ENCOUNTER — Telehealth: Payer: Self-pay | Admitting: Physical Therapy

## 2020-10-15 NOTE — Telephone Encounter (Unsigned)
Faxed as provider is not in [facility].  Dr. Nancy Fetter,  Ms. Tekeya Perlstein has been seen by Physical Therapy at [facility]. The patient would benefit from a referral to [facility] for spasticity management of RLE/RUE to help improve functional mobility.   The address is as follows: [place],  [place]  [address]  And the fax #: [phone]  Thanks so much, Janann August, PT, DPT 10/15/20 12:24 [facility] [address] [place], [place]  [address] Phone:  [phone] Fax:  [phone]

## 2020-10-16 ENCOUNTER — Ambulatory Visit: Payer: Medicare Other

## 2020-10-20 ENCOUNTER — Ambulatory Visit: Payer: Medicare Other | Admitting: Occupational Therapy

## 2020-10-20 ENCOUNTER — Ambulatory Visit: Payer: Medicare Other

## 2020-10-23 ENCOUNTER — Ambulatory Visit: Payer: Medicare Other | Admitting: Occupational Therapy

## 2020-10-27 ENCOUNTER — Ambulatory Visit: Payer: Medicare Other

## 2020-10-27 ENCOUNTER — Encounter: Payer: Self-pay | Admitting: Occupational Therapy

## 2020-10-27 ENCOUNTER — Other Ambulatory Visit: Payer: Self-pay

## 2020-10-27 ENCOUNTER — Ambulatory Visit: Payer: Medicare Other | Admitting: Occupational Therapy

## 2020-10-27 DIAGNOSIS — M25631 Stiffness of right wrist, not elsewhere classified: Secondary | ICD-10-CM

## 2020-10-27 DIAGNOSIS — I69351 Hemiplegia and hemiparesis following cerebral infarction affecting right dominant side: Secondary | ICD-10-CM

## 2020-10-27 DIAGNOSIS — M25611 Stiffness of right shoulder, not elsewhere classified: Secondary | ICD-10-CM

## 2020-10-27 DIAGNOSIS — M25621 Stiffness of right elbow, not elsewhere classified: Secondary | ICD-10-CM

## 2020-10-27 DIAGNOSIS — R29818 Other symptoms and signs involving the nervous system: Secondary | ICD-10-CM | POA: Diagnosis not present

## 2020-10-27 DIAGNOSIS — R2689 Other abnormalities of gait and mobility: Secondary | ICD-10-CM

## 2020-10-27 DIAGNOSIS — M6281 Muscle weakness (generalized): Secondary | ICD-10-CM

## 2020-10-27 DIAGNOSIS — R2681 Unsteadiness on feet: Secondary | ICD-10-CM

## 2020-10-27 DIAGNOSIS — R278 Other lack of coordination: Secondary | ICD-10-CM
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
## 2020-10-30 ENCOUNTER — Other Ambulatory Visit: Payer: Self-pay

## 2020-10-30 ENCOUNTER — Encounter: Payer: Medicare Other | Admitting: Occupational Therapy

## 2020-10-30 ENCOUNTER — Ambulatory Visit: Payer: Medicare Other

## 2020-10-30 DIAGNOSIS — R29818 Other symptoms and signs involving the nervous system: Secondary | ICD-10-CM | POA: Diagnosis not present

## 2020-10-30 DIAGNOSIS — M6281 Muscle weakness (generalized): Secondary | ICD-10-CM

## 2020-10-30 DIAGNOSIS — R278 Other lack of coordination: Secondary | ICD-10-CM

## 2020-10-30 DIAGNOSIS — I69351 Hemiplegia and hemiparesis following cerebral infarction affecting right dominant side: Secondary | ICD-10-CM
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
## 2020-11-03 ENCOUNTER — Ambulatory Visit: Payer: Medicare Other | Admitting: Physical Therapy

## 2020-11-03 ENCOUNTER — Other Ambulatory Visit: Payer: Self-pay

## 2020-11-03 ENCOUNTER — Encounter: Payer: Self-pay | Admitting: Physical Therapy

## 2020-11-03 ENCOUNTER — Encounter: Payer: Self-pay | Admitting: Physical Medicine and Rehabilitation

## 2020-11-03 ENCOUNTER — Ambulatory Visit: Payer: Medicare Other | Admitting: Occupational Therapy

## 2020-11-03 ENCOUNTER — Encounter: Payer: Self-pay | Admitting: Occupational Therapy

## 2020-11-03 DIAGNOSIS — M25621 Stiffness of right elbow, not elsewhere classified: Secondary | ICD-10-CM

## 2020-11-03 DIAGNOSIS — M6281 Muscle weakness (generalized): Secondary | ICD-10-CM

## 2020-11-03 DIAGNOSIS — R2689 Other abnormalities of gait and mobility: Secondary | ICD-10-CM

## 2020-11-03 DIAGNOSIS — I69351 Hemiplegia and hemiparesis following cerebral infarction affecting right dominant side: Secondary | ICD-10-CM

## 2020-11-03 DIAGNOSIS — R29818 Other symptoms and signs involving the nervous system: Secondary | ICD-10-CM | POA: Diagnosis not present

## 2020-11-03 DIAGNOSIS — R2681 Unsteadiness on feet: Secondary | ICD-10-CM

## 2020-11-03 DIAGNOSIS — M25631 Stiffness of right wrist, not elsewhere classified: Secondary | ICD-10-CM

## 2020-11-03 DIAGNOSIS — R278 Other lack of coordination: Secondary | ICD-10-CM

## 2020-11-03 DIAGNOSIS — M25611 Stiffness of right shoulder, not elsewhere classified: Secondary | ICD-10-CM
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
## 2020-11-06 ENCOUNTER — Ambulatory Visit: Payer: Medicare Other | Admitting: Occupational Therapy

## 2020-11-10 ENCOUNTER — Ambulatory Visit: Payer: Medicare Other | Admitting: Occupational Therapy

## 2020-11-10 ENCOUNTER — Ambulatory Visit: Payer: Medicare Other

## 2020-11-10 ENCOUNTER — Other Ambulatory Visit: Payer: Self-pay

## 2020-11-10 ENCOUNTER — Encounter: Payer: Self-pay | Admitting: Occupational Therapy

## 2020-11-10 DIAGNOSIS — R2681 Unsteadiness on feet: Secondary | ICD-10-CM

## 2020-11-10 DIAGNOSIS — R2689 Other abnormalities of gait and mobility: Secondary | ICD-10-CM

## 2020-11-10 DIAGNOSIS — M25611 Stiffness of right shoulder, not elsewhere classified: Secondary | ICD-10-CM

## 2020-11-10 DIAGNOSIS — I69351 Hemiplegia and hemiparesis following cerebral infarction affecting right dominant side: Secondary | ICD-10-CM

## 2020-11-10 DIAGNOSIS — M25621 Stiffness of right elbow, not elsewhere classified: Secondary | ICD-10-CM

## 2020-11-10 DIAGNOSIS — R278 Other lack of coordination: Secondary | ICD-10-CM

## 2020-11-10 DIAGNOSIS — M25631 Stiffness of right wrist, not elsewhere classified: Secondary | ICD-10-CM

## 2020-11-10 DIAGNOSIS — M6281 Muscle weakness (generalized): Secondary | ICD-10-CM

## 2020-11-10 DIAGNOSIS — R29818 Other symptoms and signs involving the nervous system: Secondary | ICD-10-CM | POA: Diagnosis not present
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
## 2020-11-13 ENCOUNTER — Encounter: Payer: Self-pay | Admitting: Occupational Therapy

## 2020-11-13 ENCOUNTER — Ambulatory Visit: Payer: Medicare Other

## 2020-11-13 ENCOUNTER — Ambulatory Visit: Payer: Medicare Other | Attending: Family Medicine | Admitting: Occupational Therapy

## 2020-11-13 ENCOUNTER — Other Ambulatory Visit: Payer: Self-pay

## 2020-11-13 DIAGNOSIS — M25621 Stiffness of right elbow, not elsewhere classified: Secondary | ICD-10-CM | POA: Diagnosis present

## 2020-11-13 DIAGNOSIS — R2689 Other abnormalities of gait and mobility: Secondary | ICD-10-CM | POA: Insufficient documentation

## 2020-11-13 DIAGNOSIS — M25631 Stiffness of right wrist, not elsewhere classified: Secondary | ICD-10-CM | POA: Diagnosis present

## 2020-11-13 DIAGNOSIS — M6281 Muscle weakness (generalized): Secondary | ICD-10-CM | POA: Diagnosis present

## 2020-11-13 DIAGNOSIS — R2681 Unsteadiness on feet: Secondary | ICD-10-CM | POA: Diagnosis present

## 2020-11-13 DIAGNOSIS — R278 Other lack of coordination: Secondary | ICD-10-CM | POA: Diagnosis present

## 2020-11-13 DIAGNOSIS — I69351 Hemiplegia and hemiparesis following cerebral infarction affecting right dominant side: Secondary | ICD-10-CM

## 2020-11-13 DIAGNOSIS — M25611 Stiffness of right shoulder, not elsewhere classified: Secondary | ICD-10-CM | POA: Diagnosis present
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
## 2020-11-18 ENCOUNTER — Encounter: Payer: Self-pay | Admitting: Physical Therapy

## 2020-11-18 ENCOUNTER — Ambulatory Visit: Payer: Medicare Other | Admitting: Physical Therapy

## 2020-11-18 ENCOUNTER — Other Ambulatory Visit: Payer: Self-pay

## 2020-11-18 DIAGNOSIS — I69351 Hemiplegia and hemiparesis following cerebral infarction affecting right dominant side: Secondary | ICD-10-CM

## 2020-11-18 DIAGNOSIS — R2681 Unsteadiness on feet: Secondary | ICD-10-CM

## 2020-11-18 DIAGNOSIS — R2689 Other abnormalities of gait and mobility: Secondary | ICD-10-CM

## 2020-11-18 DIAGNOSIS — M6281 Muscle weakness (generalized): Secondary | ICD-10-CM

## 2020-11-18 NOTE — Patient Instructions (Signed)
Access Code: [email or web address] URL: [email or web address] Date: 11/18/2020 Prepared by: Janann August  Exercises Supine Bridge - 2 x daily - 7 x weekly - 2 sets - 10 reps Supine Heel Slide - 2 x daily - 7 x weekly - 2 sets - 10 reps Bent Knee Fallouts - 2 x daily - 7 x weekly - 2 sets - 10 reps Seated Heel Slide - 2 x daily - 7 x weekly - 3 sets - 10 reps Staggered Sit-to-Stand - 2 x daily - 5 x weekly - 1 sets - 10 reps Alternating Step Forward with Support - 1 x daily - 5 x weekly - 2 sets - 10 reps Standing Single Leg Stance with Counter Support - 1 x daily - 5 x weekly - 3 sets - 10-15 reps
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
## 2020-11-19 NOTE — Addendum Note (Signed)
Addended by: Arliss Journey on: 11/19/2020 01:59 PM   Modules accepted: Orders

## 2020-11-24 ENCOUNTER — Ambulatory Visit: Payer: Medicare Other | Admitting: Physical Therapy

## 2020-11-24 ENCOUNTER — Ambulatory Visit: Payer: Medicare Other | Admitting: Occupational Therapy

## 2020-12-02 ENCOUNTER — Ambulatory Visit: Payer: Medicare Other

## 2020-12-09 ENCOUNTER — Ambulatory Visit: Payer: Medicare Other | Admitting: Physical Therapy

## 2021-01-06 ENCOUNTER — Ambulatory Visit (HOSPITAL_COMMUNITY)
Admission: EM | Admit: 2021-01-06 | Discharge: 2021-01-06 | Disposition: A | Discharge status: Home / Self Care | Payer: Medicare Other | Attending: Emergency Medicine | Admitting: Emergency Medicine

## 2021-01-06 ENCOUNTER — Encounter (HOSPITAL_COMMUNITY): Payer: Self-pay

## 2021-01-06 ENCOUNTER — Ambulatory Visit (INDEPENDENT_AMBULATORY_CARE_PROVIDER_SITE_OTHER): Payer: Medicare Other

## 2021-01-06 ENCOUNTER — Other Ambulatory Visit: Payer: Self-pay

## 2021-01-06 DIAGNOSIS — M79645 Pain in left finger(s): Secondary | ICD-10-CM | POA: Diagnosis not present

## 2021-01-06 DIAGNOSIS — L03012 Cellulitis of left finger: Secondary | ICD-10-CM | POA: Diagnosis not present

## 2021-01-06 NOTE — ED Triage Notes (Signed)
Pt reports pain and swelling in the left thumb x 2 weeks.

## 2021-01-06 NOTE — Discharge Instructions (Addendum)
No infection noted  X ray is negative  You will need to see a hand specialist for further testing  It is possible that the nail is pressing on the skin tissue.
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
## 2021-02-09 ENCOUNTER — Encounter: Payer: Self-pay | Admitting: Physical Medicine and Rehabilitation

## 2021-02-09 ENCOUNTER — Other Ambulatory Visit: Payer: Self-pay

## 2021-02-09 ENCOUNTER — Encounter
Payer: Medicare Other | Attending: Physical Medicine and Rehabilitation | Admitting: Physical Medicine and Rehabilitation

## 2021-02-09 VITALS — BP 121/87 | HR 78 | Temp 98.6°F | Ht 67.0 in | Wt 177.9 lb

## 2021-02-09 DIAGNOSIS — G8929 Other chronic pain: Secondary | ICD-10-CM | POA: Diagnosis present

## 2021-02-09 DIAGNOSIS — M545 Low back pain, unspecified: Secondary | ICD-10-CM | POA: Diagnosis not present

## 2021-02-09 DIAGNOSIS — I69351 Hemiplegia and hemiparesis following cerebral infarction affecting right dominant side: Secondary | ICD-10-CM | POA: Diagnosis present

## 2021-02-09 MED ORDER — TIZANIDINE HCL 2 MG PO CAPS: 2.0000 mg | ORAL_CAPSULE | Freq: Every evening | ORAL | 3 refills | Status: DC

## 2021-02-09 MED ORDER — LIDOCAINE 5 % EX PTCH: 1.0000 | MEDICATED_PATCH | CUTANEOUS | 0 refills | Status: DC
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
## 2021-02-10 ENCOUNTER — Telehealth: Payer: Self-pay | Admitting: *Deleted

## 2021-02-10 NOTE — Telephone Encounter (Signed)
Request Reference Number: [id number]. LIDOCAINE PAD 5% is approved through 03/13/2022. Your patient may now fill this prescription and it will be covered.

## 2021-02-10 NOTE — Telephone Encounter (Signed)
Prior auth submitted to insurance for lidocaine patches 5% via CoverMyMeds.

## 2021-02-23 ENCOUNTER — Encounter: Payer: Self-pay | Admitting: Physical Medicine and Rehabilitation

## 2021-02-23 ENCOUNTER — Encounter
Payer: Medicare Other | Attending: Physical Medicine and Rehabilitation | Admitting: Physical Medicine and Rehabilitation

## 2021-02-23 ENCOUNTER — Other Ambulatory Visit: Payer: Self-pay

## 2021-02-23 VITALS — BP 137/81 | HR 73 | Temp 99.3°F | Ht 67.0 in | Wt 187.0 lb

## 2021-02-23 DIAGNOSIS — I69351 Hemiplegia and hemiparesis following cerebral infarction affecting right dominant side: Secondary | ICD-10-CM

## 2021-02-23 MED ORDER — NAC 600 MG PO CAPS: 600.0000 mg | ORAL_CAPSULE | Freq: Two times a day (BID) | ORAL | 0 refills | Status: DC

## 2021-02-23 MED ORDER — TIZANIDINE HCL 4 MG PO TABS: 4.0000 mg | ORAL_TABLET | Freq: Every day | ORAL | 0 refills | Status: DC

## 2021-02-23 NOTE — Progress Notes (Signed)
Botox Injection for spasticity using needle ultrasound guidance  Indication: Severe spasticity which interferes with ADL,mobility and/or  hygiene and is unresponsive to medication management and other conservative care Informed consent was obtained after describing risks and benefits of the procedure with the patient. This includes bleeding, bruising, infection, excessive weakness, or medication side effects. A REMS form is on file and signed. Number of units per muscle FCR 100U 1 site FCU: 100U divided in 2 injection sites FDS 100 U divided in 2 injection sites FDP: 100U divided in 2 injection sites Biceps: 50U 1 site  All injections were done after obtaining appropriate EMG activity and after negative drawback for blood. The patient tolerated the procedure well. Post procedure instructions were given. A followup appointment was made.   

## 2021-02-23 NOTE — Addendum Note (Signed)
Addended by: Izora Ribas on: 02/23/2021 03:05 PM   Modules accepted: Orders

## 2021-02-23 NOTE — Patient Instructions (Signed)
N-acetyl-cysteine 600mg BID 

## 2021-03-16 ENCOUNTER — Ambulatory Visit: Payer: Medicare Other | Admitting: Dermatology

## 2021-03-30 ENCOUNTER — Other Ambulatory Visit: Payer: Self-pay | Admitting: Physical Medicine and Rehabilitation

## 2021-04-06 ENCOUNTER — Other Ambulatory Visit: Payer: Self-pay | Admitting: Physical Medicine and Rehabilitation

## 2021-05-05 ENCOUNTER — Other Ambulatory Visit: Payer: Self-pay | Admitting: Physical Medicine and Rehabilitation

## 2021-05-28 ENCOUNTER — Other Ambulatory Visit: Payer: Self-pay

## 2021-05-28 ENCOUNTER — Telehealth: Payer: Self-pay

## 2021-05-28 ENCOUNTER — Ambulatory Visit (INDEPENDENT_AMBULATORY_CARE_PROVIDER_SITE_OTHER): Payer: Medicare Other | Admitting: Internal Medicine

## 2021-05-28 DIAGNOSIS — J069 Acute upper respiratory infection, unspecified: Secondary | ICD-10-CM
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
## 2021-05-28 NOTE — Telephone Encounter (Signed)
Patient called office today stating she has had a cold for three days. Is having productive cough and congestion. Denies having fever. Has not tested for covid. Is requesting Z pack be sent to pharmacy.  ?Offered patient phone visit with Dr. Candiss Norse. Front desk will schedule appt.  ?Leatrice Jewels, RMA ? ?

## 2021-05-31 ENCOUNTER — Ambulatory Visit: Payer: Medicare Other | Admitting: Internal Medicine

## 2021-06-01 ENCOUNTER — Other Ambulatory Visit: Payer: Self-pay | Admitting: Infectious Disease

## 2021-06-01 ENCOUNTER — Other Ambulatory Visit: Payer: Self-pay | Admitting: Family Medicine

## 2021-06-01 DIAGNOSIS — Z1231 Encounter for screening mammogram for malignant neoplasm of breast: Secondary | ICD-10-CM

## 2021-06-01 DIAGNOSIS — F419 Anxiety disorder, unspecified: Secondary | ICD-10-CM

## 2021-06-01 DIAGNOSIS — Z79899 Other long term (current) drug therapy: Secondary | ICD-10-CM

## 2021-06-01 DIAGNOSIS — I69851 Hemiplegia and hemiparesis following other cerebrovascular disease affecting right dominant side: Secondary | ICD-10-CM

## 2021-06-01 DIAGNOSIS — Z113 Encounter for screening for infections with a predominantly sexual mode of transmission: Secondary | ICD-10-CM

## 2021-06-01 DIAGNOSIS — F4310 Post-traumatic stress disorder, unspecified: Secondary | ICD-10-CM

## 2021-06-01 DIAGNOSIS — B20 Human immunodeficiency virus [HIV] disease: Secondary | ICD-10-CM

## 2021-06-01 DIAGNOSIS — A812 Progressive multifocal leukoencephalopathy: Secondary | ICD-10-CM

## 2021-06-01 DIAGNOSIS — N926 Irregular menstruation, unspecified: Secondary | ICD-10-CM

## 2021-06-08 DIAGNOSIS — Z03818 Encounter for observation for suspected exposure to other biological agents ruled out: Secondary | ICD-10-CM | POA: Diagnosis not present

## 2021-06-08 DIAGNOSIS — G8191 Hemiplegia, unspecified affecting right dominant side: Secondary | ICD-10-CM | POA: Diagnosis not present

## 2021-06-08 DIAGNOSIS — J069 Acute upper respiratory infection, unspecified: Secondary | ICD-10-CM | POA: Diagnosis not present

## 2021-06-08 DIAGNOSIS — G8190 Hemiplegia, unspecified affecting unspecified side: Secondary | ICD-10-CM | POA: Diagnosis not present

## 2021-06-08 DIAGNOSIS — Z1389 Encounter for screening for other disorder: Secondary | ICD-10-CM | POA: Diagnosis not present

## 2021-06-10 ENCOUNTER — Encounter
Payer: Medicare Other | Attending: Physical Medicine and Rehabilitation | Admitting: Physical Medicine and Rehabilitation

## 2021-06-10 VITALS — BP 134/87 | HR 74 | Ht 67.0 in | Wt 186.0 lb

## 2021-06-10 DIAGNOSIS — I69351 Hemiplegia and hemiparesis following cerebral infarction affecting right dominant side: Secondary | ICD-10-CM | POA: Diagnosis not present

## 2021-06-10 NOTE — Progress Notes (Signed)
Botox Injection for spasticity using needle ultrasound guidance  Indication: Severe spasticity which interferes with ADL,mobility and/or  hygiene and is unresponsive to medication management and other conservative care Informed consent was obtained after describing risks and benefits of the procedure with the patient. This includes bleeding, bruising, infection, excessive weakness, or medication side effects. A REMS form is on file and signed. Number of units per muscle FCR 100U 1 site FCU: 100U divided in 2 injection sites FDS 100 U divided in 2 injection sites FDP: 100U divided in 2 injection sites Biceps: 50U 1 site  All injections were done after obtaining appropriate EMG activity and after negative drawback for blood. The patient tolerated the procedure well. Post procedure instructions were given. A followup appointment was made.   

## 2021-06-16 ENCOUNTER — Other Ambulatory Visit: Payer: Medicare Other

## 2021-06-16 ENCOUNTER — Other Ambulatory Visit: Payer: Self-pay

## 2021-06-16 ENCOUNTER — Encounter: Payer: Self-pay | Admitting: Infectious Diseases

## 2021-06-16 ENCOUNTER — Ambulatory Visit (INDEPENDENT_AMBULATORY_CARE_PROVIDER_SITE_OTHER): Payer: Medicare Other | Admitting: Infectious Diseases

## 2021-06-16 ENCOUNTER — Other Ambulatory Visit (HOSPITAL_COMMUNITY)
Admission: RE | Admit: 2021-06-16 | Discharge: 2021-06-16 | Disposition: A | Discharge status: Home / Self Care | Payer: Medicare Other | Source: Ambulatory Visit | Attending: Infectious Diseases | Admitting: Infectious Diseases

## 2021-06-16 DIAGNOSIS — Z113 Encounter for screening for infections with a predominantly sexual mode of transmission: Secondary | ICD-10-CM

## 2021-06-16 DIAGNOSIS — Z01419 Encounter for gynecological examination (general) (routine) without abnormal findings: Secondary | ICD-10-CM | POA: Diagnosis present

## 2021-06-16 DIAGNOSIS — I69851 Hemiplegia and hemiparesis following other cerebrovascular disease affecting right dominant side: Secondary | ICD-10-CM

## 2021-06-16 DIAGNOSIS — Z79899 Other long term (current) drug therapy: Secondary | ICD-10-CM

## 2021-06-16 DIAGNOSIS — B20 Human immunodeficiency virus [HIV] disease: Secondary | ICD-10-CM

## 2021-06-16 DIAGNOSIS — F4310 Post-traumatic stress disorder, unspecified: Secondary | ICD-10-CM

## 2021-06-16 DIAGNOSIS — A812 Progressive multifocal leukoencephalopathy: Secondary | ICD-10-CM

## 2021-06-16 DIAGNOSIS — Z599 Problem related to housing and economic circumstances, unspecified: Secondary | ICD-10-CM

## 2021-06-16 DIAGNOSIS — Z1151 Encounter for screening for human papillomavirus (HPV): Secondary | ICD-10-CM | POA: Insufficient documentation

## 2021-06-16 DIAGNOSIS — F419 Anxiety disorder, unspecified: Secondary | ICD-10-CM

## 2021-06-16 DIAGNOSIS — Z124 Encounter for screening for malignant neoplasm of cervix: Secondary | ICD-10-CM

## 2021-06-16 DIAGNOSIS — M24574 Contracture, right foot: Secondary | ICD-10-CM
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
## 2021-06-16 NOTE — Addendum Note (Signed)
Addended by: Eugenia Mcalpine on: 06/16/2021 04:18 PM ? ? Modules accepted: Orders ? ?

## 2021-06-16 NOTE — Assessment & Plan Note (Signed)
Normal pelvic exam aside from increased white discharge - suspect BV. Will collect drainage sample today and prescribe appropriate medication. She does not seem to be too bothered by vaginitis symptoms at present.  ?Cervical brushing collected for cytology and HPV.  ?Discussed recommended screening interval for women living with HIV disease meant to be lifelong and at an interval of Q1-3 years pending results. Acceptable to space out Q3y with 3 consecutively normal exams. Further recommendations for Paula Martinez to follow today's results.  ?Results will be communicated to the patient via telephone call.  ? ?

## 2021-06-17 LAB — T-HELPER CELL (CD4) - (RCID CLINIC ONLY)
CD4 % Helper T Cell: 30 % — ABNORMAL LOW (ref 33–65)
CD4 T Cell Abs: 895 /uL (ref 400–1790)

## 2021-06-17 LAB — CERVICOVAGINAL ANCILLARY ONLY
Bacterial Vaginitis (gardnerella): POSITIVE — AB
Candida Glabrata: NEGATIVE
Candida Vaginitis: NEGATIVE
Chlamydia: NEGATIVE
Comment: NEGATIVE
Comment: NEGATIVE
Comment: NEGATIVE
Comment: NEGATIVE
Comment: NEGATIVE
Comment: NORMAL
Neisseria Gonorrhea: NEGATIVE
Trichomonas: NEGATIVE

## 2021-06-20 LAB — CBC WITH DIFFERENTIAL/PLATELET
Absolute Monocytes: 429 cells/uL (ref 200–950)
Basophils Absolute: 40 cells/uL (ref 0–200)
Basophils Relative: 0.6 %
Eosinophils Absolute: 139 cells/uL (ref 15–500)
Eosinophils Relative: 2.1 %
HCT: 41.9 % (ref 35.0–45.0)
Hemoglobin: 14 g/dL (ref 11.7–15.5)
Lymphs Abs: 3333 cells/uL (ref 850–3900)
MCH: 28.2 pg (ref 27.0–33.0)
MCHC: 33.4 g/dL (ref 32.0–36.0)
MCV: 84.5 fL (ref 80.0–100.0)
MPV: 12 fL (ref 7.5–12.5)
Monocytes Relative: 6.5 %
Neutro Abs: 2660 cells/uL (ref 1500–7800)
Neutrophils Relative %: 40.3 %
Platelets: 313 10*3/uL (ref 140–400)
RBC: 4.96 10*6/uL (ref 3.80–5.10)
RDW: 13.4 % (ref 11.0–15.0)
Total Lymphocyte: 50.5 %
WBC: 6.6 10*3/uL (ref 3.8–10.8)

## 2021-06-20 LAB — COMPLETE METABOLIC PANEL WITH GFR
AG Ratio: 1.3 (calc) (ref 1.0–2.5)
ALT: 10 U/L (ref 6–29)
AST: 12 U/L (ref 10–35)
Albumin: 4.1 g/dL (ref 3.6–5.1)
Alkaline phosphatase (APISO): 96 U/L (ref 37–153)
BUN: 7 mg/dL (ref 7–25)
CO2: 25 mmol/L (ref 20–32)
Calcium: 9.5 mg/dL (ref 8.6–10.4)
Chloride: 110 mmol/L (ref 98–110)
Creat: 0.71 mg/dL (ref 0.50–1.03)
Globulin: 3.2 g/dL (calc) (ref 1.9–3.7)
Glucose, Bld: 84 mg/dL (ref 65–99)
Potassium: 4 mmol/L (ref 3.5–5.3)
Sodium: 142 mmol/L (ref 135–146)
Total Bilirubin: 0.4 mg/dL (ref 0.2–1.2)
Total Protein: 7.3 g/dL (ref 6.1–8.1)
eGFR: 104 mL/min/{1.73_m2} (ref >=60)

## 2021-06-20 LAB — HIV-1 RNA QUANT-NO REFLEX-BLD
HIV 1 RNA Quant: NOT DETECTED Copies/mL
HIV-1 RNA Quant, Log: NOT DETECTED Log cps/mL

## 2021-06-20 LAB — RPR: RPR Ser Ql: NONREACTIVE

## 2021-06-21 ENCOUNTER — Ambulatory Visit: Payer: Medicare Other | Admitting: Infectious Disease

## 2021-06-21 ENCOUNTER — Telehealth: Payer: Self-pay

## 2021-06-21 NOTE — Telephone Encounter (Signed)
Patient called requesting lab results - patient aware all results aren't back yet once resulted we will give her a call back. ? ? ?Dartanion Teo P Alfredo Spong, CMA ? ?

## 2021-06-22 ENCOUNTER — Ambulatory Visit
Admission: RE | Admit: 2021-06-22 | Discharge: 2021-06-22 | Disposition: A | Discharge status: Home / Self Care | Payer: Medicare Other | Source: Ambulatory Visit | Attending: Family Medicine | Admitting: Family Medicine

## 2021-06-22 ENCOUNTER — Telehealth: Payer: Self-pay

## 2021-06-22 ENCOUNTER — Telehealth: Payer: Self-pay | Admitting: Infectious Diseases

## 2021-06-22 DIAGNOSIS — Z1231 Encounter for screening mammogram for malignant neoplasm of breast: Secondary | ICD-10-CM

## 2021-06-22 LAB — CYTOLOGY - PAP
Adequacy: ABSENT
Comment: NEGATIVE
Diagnosis: NEGATIVE
High risk HPV: NEGATIVE

## 2021-06-22 MED ORDER — METRONIDAZOLE 500 MG PO TABS: 500.0000 mg | ORAL_TABLET | Freq: Two times a day (BID) | ORAL | 0 refills | Status: DC

## 2021-06-22 NOTE — Addendum Note (Signed)
Addended by: [place] Callas on: 06/22/2021 02:45 PM ? ? Modules accepted: Orders ? ?

## 2021-06-22 NOTE — Telephone Encounter (Signed)
I left a detailed message on patient secured voicemail letting her know that DR. Tommy Medal will be unable to feel out paperwork for her power wheelchair. I also advised her that this will be something that her pcp should fill out or Rehab.  ?Paula Martinez, CMA  ?

## 2021-06-22 NOTE — Telephone Encounter (Signed)
Attempted to reach patient with lab results - LVM requesting call back ? ? ? ?Your pap smear results are normal - no concern for abnormal cells. I would recommend we repeat this for you in 3 years.  ?You did have an overgrowth of bacteria detected. This is not a sexually transmitted infection but could be causing some itching, pain or abdominal/vaginal discomfort. We can cure this for you with a week of an antibiotic taken twice a day called Metronidazole. I will send this in for you to your pharmacy.  ? ?~Paula Martinez ?

## 2021-06-29 ENCOUNTER — Encounter: Payer: Self-pay | Admitting: Infectious Disease

## 2021-06-30 ENCOUNTER — Ambulatory Visit: Payer: Medicare Other | Admitting: Infectious Disease

## 2021-06-30 ENCOUNTER — Other Ambulatory Visit: Payer: Self-pay | Admitting: Physical Medicine and Rehabilitation

## 2021-07-02 ENCOUNTER — Other Ambulatory Visit: Payer: Self-pay | Admitting: Infectious Disease

## 2021-07-02 DIAGNOSIS — I69851 Hemiplegia and hemiparesis following other cerebrovascular disease affecting right dominant side: Secondary | ICD-10-CM

## 2021-07-02 DIAGNOSIS — F419 Anxiety disorder, unspecified: Secondary | ICD-10-CM

## 2021-07-02 DIAGNOSIS — B20 Human immunodeficiency virus [HIV] disease: Secondary | ICD-10-CM

## 2021-07-02 DIAGNOSIS — Z79899 Other long term (current) drug therapy: Secondary | ICD-10-CM

## 2021-07-02 DIAGNOSIS — N926 Irregular menstruation, unspecified: Secondary | ICD-10-CM

## 2021-07-02 DIAGNOSIS — A812 Progressive multifocal leukoencephalopathy: Secondary | ICD-10-CM

## 2021-07-02 DIAGNOSIS — Z113 Encounter for screening for infections with a predominantly sexual mode of transmission: Secondary | ICD-10-CM

## 2021-07-02 DIAGNOSIS — F4310 Post-traumatic stress disorder, unspecified: Secondary | ICD-10-CM

## 2021-07-09 DIAGNOSIS — G8190 Hemiplegia, unspecified affecting unspecified side: Secondary | ICD-10-CM | POA: Diagnosis not present

## 2021-07-14 ENCOUNTER — Encounter: Payer: Self-pay | Admitting: Infectious Disease

## 2021-07-14 ENCOUNTER — Ambulatory Visit (INDEPENDENT_AMBULATORY_CARE_PROVIDER_SITE_OTHER): Payer: Medicare Other | Admitting: Infectious Disease

## 2021-07-14 ENCOUNTER — Other Ambulatory Visit: Payer: Self-pay

## 2021-07-14 VITALS — BP 137/93 | HR 68 | Temp 98.1°F | Ht 67.0 in | Wt 192.0 lb

## 2021-07-14 DIAGNOSIS — Z79899 Other long term (current) drug therapy: Secondary | ICD-10-CM | POA: Diagnosis not present

## 2021-07-14 DIAGNOSIS — N926 Irregular menstruation, unspecified: Secondary | ICD-10-CM

## 2021-07-14 DIAGNOSIS — B20 Human immunodeficiency virus [HIV] disease: Secondary | ICD-10-CM | POA: Diagnosis not present

## 2021-07-14 DIAGNOSIS — I69851 Hemiplegia and hemiparesis following other cerebrovascular disease affecting right dominant side: Secondary | ICD-10-CM | POA: Diagnosis not present

## 2021-07-14 DIAGNOSIS — M24574 Contracture, right foot: Secondary | ICD-10-CM | POA: Diagnosis not present

## 2021-07-14 DIAGNOSIS — Z113 Encounter for screening for infections with a predominantly sexual mode of transmission: Secondary | ICD-10-CM

## 2021-07-14 DIAGNOSIS — I69351 Hemiplegia and hemiparesis following cerebral infarction affecting right dominant side: Secondary | ICD-10-CM | POA: Diagnosis not present

## 2021-07-14 DIAGNOSIS — A812 Progressive multifocal leukoencephalopathy: Secondary | ICD-10-CM | POA: Diagnosis not present

## 2021-07-14 DIAGNOSIS — M24571 Contracture, right ankle: Secondary | ICD-10-CM

## 2021-07-14 DIAGNOSIS — F419 Anxiety disorder, unspecified: Secondary | ICD-10-CM

## 2021-07-14 DIAGNOSIS — F4310 Post-traumatic stress disorder, unspecified: Secondary | ICD-10-CM

## 2021-07-14 MED ORDER — SYMTUZA 800-150-200-10 MG PO TABS: 1.0000 | ORAL_TABLET | Freq: Every day | ORAL | 11 refills | Status: DC
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
## 2021-07-25 IMAGING — CT CT ABD-PELV W/ CM
2 of 5 series · 15 of 46 positions shown, 17 images · IV contrast (Omni 300)
Comparison: None.

CLINICAL DATA: Lower abdominal pain with fever

EXAM:
CT ABDOMEN AND PELVIS WITH CONTRAST
TECHNIQUE: Multidetector CT imaging of the abdomen and pelvis was performed
using the standard protocol following bolus administration of
intravenous contrast.
CONTRAST:  100mL OMNIPAQUE IOHEXOL 300 MG/ML  SOLN

[Series 3: a/p w/ 5mm · axial · 0.90mm/px · z∈[+743,+1168]mm · 12 of 97 slices shown, 14 images]
[im 6/97  soft-tissue]
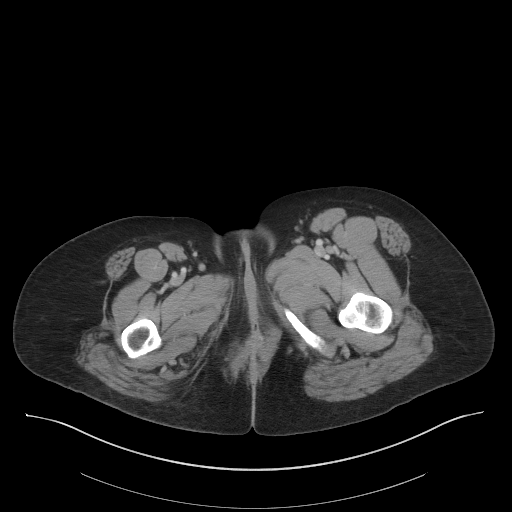
[im 6/97  bone]
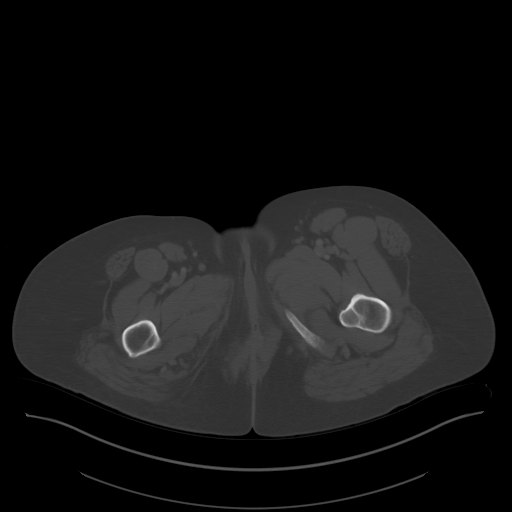
[im 16/97  soft-tissue]
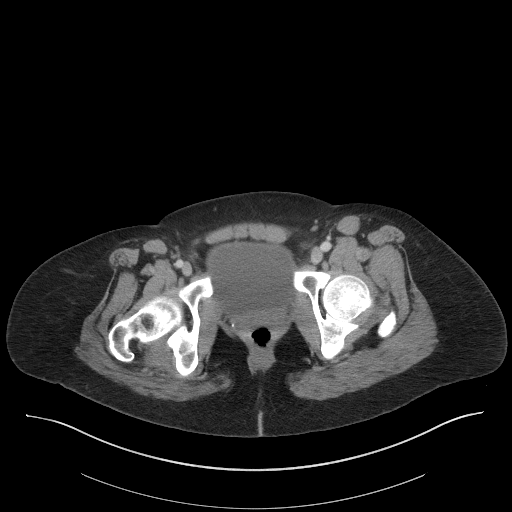
[im 21/97  soft-tissue]
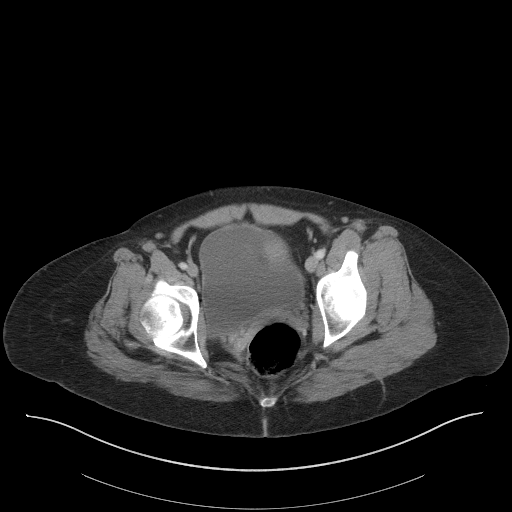
[im 31/97  soft-tissue]
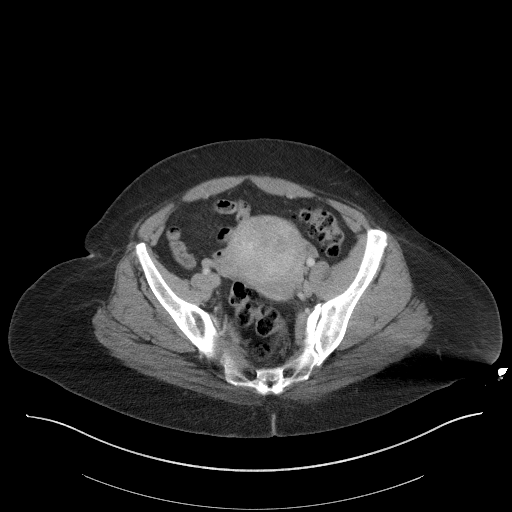
[im 36/97  soft-tissue]
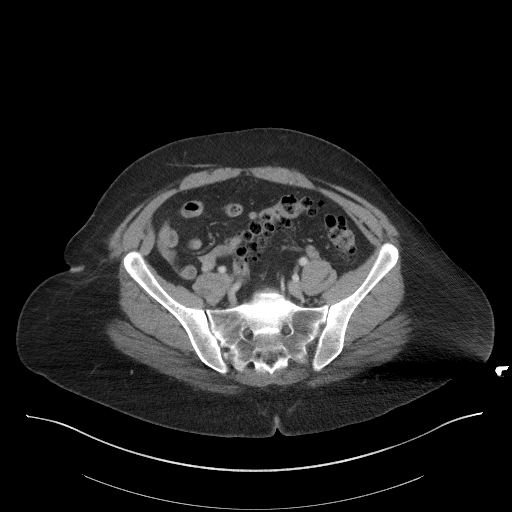
[im 46/97  soft-tissue]
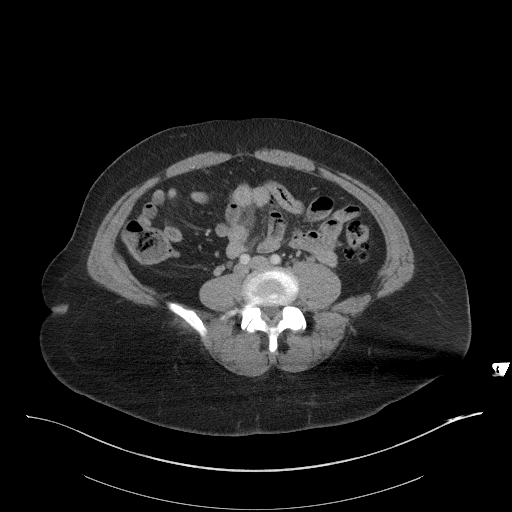
[im 51/97  soft-tissue]
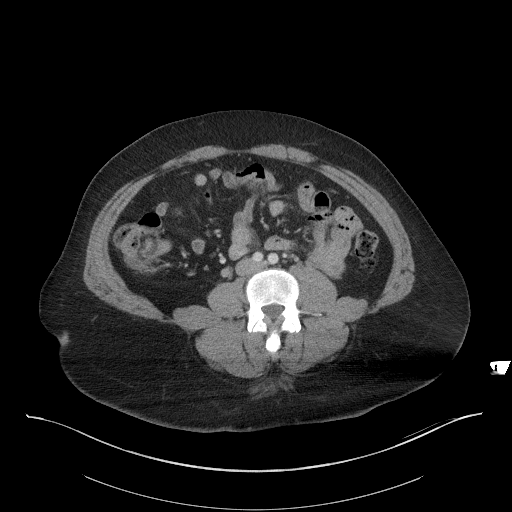
[im 61/97  soft-tissue]
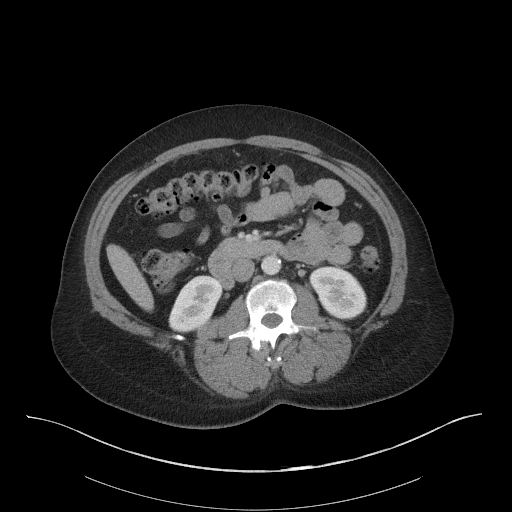
[im 66/97  soft-tissue]
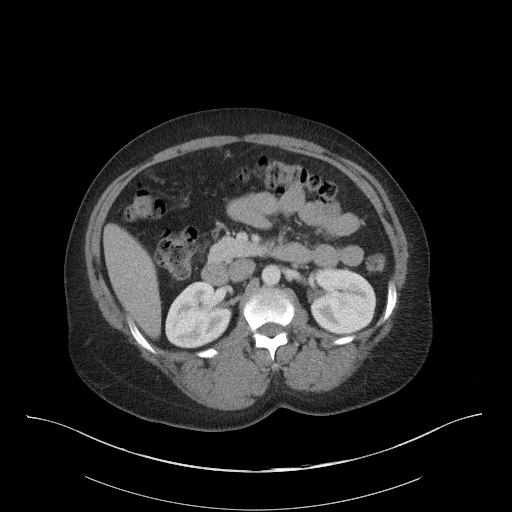
[im 66/97  bone]
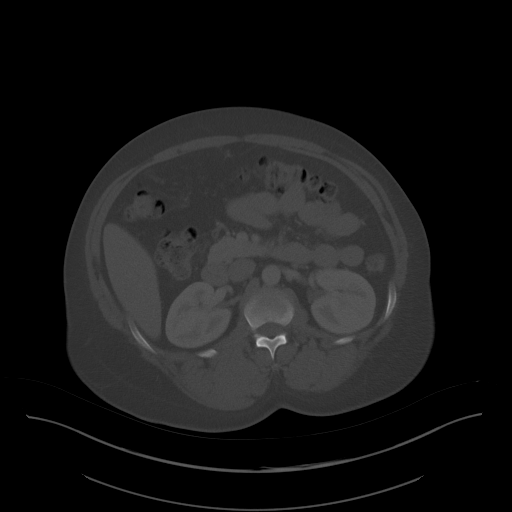
[im 76/97  soft-tissue]
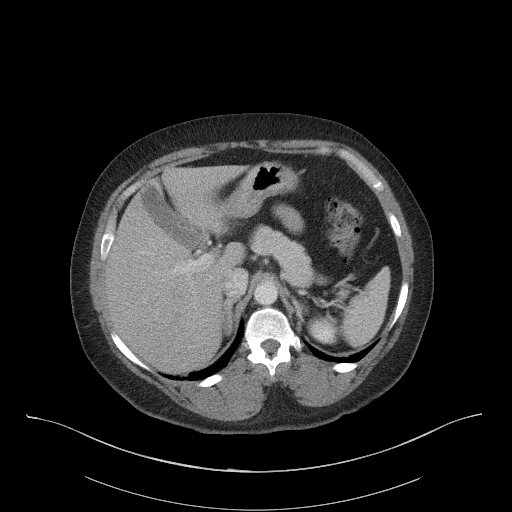
[im 81/97  soft-tissue]
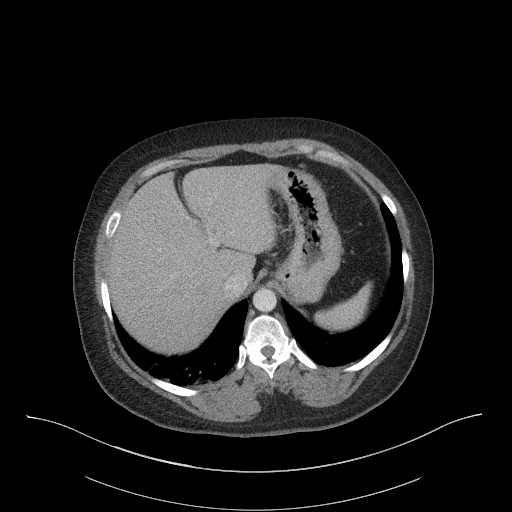
[im 91/97  soft-tissue]
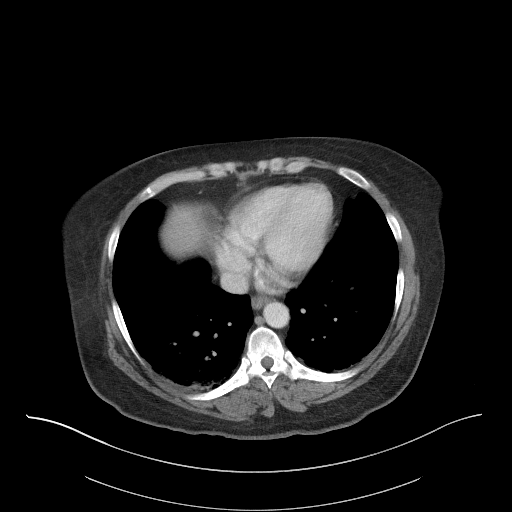

[Series 6: a/p w/ cor · coronal · 0.77mm/px · 3 of 151 slices shown]
[im 51/151  soft-tissue]
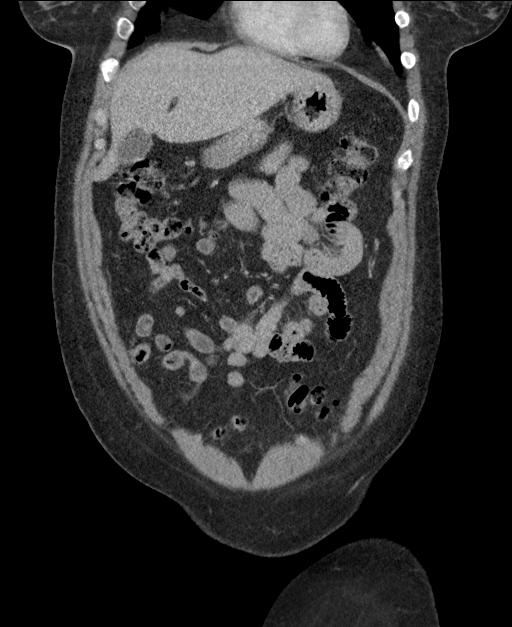
[im 67/151  soft-tissue]
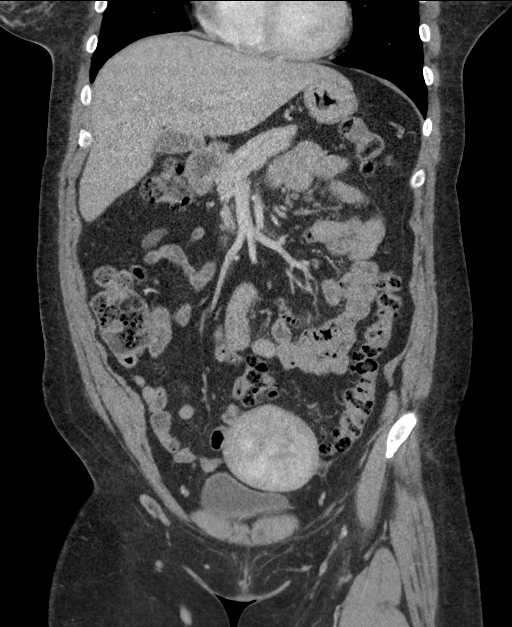
[im 84/151  soft-tissue]
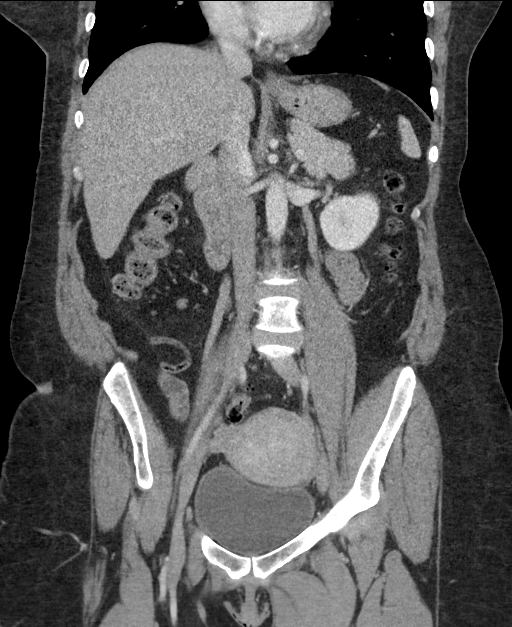

[15 of 46 positions shown; findings below may reference images not displayed]

FINDINGS: Lower chest: There is airspace opacity in the right lower lobe
posteriorly, likely representing a degree of pneumonia. There is
bibasilar atelectasis as well.

Hepatobiliary: No focal liver lesions are evident. The gallbladder
wall is not appreciably thickened. There is no biliary duct
dilatation.

Pancreas: No pancreatic mass or inflammatory focus.

Spleen: No splenic lesions are evident.

Adrenals/Urinary Tract: Adrenals bilaterally appear unremarkable.
Kidneys bilaterally show no evident mass or hydronephrosis on either
side. There is no evident renal or ureteral calculus on either side.
The urinary bladder is midline with wall thickness within normal
limits.

Stomach/Bowel: There are sigmoid diverticula without evident
diverticulitis. There is no appreciable bowel wall or mesenteric
thickening. The terminal ileum appears normal. There is fatty
infiltration in the ileocecal valve. There is no demonstrable bowel
obstruction. There is no free air or portal venous air.

Vascular/Lymphatic: There is no abdominal aortic aneurysm. There are
foci of aortic and iliac artery atherosclerotic calcification. Major
venous structures appear patent. There is no evident adenopathy in
the abdomen or pelvis.

Reproductive: The uterus is anteverted. Uterus shows inhomogeneous
enhancement, likely due to leiomyomatous change throughout the
uterus. No extrauterine pelvic mass evident.

Other: The appendix appears normal. There is no evident abscess or
ascites in the abdomen or pelvis. There is mild fat in the
umbilicus.

Musculoskeletal: No blastic or lytic bone lesions are evident. There
is no intramuscular lesion.
IMPRESSION: 1. Focus of apparent pneumonia posterior right base. Bibasilar
atelectasis noted.

2. There are sigmoid diverticula without evident diverticulitis. No
bowel wall thickening or bowel obstruction evident. No abscess in
the abdomen or pelvis. Appendix appears normal.

3. No renal or ureteral calculi. No hydronephrosis. Urinary bladder
wall thickness normal.

4.  Leiomyomatous uterus.

5.  Aortic Atherosclerosis ([id number]).

## 2021-08-08 DIAGNOSIS — G8190 Hemiplegia, unspecified affecting unspecified side: Secondary | ICD-10-CM | POA: Diagnosis not present

## 2021-09-02 ENCOUNTER — Encounter
Payer: Medicare Other | Attending: Physical Medicine and Rehabilitation | Admitting: Physical Medicine and Rehabilitation

## 2021-09-02 DIAGNOSIS — I69351 Hemiplegia and hemiparesis following cerebral infarction affecting right dominant side: Secondary | ICD-10-CM | POA: Insufficient documentation

## 2021-09-08 DIAGNOSIS — G8190 Hemiplegia, unspecified affecting unspecified side: Secondary | ICD-10-CM | POA: Diagnosis not present

## 2021-10-08 DIAGNOSIS — G8190 Hemiplegia, unspecified affecting unspecified side: Secondary | ICD-10-CM | POA: Diagnosis not present

## 2021-11-08 DIAGNOSIS — G8190 Hemiplegia, unspecified affecting unspecified side: Secondary | ICD-10-CM | POA: Diagnosis not present

## 2021-11-17 DIAGNOSIS — G8191 Hemiplegia, unspecified affecting right dominant side: Secondary | ICD-10-CM | POA: Diagnosis not present

## 2021-11-25 ENCOUNTER — Encounter
Payer: Medicare Other | Attending: Physical Medicine and Rehabilitation | Admitting: Physical Medicine and Rehabilitation

## 2021-11-25 ENCOUNTER — Encounter: Payer: Self-pay | Admitting: Physical Medicine and Rehabilitation

## 2021-11-25 ENCOUNTER — Ambulatory Visit: Payer: Medicare Other | Admitting: Physical Medicine and Rehabilitation

## 2021-11-25 VITALS — BP 125/83 | HR 77 | Ht 67.0 in | Wt 192.4 lb

## 2021-11-25 DIAGNOSIS — M24531 Contracture, right wrist: Secondary | ICD-10-CM | POA: Insufficient documentation

## 2021-11-25 DIAGNOSIS — I69351 Hemiplegia and hemiparesis following cerebral infarction affecting right dominant side: Secondary | ICD-10-CM | POA: Insufficient documentation

## 2021-11-25 NOTE — Progress Notes (Signed)
Botox Injection for spasticity using needle ultrasound guidance  Indication: Severe spasticity which interferes with ADL,mobility and/or  hygiene and is unresponsive to medication management and other conservative care Informed consent was obtained after describing risks and benefits of the procedure with the patient. This includes bleeding, bruising, infection, excessive weakness, or medication side effects. A REMS form is on file and signed. Number of units per muscle FCR 100U 1 site FCU: 100U divided in 2 injection sites FDS 100 U divided in 2 injection sites FDP: 100U divided in 2 injection sites Biceps: 50U 1 site  All injections were done after obtaining appropriate EMG activity and after negative drawback for blood. The patient tolerated the procedure well. Post procedure instructions were given. A followup appointment was made.   

## 2021-12-07 DIAGNOSIS — G8191 Hemiplegia, unspecified affecting right dominant side: Secondary | ICD-10-CM | POA: Diagnosis not present

## 2021-12-09 ENCOUNTER — Encounter: Payer: Medicare Other | Admitting: Physical Medicine and Rehabilitation

## 2021-12-09 ENCOUNTER — Telehealth: Payer: Self-pay | Admitting: Physical Medicine and Rehabilitation

## 2021-12-09 DIAGNOSIS — M24531 Contracture, right wrist: Secondary | ICD-10-CM

## 2021-12-09 NOTE — Addendum Note (Signed)
Addended by: Izora Ribas on: 12/09/2021 10:45 AM   Modules accepted: Orders

## 2021-12-09 NOTE — Telephone Encounter (Signed)
Patient would like for you to call her °

## 2021-12-10 NOTE — Progress Notes (Signed)
Discussed referral to ortho for wrist contracture

## 2021-12-21 ENCOUNTER — Ambulatory Visit (INDEPENDENT_AMBULATORY_CARE_PROVIDER_SITE_OTHER): Payer: Medicare Other | Admitting: Orthopaedic Surgery

## 2021-12-21 ENCOUNTER — Encounter: Payer: Self-pay | Admitting: Orthopaedic Surgery

## 2021-12-21 DIAGNOSIS — M21331 Wrist drop, right wrist: Secondary | ICD-10-CM | POA: Diagnosis not present

## 2021-12-21 DIAGNOSIS — M21371 Foot drop, right foot: Secondary | ICD-10-CM
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
## 2021-12-28 ENCOUNTER — Ambulatory Visit (INDEPENDENT_AMBULATORY_CARE_PROVIDER_SITE_OTHER): Payer: Medicare Other

## 2021-12-28 ENCOUNTER — Ambulatory Visit: Payer: Medicare Other | Admitting: Orthopaedic Surgery

## 2021-12-28 ENCOUNTER — Ambulatory Visit (INDEPENDENT_AMBULATORY_CARE_PROVIDER_SITE_OTHER): Payer: Medicare Other | Admitting: Orthopedic Surgery

## 2021-12-28 DIAGNOSIS — M21371 Foot drop, right foot: Secondary | ICD-10-CM

## 2021-12-28 DIAGNOSIS — M2031 Hallux varus (acquired), right foot: Secondary | ICD-10-CM | POA: Diagnosis not present

## 2021-12-28 DIAGNOSIS — M24571 Contracture, right ankle: Secondary | ICD-10-CM | POA: Diagnosis not present

## 2021-12-29 ENCOUNTER — Encounter: Payer: Self-pay | Admitting: Orthopedic Surgery
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
## 2022-01-11 DIAGNOSIS — M24531 Contracture, right wrist: Secondary | ICD-10-CM | POA: Diagnosis not present

## 2022-01-11 DIAGNOSIS — M24521 Contracture, right elbow: Secondary | ICD-10-CM | POA: Diagnosis not present

## 2022-01-11 DIAGNOSIS — M24541 Contracture, right hand: Secondary | ICD-10-CM | POA: Diagnosis not present

## 2022-01-17 ENCOUNTER — Ambulatory Visit (INDEPENDENT_AMBULATORY_CARE_PROVIDER_SITE_OTHER): Payer: Medicare Other | Admitting: Orthopedic Surgery

## 2022-01-17 DIAGNOSIS — M21371 Foot drop, right foot: Secondary | ICD-10-CM

## 2022-01-17 DIAGNOSIS — M24571 Contracture, right ankle: Secondary | ICD-10-CM

## 2022-01-17 DIAGNOSIS — M2031 Hallux varus (acquired), right foot: Secondary | ICD-10-CM | POA: Diagnosis not present

## 2022-01-18 ENCOUNTER — Encounter: Payer: Self-pay | Admitting: Orthopedic Surgery
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
## 2022-02-02 DIAGNOSIS — M24521 Contracture, right elbow: Secondary | ICD-10-CM | POA: Diagnosis not present

## 2022-02-11 ENCOUNTER — Telehealth: Payer: Self-pay

## 2022-02-11 NOTE — Telephone Encounter (Signed)
Additional information received from [facility] at [facility] regarding needed records.  Discussed with staff member at [facility] department. Updated ROI faxed with requested information.  Binnie Kand, RN

## 2022-02-11 NOTE — Telephone Encounter (Signed)
Call received from patient, stating she signed a records release for [facility], but that the only records received by that organization were from the [facility] ED. Stated additional records required before she can receive services. Verified signed ROI on file from 01/10/22.  Called and spoke with caseworker Oletha Cruel [phone]), who stated that she was waiting for a follow up call from [facility] stated that she would discuss which records were needed with the patient's counselor, Vikki Ports, and would call back with additional information.  Binnie Kand, RN

## 2022-02-24 ENCOUNTER — Encounter
Payer: Medicare Other | Attending: Physical Medicine and Rehabilitation | Admitting: Physical Medicine and Rehabilitation

## 2022-02-24 ENCOUNTER — Encounter: Payer: Self-pay | Admitting: Physical Medicine and Rehabilitation

## 2022-02-24 VITALS — BP 139/92 | HR 54 | Ht 67.0 in | Wt 202.0 lb

## 2022-02-24 DIAGNOSIS — I69351 Hemiplegia and hemiparesis following cerebral infarction affecting right dominant side: Secondary | ICD-10-CM | POA: Insufficient documentation

## 2022-02-24 MED ORDER — ONABOTULINUMTOXINA 100 UNITS IJ SOLR
400.0000 [IU] | Freq: Once | INTRAMUSCULAR | Status: AC
  Administered 2022-02-24: 400 [IU] via INTRAMUSCULAR

## 2022-02-24 NOTE — Progress Notes (Signed)
Botox Injection for spasticity using needle ultrasound guidance  Indication: Severe spasticity which interferes with ADL,mobility and/or  hygiene and is unresponsive to medication management and other conservative care Informed consent was obtained after describing risks and benefits of the procedure with the patient. This includes bleeding, bruising, infection, excessive weakness, or medication side effects. A REMS form is on file and signed. Number of units per muscle FCR 100U 1 site FCU: 100U divided in 2 injection sites FDS 100 U divided in 2 injection sites FDP: 100U divided in 2 injection sites Biceps: 50U 1 site  All injections were done after obtaining appropriate EMG activity and after negative drawback for blood. The patient tolerated the procedure well. Post procedure instructions were given. A followup appointment was made.

## 2022-03-08 ENCOUNTER — Telehealth: Payer: Self-pay | Admitting: Physical Medicine and Rehabilitation

## 2022-03-08 NOTE — Telephone Encounter (Signed)
[facility] called requesting status of order for heating pad, patient states was discussed in visit and has not heard anything

## 2022-03-08 NOTE — Telephone Encounter (Signed)
Called patient back. No answer, no VM

## 2022-03-11 DIAGNOSIS — M25512 Pain in left shoulder: Secondary | ICD-10-CM | POA: Diagnosis not present

## 2022-03-11 DIAGNOSIS — M19012 Primary osteoarthritis, left shoulder: Secondary | ICD-10-CM | POA: Diagnosis not present

## 2022-03-17 ENCOUNTER — Telehealth: Payer: Self-pay

## 2022-03-17 NOTE — Telephone Encounter (Signed)
Patient called requesting trazodone prescription, encouraged her to reach out to PCP to discuss concerns and possible need for prescription.   Beryle Flock, RN

## 2022-03-22 ENCOUNTER — Telehealth: Payer: Self-pay | Admitting: Family

## 2022-03-22 NOTE — Telephone Encounter (Signed)
Please see below.

## 2022-03-22 NOTE — Telephone Encounter (Signed)
Patient need to have someone call her asap about her surgery date she is stating it is wrong. Tired to transfer to [place].Marland Kitchen

## 2022-03-23 NOTE — Telephone Encounter (Signed)
I spoke with [name].  She told me that she thought her surgery was scheduled for January 20th.  She has a confirmation letter, but states that she must have looked at it wrong.  I advised her that we could not accommodate the 20th because it is a Saturday but that we could reschedule surgery to Friday 04/01/22.  Surgery rescheduled and all new information given.

## 2022-03-27 ENCOUNTER — Encounter: Payer: Self-pay | Admitting: Infectious Disease

## 2022-03-27 DIAGNOSIS — E785 Hyperlipidemia, unspecified: Secondary | ICD-10-CM | POA: Insufficient documentation

## 2022-03-27 DIAGNOSIS — Z1211 Encounter for screening for malignant neoplasm of colon: Secondary | ICD-10-CM | POA: Insufficient documentation

## 2022-03-27 DIAGNOSIS — Z7185 Encounter for immunization safety counseling: Secondary | ICD-10-CM | POA: Insufficient documentation

## 2022-03-27 HISTORY — DX: Encounter for screening for malignant neoplasm of colon: Z12.11

## 2022-03-27 HISTORY — DX: Encounter for immunization safety counseling: Z71.85
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
## 2022-03-28 ENCOUNTER — Ambulatory Visit: Payer: Medicare Other | Admitting: Infectious Disease

## 2022-03-28 DIAGNOSIS — E785 Hyperlipidemia, unspecified: Secondary | ICD-10-CM

## 2022-03-28 DIAGNOSIS — I69351 Hemiplegia and hemiparesis following cerebral infarction affecting right dominant side: Secondary | ICD-10-CM

## 2022-03-28 DIAGNOSIS — Z7185 Encounter for immunization safety counseling: Secondary | ICD-10-CM

## 2022-03-28 DIAGNOSIS — I69851 Hemiplegia and hemiparesis following other cerebrovascular disease affecting right dominant side: Secondary | ICD-10-CM

## 2022-03-28 DIAGNOSIS — B20 Human immunodeficiency virus [HIV] disease: Secondary | ICD-10-CM

## 2022-03-28 DIAGNOSIS — Z79899 Other long term (current) drug therapy: Secondary | ICD-10-CM

## 2022-03-28 DIAGNOSIS — Z1211 Encounter for screening for malignant neoplasm of colon: Secondary | ICD-10-CM

## 2022-03-28 DIAGNOSIS — A812 Progressive multifocal leukoencephalopathy: Secondary | ICD-10-CM

## 2022-03-28 DIAGNOSIS — N926 Irregular menstruation, unspecified: Secondary | ICD-10-CM

## 2022-03-28 DIAGNOSIS — Z113 Encounter for screening for infections with a predominantly sexual mode of transmission: Secondary | ICD-10-CM

## 2022-03-28 DIAGNOSIS — F4310 Post-traumatic stress disorder, unspecified: Secondary | ICD-10-CM

## 2022-03-28 DIAGNOSIS — F419 Anxiety disorder, unspecified: Secondary | ICD-10-CM

## 2022-03-31 ENCOUNTER — Encounter (HOSPITAL_COMMUNITY): Payer: Self-pay | Admitting: Orthopedic Surgery

## 2022-03-31 ENCOUNTER — Other Ambulatory Visit: Payer: Self-pay

## 2022-03-31 NOTE — Progress Notes (Signed)
PCP - TOIZTI Nancy Fetter Infectious Disease: Asher Muir - denies  PPM/ICD - denies   Chest x-ray - n/a EKG - denies Stress Test - denies ECHO - denies Cardiac Cath - denies  CPAP - no   ERAS Protcol - clear liquids until 0700  COVID TEST- not needed  Anesthesia review: no  Ollen Gross (fiancee) [phone]  Patient verbally denies any shortness of breath, fever, cough and chest pain during phone call   -------------  SDW INSTRUCTIONS given:  Your procedure is scheduled on 04/01/22.  Report to [facility] Main Entrance "A" at 7:30 A.M., and check in at the Admitting office.  Call this number if you have problems the morning of surgery:  [phone]   Remember:  Do not eat after midnight the night before your surgery  You may drink clear liquids until 7:00am the morning of your surgery.   Clear liquids allowed are: Water, Non-Citrus Juices (without pulp), Carbonated Beverages, Clear Tea, Black Coffee Only, and Gatorade    Take these medicines the morning of surgery with A SIP OF WATER  Darunavir-Cobicistat-Emtricitabine-Tenofovir Alafenamide (SYMTUZA)   As of today, STOP taking any Aspirin (unless otherwise instructed by your surgeon) Aleve, Naproxen, Ibuprofen, Motrin, Advil, Goody's, BC's, all herbal medications, fish oil, meloxicam (MOBIC) and all vitamins.                      Do not wear jewelry, make up, or nail polish            Do not wear lotions, powders, perfumes/colognes, or deodorant.            Do not shave 48 hours prior to surgery.  Men may shave face and neck.            Do not bring valuables to the hospital.            [facility] is not responsible for any belongings or valuables.  Do NOT Smoke (Tobacco/Vaping) 24 hours prior to your procedure If you use a CPAP at night, you may bring all equipment for your overnight stay.   Contacts, glasses, dentures or bridgework may not be worn into surgery.      For patients admitted to the hospital, discharge  time will be determined by your treatment team.   Patients discharged the day of surgery will not be allowed to drive home, and someone needs to stay with them for 24 hours.    Special instructions:   [facility]- Preparing For Surgery  Before surgery, you can play an important role. Because skin is not sterile, your skin needs to be as free of germs as possible. You can reduce the number of germs on your skin by washing with CHG (chlorahexidine gluconate) Soap before surgery.  CHG is an antiseptic cleaner which kills germs and bonds with the skin to continue killing germs even after washing.    Oral Hygiene is also important to reduce your risk of infection.  Remember - BRUSH YOUR TEETH THE MORNING OF SURGERY WITH YOUR REGULAR TOOTHPASTE  Please do not use if you have an allergy to CHG or antibacterial soaps. If your skin becomes reddened/irritated stop using the CHG.  Do not shave (including legs and underarms) for at least 48 hours prior to first CHG shower. It is OK to shave your face.  Please follow these instructions carefully.   Shower the NIGHT BEFORE SURGERY and the MORNING OF SURGERY with DIAL Soap.   Pat yourself  dry with a CLEAN TOWEL.  Wear CLEAN PAJAMAS to bed the night before surgery  Place CLEAN SHEETS on your bed the night of your first shower and DO NOT SLEEP WITH PETS.   Day of Surgery: Please shower morning of surgery  Wear Clean/Comfortable clothing the morning of surgery Do not apply any deodorants/lotions.   Remember to brush your teeth WITH YOUR REGULAR TOOTHPASTE.   Questions were answered. Patient verbalized understanding of instructions.

## 2022-04-01 ENCOUNTER — Inpatient Hospital Stay (HOSPITAL_COMMUNITY)
Admission: RE | Admit: 2022-04-01 | Discharge: 2022-04-04 | DRG: 494 | Disposition: A | Discharge status: Home Health | Payer: 59 | Attending: Orthopedic Surgery | Admitting: Orthopedic Surgery

## 2022-04-01 ENCOUNTER — Encounter (HOSPITAL_COMMUNITY): Payer: Self-pay | Admitting: Orthopedic Surgery

## 2022-04-01 ENCOUNTER — Other Ambulatory Visit: Payer: Self-pay

## 2022-04-01 ENCOUNTER — Encounter (HOSPITAL_COMMUNITY)
Admission: RE | Disposition: A | Discharge status: Home Health | Payer: Self-pay | Source: Home / Self Care | Attending: Orthopedic Surgery

## 2022-04-01 ENCOUNTER — Ambulatory Visit (HOSPITAL_COMMUNITY): Payer: 59

## 2022-04-01 ENCOUNTER — Ambulatory Visit (HOSPITAL_COMMUNITY): Payer: 59 | Admitting: Certified Registered Nurse Anesthetist

## 2022-04-01 ENCOUNTER — Ambulatory Visit (HOSPITAL_BASED_OUTPATIENT_CLINIC_OR_DEPARTMENT_OTHER): Payer: 59 | Admitting: Certified Registered Nurse Anesthetist

## 2022-04-01 DIAGNOSIS — Z981 Arthrodesis status: Principal | ICD-10-CM

## 2022-04-01 DIAGNOSIS — M2031 Hallux varus (acquired), right foot: Secondary | ICD-10-CM

## 2022-04-01 DIAGNOSIS — A812 Progressive multifocal leukoencephalopathy: Secondary | ICD-10-CM | POA: Diagnosis present

## 2022-04-01 DIAGNOSIS — Z8673 Personal history of transient ischemic attack (TIA), and cerebral infarction without residual deficits: Secondary | ICD-10-CM | POA: Diagnosis not present

## 2022-04-01 DIAGNOSIS — M21371 Foot drop, right foot: Secondary | ICD-10-CM | POA: Diagnosis not present

## 2022-04-01 DIAGNOSIS — M24571 Contracture, right ankle: Principal | ICD-10-CM

## 2022-04-01 DIAGNOSIS — I69398 Other sequelae of cerebral infarction: Secondary | ICD-10-CM | POA: Diagnosis not present

## 2022-04-01 DIAGNOSIS — M25371 Other instability, right ankle: Secondary | ICD-10-CM

## 2022-04-01 DIAGNOSIS — Z885 Allergy status to narcotic agent status: Secondary | ICD-10-CM

## 2022-04-01 DIAGNOSIS — D573 Sickle-cell trait: Secondary | ICD-10-CM | POA: Diagnosis present

## 2022-04-01 DIAGNOSIS — F419 Anxiety disorder, unspecified: Secondary | ICD-10-CM

## 2022-04-01 DIAGNOSIS — G8918 Other acute postprocedural pain: Secondary | ICD-10-CM | POA: Diagnosis not present

## 2022-04-01 DIAGNOSIS — Z87891 Personal history of nicotine dependence: Secondary | ICD-10-CM | POA: Diagnosis not present

## 2022-04-01 DIAGNOSIS — B20 Human immunodeficiency virus [HIV] disease: Secondary | ICD-10-CM | POA: Diagnosis present

## 2022-04-01 DIAGNOSIS — I639 Cerebral infarction, unspecified: Secondary | ICD-10-CM | POA: Diagnosis not present

## 2022-04-01 HISTORY — DX: Sickle-cell trait: D57.3

## 2022-04-01 HISTORY — PX: ARTHRODESIS METATARSALPHALANGEAL JOINT (MTPJ): SHX6566

## 2022-04-01 HISTORY — PX: FOOT ARTHRODESIS: SHX1655

## 2022-04-01 LAB — CBC
HCT: 41.2 % (ref 36.0–46.0)
Hemoglobin: 14.8 g/dL (ref 12.0–15.0)
MCH: 28.9 pg (ref 26.0–34.0)
MCHC: 35.9 g/dL (ref 30.0–36.0)
MCV: 80.5 fL (ref 80.0–100.0)
Platelets: 257 10*3/uL (ref 150–400)
RBC: 5.12 MIL/uL — ABNORMAL HIGH (ref 3.87–5.11)
RDW: 13.3 % (ref 11.5–15.5)
WBC: 5.8 10*3/uL (ref 4.0–10.5)
nRBC: 0 % (ref 0.0–0.2)

## 2022-04-01 LAB — SURGICAL PCR SCREEN
MRSA, PCR: NEGATIVE
Staphylococcus aureus: NEGATIVE

## 2022-04-01 LAB — POCT PREGNANCY, URINE: Preg Test, Ur: NEGATIVE

## 2022-04-01 LAB — BASIC METABOLIC PANEL
Anion gap: 8 (ref 5–15)
BUN: 8 mg/dL (ref 6–20)
CO2: 22 mmol/L (ref 22–32)
Calcium: 8.9 mg/dL (ref 8.9–10.3)
Chloride: 107 mmol/L (ref 98–111)
Creatinine, Ser: 0.65 mg/dL (ref 0.44–1.00)
GFR, Estimated: >60 mL/min (ref >60)
Glucose, Bld: 106 mg/dL — ABNORMAL HIGH (ref 70–99)
Potassium: 3.7 mmol/L (ref 3.5–5.1)
Sodium: 137 mmol/L (ref 135–145)

## 2022-04-01 SURGERY — FUSION, JOINT, FOOT
Anesthesia: General | Site: Toe | Laterality: Right

## 2022-04-01 MED ORDER — POLYETHYLENE GLYCOL 3350 17 G PO PACK: 17.0000 g | PACK | Freq: Every day | ORAL | Status: DC | PRN

## 2022-04-01 MED ORDER — LIDOCAINE 2% (20 MG/ML) 5 ML SYRINGE
INTRAMUSCULAR | Status: AC
  Filled 2022-04-01: qty 5

## 2022-04-01 MED ORDER — BUPIVACAINE LIPOSOME 1.3 % IJ SUSP
INTRAMUSCULAR | Status: DC | PRN
  Administered 2022-04-01: 10 mL via PERINEURAL

## 2022-04-01 MED ORDER — DIPHENHYDRAMINE HCL 50 MG/ML IJ SOLN
INTRAMUSCULAR | Status: AC
  Filled 2022-04-01: qty 1

## 2022-04-01 MED ORDER — CEFAZOLIN SODIUM-DEXTROSE 2-4 GM/100ML-% IV SOLN
2.0000 g | INTRAVENOUS | Status: AC
  Administered 2022-04-01: 2 g via INTRAVENOUS

## 2022-04-01 MED ORDER — METHOCARBAMOL 500 MG PO TABS
500.0000 mg | ORAL_TABLET | Freq: Four times a day (QID) | ORAL | Status: DC | PRN
  Administered 2022-04-01 – 2022-04-04 (×6): 500 mg via ORAL
  Filled 2022-04-01 (×6): qty 1

## 2022-04-01 MED ORDER — HYDROMORPHONE HCL 1 MG/ML IJ SOLN
INTRAMUSCULAR | Status: AC
  Filled 2022-04-01: qty 1

## 2022-04-01 MED ORDER — OXYCODONE HCL 5 MG PO TABS
10.0000 mg | ORAL_TABLET | ORAL | Status: DC | PRN
  Administered 2022-04-01 – 2022-04-02 (×3): 10 mg via ORAL
  Administered 2022-04-02: 15 mg via ORAL
  Administered 2022-04-03: 10 mg via ORAL
  Administered 2022-04-03 (×2): 15 mg via ORAL
  Filled 2022-04-01 (×2): qty 3
  Filled 2022-04-01: qty 2
  Filled 2022-04-01 (×3): qty 3

## 2022-04-01 MED ORDER — DEXAMETHASONE SODIUM PHOSPHATE 10 MG/ML IJ SOLN
INTRAMUSCULAR | Status: DC | PRN
Start: 2022-04-01 — End: 2022-04-01
  Administered 2022-04-01: 5 mg via INTRAVENOUS

## 2022-04-01 MED ORDER — ORAL CARE MOUTH RINSE: 15.0000 mL | Freq: Once | OROMUCOSAL | Status: AC

## 2022-04-01 MED ORDER — METOCLOPRAMIDE HCL 5 MG/ML IJ SOLN: 5.0000 mg | Freq: Three times a day (TID) | INTRAMUSCULAR | Status: DC | PRN

## 2022-04-01 MED ORDER — LIDOCAINE 2% (20 MG/ML) 5 ML SYRINGE
INTRAMUSCULAR | Status: DC | PRN
  Administered 2022-04-01: 60 mg via INTRAVENOUS

## 2022-04-01 MED ORDER — METOCLOPRAMIDE HCL 5 MG PO TABS: 5.0000 mg | ORAL_TABLET | Freq: Three times a day (TID) | ORAL | Status: DC | PRN

## 2022-04-01 MED ORDER — ONDANSETRON HCL 4 MG/2ML IJ SOLN
INTRAMUSCULAR | Status: AC
  Filled 2022-04-01: qty 2

## 2022-04-01 MED ORDER — PROPOFOL 10 MG/ML IV BOLUS
INTRAVENOUS | Status: AC
  Filled 2022-04-01: qty 20

## 2022-04-01 MED ORDER — ONDANSETRON HCL 4 MG/2ML IJ SOLN: 4.0000 mg | Freq: Four times a day (QID) | INTRAMUSCULAR | Status: DC | PRN

## 2022-04-01 MED ORDER — SODIUM CHLORIDE 0.9 % IV SOLN: INTRAVENOUS | Status: DC

## 2022-04-01 MED ORDER — ROCURONIUM BROMIDE 100 MG/10ML IV SOLN
INTRAVENOUS | Status: DC | PRN
  Administered 2022-04-01: 70 mg via INTRAVENOUS

## 2022-04-01 MED ORDER — ONDANSETRON HCL 4 MG PO TABS: 4.0000 mg | ORAL_TABLET | Freq: Four times a day (QID) | ORAL | Status: DC | PRN

## 2022-04-01 MED ORDER — ONDANSETRON HCL 4 MG/2ML IJ SOLN: 4.0000 mg | Freq: Once | INTRAMUSCULAR | Status: DC | PRN

## 2022-04-01 MED ORDER — BISACODYL 10 MG RE SUPP: 10.0000 mg | Freq: Every day | RECTAL | Status: DC | PRN

## 2022-04-01 MED ORDER — METHOCARBAMOL 500 MG PO TABS
ORAL_TABLET | ORAL | Status: AC
  Filled 2022-04-01: qty 1

## 2022-04-01 MED ORDER — PROMETHAZINE HCL 25 MG/ML IJ SOLN
INTRAMUSCULAR | Status: AC
  Filled 2022-04-01: qty 1

## 2022-04-01 MED ORDER — SUGAMMADEX SODIUM 200 MG/2ML IV SOLN
INTRAVENOUS | Status: DC | PRN
  Administered 2022-04-01: 200 mg via INTRAVENOUS

## 2022-04-01 MED ORDER — DARUN-COBIC-EMTRICIT-TENOFAF 800-150-200-10 MG PO TABS
1.0000 | ORAL_TABLET | Freq: Every day | ORAL | Status: DC
  Administered 2022-04-02 – 2022-04-04 (×3): 1 via ORAL
  Filled 2022-04-01 (×3): qty 1

## 2022-04-01 MED ORDER — MIDAZOLAM HCL 2 MG/2ML IJ SOLN
INTRAMUSCULAR | Status: AC
  Administered 2022-04-01: 1 mg via INTRAVENOUS
  Filled 2022-04-01: qty 2

## 2022-04-01 MED ORDER — CEFAZOLIN SODIUM-DEXTROSE 1-4 GM/50ML-% IV SOLN
1.0000 g | Freq: Four times a day (QID) | INTRAVENOUS | Status: AC
  Administered 2022-04-01 – 2022-04-02 (×3): 1 g via INTRAVENOUS
  Filled 2022-04-01 (×3): qty 50

## 2022-04-01 MED ORDER — DOCUSATE SODIUM 100 MG PO CAPS
100.0000 mg | ORAL_CAPSULE | Freq: Two times a day (BID) | ORAL | Status: DC
  Administered 2022-04-01 – 2022-04-04 (×6): 100 mg via ORAL
  Filled 2022-04-01 (×7): qty 1

## 2022-04-01 MED ORDER — METHOCARBAMOL 1000 MG/10ML IJ SOLN: 500.0000 mg | Freq: Four times a day (QID) | INTRAVENOUS | Status: DC | PRN

## 2022-04-01 MED ORDER — LABETALOL HCL 200 MG PO TABS
100.0000 mg | ORAL_TABLET | Freq: Once | ORAL | Status: AC
  Administered 2022-04-01: 100 mg via ORAL
  Filled 2022-04-01: qty 1

## 2022-04-01 MED ORDER — PROMETHAZINE HCL 25 MG/ML IJ SOLN
6.2500 mg | INTRAMUSCULAR | Status: DC | PRN
  Administered 2022-04-01: 12.5 mg via INTRAVENOUS

## 2022-04-01 MED ORDER — FENTANYL CITRATE (PF) 100 MCG/2ML IJ SOLN: 100.0000 ug | Freq: Once | INTRAMUSCULAR | Status: AC

## 2022-04-01 MED ORDER — ONDANSETRON HCL 4 MG/2ML IJ SOLN
INTRAMUSCULAR | Status: DC | PRN
  Administered 2022-04-01: 4 mg via INTRAVENOUS

## 2022-04-01 MED ORDER — BUPIVACAINE HCL (PF) 0.5 % IJ SOLN
INTRAMUSCULAR | Status: DC | PRN
  Administered 2022-04-01 (×2): 20 mL via PERINEURAL

## 2022-04-01 MED ORDER — PROPOFOL 10 MG/ML IV BOLUS
INTRAVENOUS | Status: DC | PRN
  Administered 2022-04-01: 100 mg via INTRAVENOUS

## 2022-04-01 MED ORDER — HYDROMORPHONE HCL 1 MG/ML IJ SOLN
0.5000 mg | INTRAMUSCULAR | Status: DC | PRN
  Administered 2022-04-01: 1 mg via INTRAVENOUS
  Filled 2022-04-01: qty 1

## 2022-04-01 MED ORDER — MIDAZOLAM HCL 2 MG/2ML IJ SOLN: 1.0000 mg | Freq: Once | INTRAMUSCULAR | Status: AC

## 2022-04-01 MED ORDER — CHLORHEXIDINE GLUCONATE 0.12 % MT SOLN
15.0000 mL | Freq: Once | OROMUCOSAL | Status: AC
  Administered 2022-04-01: 15 mL via OROMUCOSAL
  Filled 2022-04-01: qty 15

## 2022-04-01 MED ORDER — MIDAZOLAM HCL 2 MG/2ML IJ SOLN
INTRAMUSCULAR | Status: AC
  Filled 2022-04-01: qty 2

## 2022-04-01 MED ORDER — MAGNESIUM CITRATE PO SOLN: 1.0000 | Freq: Once | ORAL | Status: DC | PRN

## 2022-04-01 MED ORDER — FENTANYL CITRATE (PF) 250 MCG/5ML IJ SOLN
INTRAMUSCULAR | Status: DC | PRN
  Administered 2022-04-01 (×5): 50 ug via INTRAVENOUS

## 2022-04-01 MED ORDER — PHENYLEPHRINE 80 MCG/ML (10ML) SYRINGE FOR IV PUSH (FOR BLOOD PRESSURE SUPPORT)
PREFILLED_SYRINGE | INTRAVENOUS | Status: DC | PRN
  Administered 2022-04-01: 80 ug via INTRAVENOUS

## 2022-04-01 MED ORDER — ASPIRIN 325 MG PO TBEC
325.0000 mg | DELAYED_RELEASE_TABLET | Freq: Every day | ORAL | Status: DC
  Administered 2022-04-01 – 2022-04-04 (×4): 325 mg via ORAL
  Filled 2022-04-01 (×4): qty 1

## 2022-04-01 MED ORDER — FENTANYL CITRATE (PF) 250 MCG/5ML IJ SOLN
INTRAMUSCULAR | Status: AC
  Filled 2022-04-01: qty 5

## 2022-04-01 MED ORDER — HYDROMORPHONE HCL 1 MG/ML IJ SOLN
0.2500 mg | INTRAMUSCULAR | Status: DC | PRN
  Administered 2022-04-01 (×2): 0.5 mg via INTRAVENOUS

## 2022-04-01 MED ORDER — DIPHENHYDRAMINE HCL 50 MG/ML IJ SOLN
25.0000 mg | Freq: Once | INTRAMUSCULAR | Status: AC
  Administered 2022-04-01: 25 mg via INTRAVENOUS

## 2022-04-01 MED ORDER — FENTANYL CITRATE (PF) 100 MCG/2ML IJ SOLN
INTRAMUSCULAR | Status: AC
  Administered 2022-04-01: 100 ug via INTRAVENOUS
  Filled 2022-04-01: qty 2

## 2022-04-01 MED ORDER — OXYCODONE HCL 5 MG PO TABS: 5.0000 mg | ORAL_TABLET | Freq: Once | ORAL | Status: DC | PRN

## 2022-04-01 MED ORDER — OXYCODONE HCL 5 MG/5ML PO SOLN: 5.0000 mg | Freq: Once | ORAL | Status: DC | PRN

## 2022-04-01 MED ORDER — 0.9 % SODIUM CHLORIDE (POUR BTL) OPTIME
TOPICAL | Status: DC | PRN
  Administered 2022-04-01: 1000 mL

## 2022-04-01 MED ORDER — CEFAZOLIN SODIUM-DEXTROSE 2-4 GM/100ML-% IV SOLN
INTRAVENOUS | Status: AC
  Filled 2022-04-01: qty 100

## 2022-04-01 MED ORDER — OXYCODONE HCL 5 MG PO TABS
5.0000 mg | ORAL_TABLET | ORAL | Status: DC | PRN
  Administered 2022-04-02: 10 mg via ORAL
  Filled 2022-04-01 (×2): qty 2

## 2022-04-01 MED ORDER — ACETAMINOPHEN 325 MG PO TABS: 325.0000 mg | ORAL_TABLET | Freq: Four times a day (QID) | ORAL | Status: DC | PRN

## 2022-04-01 MED ORDER — LACTATED RINGERS IV SOLN: INTRAVENOUS | Status: DC

## 2022-04-01 SURGICAL SUPPLY — 64 items
BAG COUNTER SPONGE SURGICOUNT (BAG) ×2 IMPLANT
BANDAGE ESMARK 6X9 LF (GAUZE/BANDAGES/DRESSINGS) IMPLANT
BIT DRILL 2.0X130 SLD AO (BIT) IMPLANT
BIT DRILL CANNULATED 4.6 (BIT) IMPLANT
BIT DRILL LONG 3.1X160 (DRILL) IMPLANT
BIT DRILL SOLID LONG 2.8X160 (DRILL) IMPLANT
BLADE SAW SGTL HD 18.5X60.5X1. (BLADE) ×2 IMPLANT
BLADE SURG 10 STRL SS (BLADE) IMPLANT
BNDG COHESIVE 4X5 TAN STRL (GAUZE/BANDAGES/DRESSINGS) ×2 IMPLANT
BNDG COHESIVE 4X5 TAN STRL LF (GAUZE/BANDAGES/DRESSINGS) IMPLANT
BNDG ESMARK 6X9 LF (GAUZE/BANDAGES/DRESSINGS)
BNDG GAUZE DERMACEA FLUFF 4 (GAUZE/BANDAGES/DRESSINGS) ×4 IMPLANT
COTTON STERILE ROLL (GAUZE/BANDAGES/DRESSINGS) ×2 IMPLANT
COVER MAYO STAND STRL (DRAPES) IMPLANT
COVER SURGICAL LIGHT HANDLE (MISCELLANEOUS) ×4 IMPLANT
DRAPE INCISE IOBAN 66X45 STRL (DRAPES) ×2 IMPLANT
DRAPE OEC MINIVIEW 54X84 (DRAPES) IMPLANT
DRAPE U-SHAPE 47X51 STRL (DRAPES) ×2 IMPLANT
DRILL LONG 3.1X160 (DRILL) ×2
DRILL SOLID LONG 2.8X160 (DRILL) ×2
DRSG ADAPTIC 3X8 NADH LF (GAUZE/BANDAGES/DRESSINGS) ×2 IMPLANT
DRSG CURAD 3X16 NADH (PACKING) IMPLANT
DURAPREP 26ML APPLICATOR (WOUND CARE) ×2 IMPLANT
ELECT REM PT RETURN 9FT ADLT (ELECTROSURGICAL) ×2
ELECTRODE REM PT RTRN 9FT ADLT (ELECTROSURGICAL) ×2 IMPLANT
GAUZE PAD ABD 8X10 STRL (GAUZE/BANDAGES/DRESSINGS) IMPLANT
GAUZE SPONGE 4X4 12PLY STRL (GAUZE/BANDAGES/DRESSINGS) ×2 IMPLANT
GLOVE BIOGEL PI IND STRL 9 (GLOVE) ×2 IMPLANT
GLOVE SURG ORTHO 9.0 STRL STRW (GLOVE) ×2 IMPLANT
GOWN STRL REUS W/ TWL XL LVL3 (GOWN DISPOSABLE) ×6 IMPLANT
GOWN STRL REUS W/TWL XL LVL3 (GOWN DISPOSABLE) ×6
K-WIRE SINGLE TROCAR 2.3X230 (WIRE) ×2
K-WIRE SMOOTH 1.6X150MM (WIRE) ×2
KIT BASIN OR (CUSTOM PROCEDURE TRAY) ×2 IMPLANT
KIT TURNOVER KIT B (KITS) ×2 IMPLANT
KWIRE SINGLE TROCAR 2.3X230 (WIRE) IMPLANT
KWIRE SMOOTH 1.6X150MM (WIRE) IMPLANT
MANIFOLD NEPTUNE II (INSTRUMENTS) ×2 IMPLANT
MTP PLATE SMALL R 5 DEGREE (Plate) IMPLANT
NS IRRIG 1000ML POUR BTL (IV SOLUTION) ×2 IMPLANT
PACK ORTHO EXTREMITY (CUSTOM PROCEDURE TRAY) ×2 IMPLANT
PAD ARMBOARD 7.5X6 YLW CONV (MISCELLANEOUS) ×4 IMPLANT
PAD CAST 4YDX4 CTTN HI CHSV (CAST SUPPLIES) ×2 IMPLANT
PADDING CAST COTTON 4X4 STRL (CAST SUPPLIES)
PLATE ANT TTC CONT RT (Plate) IMPLANT
SCREW CANN THRD HDLS 7X48 (Screw) IMPLANT
SCREW LOCK PLATE R3 2.7X12 (Screw) IMPLANT
SCREW LOCK PLATE R3 2.7X16 (Screw) IMPLANT
SCREW LOCK PLATE R3 2.7X18 (Screw) IMPLANT
SCREW LOCK PLATE R3 4.2X24 (Screw) IMPLANT
SCREW LOCK PLATE R3 4.2X28 (Screw) ×4 IMPLANT
SCREW LOCK PLATE SB 4.5X28 (Screw) IMPLANT
SCREW LOCK PLT 28X4.2XNS GRLL (Screw) IMPLANT
SCREW NLOCK PLATE SB 4.5X26 (Screw) IMPLANT
SCREW NON LOCKING 2.7X18 (Screw) IMPLANT
SCREW NONLOCK 4.5X28 (Screw) IMPLANT
SPONGE T-LAP 18X18 ~~LOC~~+RFID (SPONGE) ×2 IMPLANT
SUCTION FRAZIER HANDLE 10FR (MISCELLANEOUS) ×2
SUCTION TUBE FRAZIER 10FR DISP (MISCELLANEOUS) ×2 IMPLANT
SUT ETHILON 2 0 PSLX (SUTURE) ×6 IMPLANT
TOWEL GREEN STERILE (TOWEL DISPOSABLE) ×2 IMPLANT
TOWEL GREEN STERILE FF (TOWEL DISPOSABLE) ×2 IMPLANT
TUBE CONNECTING 12X1/4 (SUCTIONS) ×2 IMPLANT
WATER STERILE IRR 1000ML POUR (IV SOLUTION) ×2 IMPLANT
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
## 2022-04-01 NOTE — Anesthesia Procedure Notes (Signed)
Procedure Name: LMA Insertion Date/Time: 04/01/2022 3:00 PM  Performed by: Terrence Dupont, CRNAPre-anesthesia Checklist: Patient identified, Emergency Drugs available, Suction available and Patient being monitored Patient Re-evaluated:Patient Re-evaluated prior to induction Oxygen Delivery Method: Circle system utilized Preoxygenation: Pre-oxygenation with 100% oxygen Induction Type: IV induction Ventilation: Mask ventilation without difficulty LMA: LMA inserted LMA Size: 4.0 Tube type: Oral Number of attempts: 1 Airway Equipment and Method: Stylet and Oral airway Placement Confirmation: ETT inserted through vocal cords under direct vision, positive ETCO2 and breath sounds checked- equal and bilateral Tube secured with: Tape Dental Injury: Teeth and Oropharynx as per pre-operative assessment

## 2022-04-01 NOTE — Op Note (Signed)
04/01/2022  11:42 AM  PATIENT:  Paula Martinez    PRE-OPERATIVE DIAGNOSIS:  Stroke, Ankle Instability, Hallux Varus Right Foot  POST-OPERATIVE DIAGNOSIS:  Same  PROCEDURE:  RIGHT TIBIOCALCANEAL FUSION, RIGHT GREAT TOE METATARSOPHALANGEAL FUSION  SURGEON:  [name], MD  PHYSICIAN ASSISTANT:None ANESTHESIA:   General  PREOPERATIVE INDICATIONS:  [name] is a  52 y.o. female with a diagnosis of Stroke, Ankle Instability, Hallux Varus Right Foot who failed conservative measures and elected for surgical management.    The risks benefits and alternatives were discussed with the patient preoperatively including but not limited to the risks of infection, bleeding, nerve injury, cardiopulmonary complications, the need for revision surgery, among others, and the patient was willing to proceed.  OPERATIVE IMPLANTS: Anterior tibial talar fusion plate and dorsal MTP fusion plate 5 degrees dorsiflexion  '@ENCIMAGES'$ @  OPERATIVE FINDINGS: Fluoroscopy verified reduction and fusion of the tibiotalar joint and the great toe MTP joint.  OPERATIVE PROCEDURE: Patient was brought the operating room and underwent general anesthetic.  After adequate levels anesthesia obtained the right lower extremity was prepped using DuraPrep draped into a sterile field a timeout was called.  An anterior incision was made over the ankle this was carried down through the interval between the anterior tibial tendon and the EHL tendon.  This was carried down to the joint and retractors were placed to protect the neurovascular bundles and tendons.  An oscillating saw was used to resect the bone from the tibial talar joint.  The joint was reduced and slight valgus.  Neutral dorsiflexion.  The anterior plate was applied with 2 compression screws into the talus and compression and locking screws into the tibia.  See arthroscopy verified reduction.  The wound was irrigated normal saline incision closed using 2-0 nylon.  A  medial incision was made over the great toe MTP joint.  This was carried down to the joint retractors were placed and using the 19 mm cup reamer the metatarsal head was reamed and the 19 mm cone reamer was used to debride the base of the proximal phalanx.  The joint was reduced the toe alignment was corrected and the dorsal plate was applied and locked proximally and distally with 3 screws proximally and 3 screws distally.  The wound was irrigated with normal saline incision closed using 2-0 nylon a sterile dressing was applied patient was extubated taken the PACU in stable condition.   DISCHARGE PLANNING:  Antibiotic duration: 24-hour antibiotics  Weightbearing: Nonweightbearing on the right  Pain medication: Opioid pathway  Dressing care/ Wound VAC: Dry dressing  Ambulatory devices: Crutches or walker  Discharge to: Anticipate discharge to home after overnight observation.  Follow-up: In the office 1 week post operative.

## 2022-04-01 NOTE — Interval H&P Note (Signed)
History and Physical Interval Note:  04/01/2022 9:58 AM  Paula Martinez  has presented today for surgery, with the diagnosis of Stroke, Ankle Instability, Hallux Varus Right Foot.  The various methods of treatment have been discussed with the patient and family. After consideration of risks, benefits and other options for treatment, the patient has consented to  Procedure(s): RIGHT TIBIOCALCANEAL FUSION (Right) RIGHT GREAT TOE METATARSOPHALANGEAL FUSION (Right) as a surgical intervention.  The patient's history has been reviewed, patient examined, no change in status, stable for surgery.  I have reviewed the patient's chart and labs.  Questions were answered to the patient's satisfaction.     [name]

## 2022-04-01 NOTE — Transfer of Care (Signed)
Immediate Anesthesia Transfer of Care Note  Patient: Paula Martinez  Procedure(s) Performed: RIGHT TIBIOCALCANEAL FUSION (Right: Foot) RIGHT GREAT TOE METATARSOPHALANGEAL FUSION (Right: Toe)  Patient Location: PACU  Anesthesia Type:GA combined with regional for post-op pain  Level of Consciousness: awake  Airway & Oxygen Therapy: Patient Spontanous Breathing  Post-op Assessment: Report given to RN and Post -op Vital signs reviewed and stable  Post vital signs: Reviewed  Last Vitals:  Vitals Value Taken Time  BP 139/92 04/01/22 1142  Temp    Pulse 57 04/01/22 1143  Resp 12 04/01/22 1143  SpO2 98 % 04/01/22 1143  Vitals shown include unvalidated device data.  Last Pain:  Vitals:   04/01/22 0945  TempSrc:   PainSc: 0-No pain         Complications: No notable events documented.

## 2022-04-01 NOTE — Anesthesia Postprocedure Evaluation (Signed)
Anesthesia Post Note  Patient: Paula Martinez  Procedure(s) Performed: RIGHT TIBIOCALCANEAL FUSION (Right: Foot) RIGHT GREAT TOE METATARSOPHALANGEAL FUSION (Right: Toe)     Patient location during evaluation: PACU Anesthesia Type: General Level of consciousness: awake and alert Pain management: pain level controlled Vital Signs Assessment: post-procedure vital signs reviewed and stable Respiratory status: spontaneous breathing, nonlabored ventilation and respiratory function stable Cardiovascular status: blood pressure returned to baseline and stable Anesthetic complications: no   No notable events documented.  Last Vitals:  Vitals:   04/01/22 0945 04/01/22 1145  BP: (!) 143/102 (!) 139/92  Pulse: 69 67  Resp: 15 16  Temp:  36.5 C  SpO2: 97% 93%    Last Pain:  Vitals:   04/01/22 1145  TempSrc:   PainSc: 8                  Eleshia Wooley A.

## 2022-04-01 NOTE — Progress Notes (Signed)
Orthopedic Tech Progress Note Patient Details:  Paula Martinez Jan 21, 1971 [id number]  Ortho Devices Type of Ortho Device: CAM walker Ortho Device/Splint Location: RLE Ortho Device/Splint Interventions: Ordered     CAM walker left at bedside with PACU RN. Vernona Rieger 04/01/2022, 1:52 PM

## 2022-04-01 NOTE — Anesthesia Procedure Notes (Addendum)
Anesthesia Regional Block: Adductor canal block   Pre-Anesthetic Checklist: , timeout performed,  Correct Patient, Correct Site, Correct Laterality,  Correct Procedure, Correct Position, site marked,  Risks and benefits discussed,  Surgical consent,  Pre-op evaluation,  At surgeon's request and post-op pain management  Laterality: Right  Prep: chloraprep       Needles:  Injection technique: Single-shot  Needle Type: Echogenic Stimulator Needle     Needle Length: 10cm  Needle Gauge: 21   Needle insertion depth: 7 cm   Additional Needles:   Procedures:,,,, ultrasound used (permanent image in chart),,    Narrative:  Start time: 04/01/2022 9:40 AM End time: 04/01/2022 9:45 AM Injection made incrementally with aspirations every 5 mL.  Performed by: Personally  Anesthesiologist: Josephine Igo, MD  Additional Notes: Timeout performed. Patient sedated. Relevant anatomy ID'd using [place]. Incremental 2-5m injection of LA with frequent aspiration. Patient tolerated procedure well.

## 2022-04-01 NOTE — Anesthesia Preprocedure Evaluation (Addendum)
Anesthesia Evaluation  Patient identified by MRN, date of birth, ID band Patient awake    Reviewed: Allergy & Precautions, NPO status , Patient's Chart, lab work & pertinent test results  Airway Mallampati: II  TM Distance: >3 FB Neck ROM: Full    Dental  (+) Partial Upper, Poor Dentition, Dental Advisory Given   Pulmonary Patient abstained from smoking., former smoker   Pulmonary exam normal breath sounds clear to auscultation       Cardiovascular negative cardio ROS Normal cardiovascular exam Rhythm:Regular Rate:Normal     Neuro/Psych  PSYCHIATRIC DISORDERS Anxiety     Right hemiplegia PML CVA, Residual Symptoms    GI/Hepatic negative GI ROS,,,(+)     substance abuse  cocaine use, Hepatitis -, C  Endo/Other  Obesity   Renal/GU negative Renal ROS  negative genitourinary   Musculoskeletal Right ankle instability Hallux varus right foot   Abdominal  (+) + obese  Peds  Hematology  (+) Blood dyscrasia, Sickle cell trait , HIV  Anesthesia Other Findings   Reproductive/Obstetrics                              Anesthesia Physical Anesthesia Plan  ASA: 3  Anesthesia Plan: General   Post-op Pain Management: Regional block* and Minimal or no pain anticipated   Induction: Intravenous  PONV Risk Score and Plan: 4 or greater and Treatment may vary due to age or medical condition, Ondansetron and Dexamethasone  Airway Management Planned: LMA  Additional Equipment: None  Intra-op Plan:   Post-operative Plan: Extubation in OR  Informed Consent: I have reviewed the patients History and Physical, chart, labs and discussed the procedure including the risks, benefits and alternatives for the proposed anesthesia with the patient or authorized representative who has indicated his/her understanding and acceptance.     Dental advisory given  Plan Discussed with: CRNA and  Anesthesiologist  Anesthesia Plan Comments:          Anesthesia Quick Evaluation

## 2022-04-01 NOTE — Anesthesia Procedure Notes (Signed)
Anesthesia Regional Block: Popliteal block   Pre-Anesthetic Checklist: , timeout performed,  Correct Patient, Correct Site, Correct Laterality,  Correct Procedure, Correct Position, site marked,  Risks and benefits discussed,  Surgical consent,  Pre-op evaluation,  At surgeon's request and post-op pain management  Laterality: Right  Prep: chloraprep       Needles:  Injection technique: Single-shot  Needle Type: Echogenic Stimulator Needle     Needle Length: 10cm  Needle Gauge: 21   Needle insertion depth: 6 cm   Additional Needles:   Procedures:,,,, ultrasound used (permanent image in chart),,    Narrative:  Start time: 04/01/2022 9:35 AM End time: 04/01/2022 9:40 AM Injection made incrementally with aspirations every 5 mL.  Performed by: Personally  Anesthesiologist: Josephine Igo, MD  Additional Notes: Timeout performed. Patient sedated. Relevant anatomy ID'd using [place]. Incremental 2-34m injection of LA with frequent aspiration. Patient tolerated procedure well.

## 2022-04-02 MED ORDER — METHOCARBAMOL 500 MG PO TABS: 500.0000 mg | ORAL_TABLET | Freq: Four times a day (QID) | ORAL | Status: DC | PRN

## 2022-04-02 NOTE — Progress Notes (Signed)
Per pt request IV removed from left wrist.

## 2022-04-02 NOTE — Progress Notes (Signed)
Mobility Specialist Progress Note:   04/02/22 1332  Mobility  Activity Transferred from chair to bed  Level of Assistance Standby assist, set-up cues, supervision of patient - no hands on  Assistive Device None  RLE Weight Bearing NWB  Activity Response Tolerated well  $Mobility charge 1 Mobility   Pt received in chair and requesting assistance back to bed. C/o pain in RLE throughout ambulation. Pt returned to bed with all needs met, call bell in reach, and bed alarm on.   Andrey Campanile Mobility Specialist Please contact via SecureChat or  Rehab office at [phone]

## 2022-04-02 NOTE — Progress Notes (Signed)
Patient ID: Paula Martinez, female   DOB: [birth year], 52 y.o.   MRN: [id number] Patient is postoperative day 1 right ankle fusion and right great toe MTP fusion.  She states she has generalized aches and pains including her right upper extremity.  I will write orders for Robaxin.  Her foot ankle and toe are straight with a small spot of blood drainage at the great toe.  Patient will progress with physical therapy touchdown weightbearing on the right with a fracture boot on and plan for discharge to home on Monday.

## 2022-04-02 NOTE — Plan of Care (Signed)
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
## 2022-04-03 NOTE — Progress Notes (Signed)
Patient ID: Paula Martinez, female   DOB: 11/03/1970, 52 y.o.   MRN: [id number] Patient is postoperative day 2 right ankle fusion and right great toe MTP fusion.  States that she is continuing to have right lower extremity spasms although this is improving somewhat with Robaxin.  Small drainage on her Kerlix dressing although this is stable.  She is working with physical therapy and was able to mobilize to the chair.  Remain touchdown weightbearing on the right leg and continue to mobilize with physical therapy in hopes of discharge Monday with home health PT.

## 2022-04-03 NOTE — Progress Notes (Addendum)
Top layer of dressing removed  from the right foot wound/procedural surrounding site cleaned and new dressing applied.
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
## 2022-04-04 ENCOUNTER — Telehealth: Payer: Self-pay | Admitting: Orthopedic Surgery

## 2022-04-04 MED ORDER — OXYCODONE-ACETAMINOPHEN 5-325 MG PO TABS: 1.0000 | ORAL_TABLET | ORAL | 0 refills | Status: DC | PRN

## 2022-04-04 MED ORDER — METHOCARBAMOL 500 MG PO TABS: 500.0000 mg | ORAL_TABLET | Freq: Four times a day (QID) | ORAL | 0 refills | Status: DC | PRN

## 2022-04-04 NOTE — Progress Notes (Signed)
Patient ID: Paula Martinez, female   DOB: 1971/03/08, 52 y.o.   MRN: [id number] Patient making progress with therapy.  Plan for discharge today with a prescription for Percocet for pain and Robaxin for her muscle spasms.  Discussed the importance of wearing the fracture boot at all times when transferring or gait training.  Patient does not have to wear the boot if she is in bed chair and not ambulating.

## 2022-04-04 NOTE — Telephone Encounter (Signed)
Patient states she was told to reschedule her appt for 1 week follow up not 2 weeks, per Dr. Sharol Given. I was attempting to change appt although no appointments open till the 30th. Which she already has. Please call patient and advise. I was going to put her with Junie Panning but patient states Sharol Given only.

## 2022-04-04 NOTE — Telephone Encounter (Signed)
Patient called asked if she can get a note for her employer stating she was out of work under her doctor's care 04/01/2022 thru 04/08/2022. [employer] is where the letter need to emailed to:  The email address is  [email or web address] and the department as well. The  (HR Department)  email is  [email or web address]     The number to contact patient is [phone]

## 2022-04-04 NOTE — Telephone Encounter (Signed)
Tried to call patient. No answer. LMOM for patient to call us back for scheduling. Dr.Duda said that it would be fine for her to be seen on the 30th since he is on vacation the week before.
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
## 2022-04-04 NOTE — Progress Notes (Signed)
Patient is alert and oriented. Sitting at the recliner eating breakfast. Reported manageable pain. Right foot dressing clean, dry and intact. Pt verbalized no bowel for few days. Offered suppository and refused. Discharge paperwork reviewed by CN.

## 2022-04-04 NOTE — Progress Notes (Signed)
CSW spoke with Paula Martinez of [facility] who states the agency can accept patient for [facility] services. Alvis Lemmings will contact the patient directly to initiate services.  Madilyn Fireman, MSW, LCSW Transitions of Care  Clinical Social Worker II [phone]

## 2022-04-04 NOTE — Care Management Important Message (Signed)
Important Message  Patient Details  Name: Paula Martinez MRN: [id number] Date of Birth: 08-04-1970   Medicare Important Message Given:  Yes     [name] 04/04/2022, 2:54 PM

## 2022-04-04 NOTE — Discharge Summary (Signed)
Discharge Diagnoses:  Principal Problem:   S/P ankle fusion Active Problems:   Hallux varus, acquired, right   Ankle contracture, right   Surgeries: Procedure(s): RIGHT TIBIOCALCANEAL FUSION RIGHT GREAT TOE METATARSOPHALANGEAL FUSION on 04/01/2022    Consultants:   Discharged Condition: Improved  Hospital Course: Paula Martinez is an 52 y.o. female who was admitted 04/01/2022 with a chief complaint of equinus contracture right ankle and hallux varus, with a final diagnosis of Stroke, Ankle Instability, Hallux Varus Right Foot.  Patient was brought to the operating room on 04/01/2022 and underwent Procedure(s): RIGHT TIBIOCALCANEAL FUSION RIGHT GREAT TOE METATARSOPHALANGEAL FUSION.    Patient was given perioperative antibiotics:  Anti-infectives (From admission, onward)    Start     Dose/Rate Route Frequency Ordered Stop   04/02/22 0800  Darunavir-Cobicistat-Emtricitabine-Tenofovir Alafenamide (SYMTUZA) 800-150-200-10 MG TABS 1 tablet        1 tablet Oral Daily with breakfast 04/01/22 1231     04/01/22 1600  ceFAZolin (ANCEF) IVPB 1 g/50 mL premix        1 g 100 mL/hr over 30 Minutes Intravenous Every 6 hours 04/01/22 1231 04/02/22 0410   04/01/22 0745  ceFAZolin (ANCEF) IVPB 2g/100 mL premix        2 g 200 mL/hr over 30 Minutes Intravenous On call to O.R. 04/01/22 0733 04/01/22 1009   04/01/22 0736  ceFAZolin (ANCEF) 2-4 GM/100ML-% IVPB       Note to Pharmacy: Demetrios Isaacs A: cabinet override      04/01/22 0736 04/01/22 1024     .  Patient was given sequential compression devices, early ambulation, and aspirin for DVT prophylaxis.  Recent vital signs: Patient Vitals for the past 24 hrs:  BP Temp Temp src Pulse Resp SpO2  04/04/22 0346 107/64 98.6 F (37 C) -- 84 16 93 %  04/03/22 2008 126/88 -- -- 96 16 92 %  04/03/22 1533 (!) 146/99 98.5 F (36.9 C) Oral 93 16 99 %  04/03/22 0758 127/82 99.3 F (37.4 C) Oral 93 17 98 %  .  Recent laboratory studies: No results  found.  Discharge Medications:   Allergies as of 04/04/2022       Reactions   Morphine Other (See Comments)   Hallucinations    Methadone Nausea And Vomiting        Medication List     STOP taking these medications    cyclobenzaprine 5 MG tablet Commonly known as: FLEXERIL       TAKE these medications    lidocaine 5 % Commonly known as: LIDODERM APPLY ONE PATCH onto THE SKIN daily. REMOVE & discard WITHIN 12 HOURS AS DIRECTED by AND AT NOON What changed: See the new instructions.   meloxicam 15 MG tablet Commonly known as: MOBIC Take 15 mg by mouth daily.   methocarbamol 500 MG tablet Commonly known as: ROBAXIN Take 1 tablet (500 mg total) by mouth every 6 (six) hours as needed for muscle spasms.   multivitamin with minerals Tabs tablet Take 1 tablet by mouth every 14 (fourteen) days.   oxyCODONE-acetaminophen 5-325 MG tablet Commonly known as: PERCOCET/ROXICET Take 1 tablet by mouth every 4 (four) hours as needed.   Symtuza 800-150-200-10 MG Tabs Generic drug: Darunavir-Cobicistat-Emtricitabine-Tenofovir Alafenamide Take 1 tablet by mouth daily with breakfast.   triamcinolone ointment 0.1 % Commonly known as: KENALOG Apply 1 Application topically 2 (two) times daily as needed (dry skin).               Discharge Care  Instructions  (From admission, onward)           Start     Ordered   04/04/22 0000  Touch down weight bearing       Comments: Patient must have the fracture boot on for touchdown weightbearing  Question Answer Comment  Laterality right   Extremity Lower      04/04/22 0735            Diagnostic Studies: DG MINI C-ARM IMAGE ONLY  Result Date: 04/01/2022 There is no interpretation for this exam.  This order is for images obtained during a surgical procedure.  Please See "Surgeries" Tab for more information regarding the procedure.    Patient benefited maximally from their hospital stay and there were no complications.      Disposition: Discharge disposition: 01-Home or Self Care      Discharge Instructions     Call MD / Call 911   Complete by: As directed    If you experience chest pain or shortness of breath, CALL 911 and be transported to the hospital emergency room.  If you develope a fever above 101 F, pus (white drainage) or increased drainage or redness at the wound, or calf pain, call your surgeon's office.   Constipation Prevention   Complete by: As directed    Drink plenty of fluids.  Prune juice may be helpful.  You may use a stool softener, such as Colace (over the counter) 100 mg twice a day.  Use MiraLax (over the counter) for constipation as needed.   Diet - low sodium heart healthy   Complete by: As directed    Increase activity slowly as tolerated   Complete by: As directed    Post-operative opioid taper instructions:   Complete by: As directed    POST-OPERATIVE OPIOID TAPER INSTRUCTIONS: It is important to wean off of your opioid medication as soon as possible. If you do not need pain medication after your surgery it is ok to stop day one. Opioids include: Codeine, Hydrocodone(Norco, Vicodin), Oxycodone(Percocet, oxycontin) and hydromorphone amongst others.  Long term and even short term use of opiods can cause: Increased pain response Dependence Constipation Depression Respiratory depression And more.  Withdrawal symptoms can include Flu like symptoms Nausea, vomiting And more Techniques to manage these symptoms Hydrate well Eat regular healthy meals Stay active Use relaxation techniques(deep breathing, meditating, yoga) Do Not substitute Alcohol to help with tapering If you have been on opioids for less than two weeks and do not have pain than it is ok to stop all together.  Plan to wean off of opioids This plan should start within one week post op of your joint replacement. Maintain the same interval or time between taking each dose and first decrease the dose.   Cut the total daily intake of opioids by one tablet each day Next start to increase the time between doses. The last dose that should be eliminated is the evening dose.      Touch down weight bearing   Complete by: As directed    Patient must have the fracture boot on for touchdown weightbearing   Laterality: right   Extremity: Lower       Follow-up Information     [name], MD Follow up in 1 week(s).   Specialty: Orthopedic Surgery Contact information: [address] [place] [address] [phone]                  Signed: [name]  04/04/2022, 7:35 AM

## 2022-04-06 ENCOUNTER — Telehealth: Payer: Self-pay | Admitting: Orthopedic Surgery

## 2022-04-06 ENCOUNTER — Other Ambulatory Visit: Payer: Self-pay

## 2022-04-06 NOTE — Telephone Encounter (Signed)
Faxed to the number provided by patient ATT: [name] conformation received that fax went through.

## 2022-04-06 NOTE — Telephone Encounter (Signed)
Pt called to giver fax number to her job. Please put Attenion [name]. Fax number is [phone].

## 2022-04-06 NOTE — Telephone Encounter (Signed)
I called pt to advise that the note had been sent as requested however a mail delivery error occurred and would not go through. I checked the email given and advised the pt the preferred method of communication is to fax. I asked that she call and get a fax number and I will be happy to send it wherever it is needed.

## 2022-04-08 ENCOUNTER — Encounter: Payer: Medicare Other | Admitting: Family

## 2022-04-11 ENCOUNTER — Encounter: Payer: Medicare Other | Admitting: Orthopedic Surgery

## 2022-04-11 ENCOUNTER — Encounter (HOSPITAL_COMMUNITY): Payer: Self-pay | Admitting: Orthopedic Surgery

## 2022-04-12 ENCOUNTER — Ambulatory Visit (INDEPENDENT_AMBULATORY_CARE_PROVIDER_SITE_OTHER): Payer: 59 | Admitting: Orthopedic Surgery

## 2022-04-12 ENCOUNTER — Telehealth: Payer: Self-pay | Admitting: Orthopedic Surgery

## 2022-04-12 ENCOUNTER — Ambulatory Visit (INDEPENDENT_AMBULATORY_CARE_PROVIDER_SITE_OTHER): Payer: 59

## 2022-04-12 DIAGNOSIS — M21371 Foot drop, right foot: Secondary | ICD-10-CM

## 2022-04-12 DIAGNOSIS — Z981 Arthrodesis status: Secondary | ICD-10-CM

## 2022-04-12 DIAGNOSIS — M24571 Contracture, right ankle: Secondary | ICD-10-CM

## 2022-04-12 DIAGNOSIS — M2031 Hallux varus (acquired), right foot: Secondary | ICD-10-CM

## 2022-04-12 MED ORDER — OXYCODONE-ACETAMINOPHEN 5-325 MG PO TABS: 1.0000 | ORAL_TABLET | ORAL | 0 refills | Status: DC | PRN

## 2022-04-12 MED ORDER — METHOCARBAMOL 500 MG PO TABS: 500.0000 mg | ORAL_TABLET | Freq: Four times a day (QID) | ORAL | 0 refills | Status: DC | PRN

## 2022-04-12 NOTE — Telephone Encounter (Signed)
I called and sw pt and she just wanted to make sure that she was elevating her foot high enough. Reassurance provided on keeping foot chest  level and using pillows to support not just the foot but her knee as well. Pt voiced understanding and will call with any questions.

## 2022-04-12 NOTE — Telephone Encounter (Signed)
Pt called requesting a call back from Autumn F. She states she has a question about her home care. Please call pt at [phone].

## 2022-04-14 ENCOUNTER — Encounter: Payer: Self-pay | Admitting: Orthopedic Surgery
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
## 2022-04-19 ENCOUNTER — Ambulatory Visit (INDEPENDENT_AMBULATORY_CARE_PROVIDER_SITE_OTHER): Payer: 59 | Admitting: Orthopedic Surgery

## 2022-04-19 DIAGNOSIS — Z981 Arthrodesis status: Secondary | ICD-10-CM | POA: Diagnosis not present

## 2022-04-26 ENCOUNTER — Ambulatory Visit (INDEPENDENT_AMBULATORY_CARE_PROVIDER_SITE_OTHER): Payer: 59

## 2022-04-26 ENCOUNTER — Ambulatory Visit (INDEPENDENT_AMBULATORY_CARE_PROVIDER_SITE_OTHER): Payer: 59 | Admitting: Orthopedic Surgery

## 2022-04-26 ENCOUNTER — Encounter: Payer: Self-pay | Admitting: Orthopedic Surgery

## 2022-04-26 ENCOUNTER — Telehealth: Payer: Self-pay | Admitting: Orthopedic Surgery

## 2022-04-26 DIAGNOSIS — M25571 Pain in right ankle and joints of right foot: Secondary | ICD-10-CM

## 2022-04-26 DIAGNOSIS — Z981 Arthrodesis status: Secondary | ICD-10-CM

## 2022-04-26 DIAGNOSIS — M79671 Pain in right foot: Secondary | ICD-10-CM

## 2022-04-26 DIAGNOSIS — M2031 Hallux varus (acquired), right foot: Secondary | ICD-10-CM

## 2022-04-26 DIAGNOSIS — M21371 Foot drop, right foot: Secondary | ICD-10-CM

## 2022-04-26 MED ORDER — OXYCODONE-ACETAMINOPHEN 5-325 MG PO TABS: 1.0000 | ORAL_TABLET | ORAL | 0 refills | Status: DC | PRN

## 2022-04-26 NOTE — Telephone Encounter (Signed)
Patient called needing Rx refilled Oxycodone. The number to contact patient is [phone]

## 2022-04-26 NOTE — Telephone Encounter (Signed)
Pt in for appt today f/u on ankle fusion. Oxycodone last filled 04/12/22 #30
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
## 2022-04-29 ENCOUNTER — Encounter: Payer: Self-pay | Admitting: Orthopedic Surgery
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
## 2022-05-02 ENCOUNTER — Ambulatory Visit (INDEPENDENT_AMBULATORY_CARE_PROVIDER_SITE_OTHER): Payer: 59

## 2022-05-02 ENCOUNTER — Ambulatory Visit (INDEPENDENT_AMBULATORY_CARE_PROVIDER_SITE_OTHER): Payer: 59 | Admitting: Orthopedic Surgery

## 2022-05-02 ENCOUNTER — Other Ambulatory Visit: Payer: Self-pay

## 2022-05-02 DIAGNOSIS — Z981 Arthrodesis status: Secondary | ICD-10-CM

## 2022-05-02 DIAGNOSIS — M25512 Pain in left shoulder: Secondary | ICD-10-CM

## 2022-05-03 ENCOUNTER — Encounter: Payer: Self-pay | Admitting: Orthopedic Surgery

## 2022-05-03 ENCOUNTER — Other Ambulatory Visit: Payer: Self-pay | Admitting: Orthopedic Surgery

## 2022-05-03 ENCOUNTER — Telehealth: Payer: Self-pay | Admitting: Orthopedic Surgery

## 2022-05-03 MED ORDER — METHOCARBAMOL 500 MG PO TABS: 500.0000 mg | ORAL_TABLET | Freq: Four times a day (QID) | ORAL | 0 refills | Status: DC | PRN
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
## 2022-05-03 NOTE — Telephone Encounter (Signed)
Pt in the office yesterday s/p right tib-cal fusion 04/01/2022. Requesting refill on robaxin last refill was 04/12/2022 #30 please advise.

## 2022-05-03 NOTE — Telephone Encounter (Signed)
Patient would like a muscle relaxer called into pharmacy. Best contact number [phone]

## 2022-05-03 NOTE — Telephone Encounter (Signed)
I called and lm on vm to advise that rx has been sent to pharmacy. To call with questions

## 2022-05-12 ENCOUNTER — Ambulatory Visit: Payer: 59 | Admitting: Orthopedic Surgery

## 2022-05-18 ENCOUNTER — Ambulatory Visit: Payer: Self-pay

## 2022-05-18 ENCOUNTER — Ambulatory Visit (INDEPENDENT_AMBULATORY_CARE_PROVIDER_SITE_OTHER): Payer: 59 | Admitting: Family

## 2022-05-18 ENCOUNTER — Encounter: Payer: Self-pay | Admitting: Family

## 2022-05-18 DIAGNOSIS — M2031 Hallux varus (acquired), right foot: Secondary | ICD-10-CM

## 2022-05-18 DIAGNOSIS — Z981 Arthrodesis status: Secondary | ICD-10-CM
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
## 2022-05-19 ENCOUNTER — Ambulatory Visit: Payer: 59 | Admitting: Orthopedic Surgery

## 2022-05-19 ENCOUNTER — Ambulatory Visit: Payer: 59
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
## 2022-05-23 ENCOUNTER — Ambulatory Visit: Payer: 59 | Attending: Orthopedic Surgery | Admitting: Physical Therapy

## 2022-05-23 DIAGNOSIS — R29818 Other symptoms and signs involving the nervous system: Secondary | ICD-10-CM

## 2022-05-23 DIAGNOSIS — M25512 Pain in left shoulder: Secondary | ICD-10-CM

## 2022-05-23 DIAGNOSIS — M6281 Muscle weakness (generalized): Secondary | ICD-10-CM | POA: Diagnosis not present

## 2022-05-23 DIAGNOSIS — R252 Cramp and spasm: Secondary | ICD-10-CM | POA: Diagnosis not present

## 2022-05-23 DIAGNOSIS — R2689 Other abnormalities of gait and mobility: Secondary | ICD-10-CM

## 2022-05-26 ENCOUNTER — Encounter: Payer: 59 | Attending: Physical Medicine and Rehabilitation | Admitting: Physical Medicine and Rehabilitation

## 2022-05-26 VITALS — BP 130/89 | HR 79

## 2022-05-26 DIAGNOSIS — I69351 Hemiplegia and hemiparesis following cerebral infarction affecting right dominant side: Secondary | ICD-10-CM | POA: Insufficient documentation

## 2022-05-26 DIAGNOSIS — M24574 Contracture, right foot: Secondary | ICD-10-CM | POA: Diagnosis not present

## 2022-05-26 DIAGNOSIS — M24531 Contracture, right wrist: Secondary | ICD-10-CM | POA: Diagnosis not present

## 2022-05-26 DIAGNOSIS — M24571 Contracture, right ankle: Secondary | ICD-10-CM | POA: Diagnosis not present

## 2022-05-26 MED ORDER — SODIUM CHLORIDE (PF) 0.9 % IJ SOLN
4.0000 mL | Freq: Once | INTRAMUSCULAR | Status: AC
  Administered 2022-05-26: 4 mL via INTRAVENOUS

## 2022-05-26 MED ORDER — ONABOTULINUMTOXINA 100 UNITS IJ SOLR
400.0000 [IU] | Freq: Once | INTRAMUSCULAR | Status: AC
  Administered 2022-05-26: 400 [IU] via INTRAMUSCULAR

## 2022-05-27 NOTE — Progress Notes (Signed)
Botox Injection for spasticity using needle ultrasound guidance  Indication: Severe spasticity which interferes with ADL,mobility and/or  hygiene and is unresponsive to medication management and other conservative care Informed consent was obtained after describing risks and benefits of the procedure with the patient. This includes bleeding, bruising, infection, excessive weakness, or medication side effects. A REMS form is on file and signed. Number of units per muscle FCR 100U 1 site FCU: 100U divided in 2 injection sites FDS 100 U divided in 2 injection sites FDP: 100U divided in 2 injection sites Biceps: 50U 1 site  All injections were done after obtaining appropriate EMG activity and after negative drawback for blood. The patient tolerated the procedure well. Post procedure instructions were given. A followup appointment was made.    Prescribed Zynex Nexwave and heating/cooling blanket

## 2022-05-31 ENCOUNTER — Ambulatory Visit (INDEPENDENT_AMBULATORY_CARE_PROVIDER_SITE_OTHER): Payer: 59 | Admitting: Orthopedic Surgery

## 2022-05-31 DIAGNOSIS — M2031 Hallux varus (acquired), right foot: Secondary | ICD-10-CM

## 2022-05-31 DIAGNOSIS — Z981 Arthrodesis status: Secondary | ICD-10-CM

## 2022-05-31 DIAGNOSIS — M21371 Foot drop, right foot: Secondary | ICD-10-CM

## 2022-06-05 ENCOUNTER — Encounter: Payer: Self-pay | Admitting: Orthopedic Surgery
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
## 2022-06-06 ENCOUNTER — Encounter: Payer: Self-pay | Admitting: Infectious Disease

## 2022-06-06 ENCOUNTER — Ambulatory Visit (INDEPENDENT_AMBULATORY_CARE_PROVIDER_SITE_OTHER): Payer: 59

## 2022-06-06 ENCOUNTER — Ambulatory Visit (INDEPENDENT_AMBULATORY_CARE_PROVIDER_SITE_OTHER): Payer: 59 | Admitting: Infectious Disease

## 2022-06-06 ENCOUNTER — Other Ambulatory Visit: Payer: Self-pay

## 2022-06-06 VITALS — BP 139/90 | HR 85 | Temp 97.3°F | Wt 200.0 lb

## 2022-06-06 DIAGNOSIS — A812 Progressive multifocal leukoencephalopathy: Secondary | ICD-10-CM

## 2022-06-06 DIAGNOSIS — Z79899 Other long term (current) drug therapy: Secondary | ICD-10-CM | POA: Diagnosis not present

## 2022-06-06 DIAGNOSIS — Z113 Encounter for screening for infections with a predominantly sexual mode of transmission: Secondary | ICD-10-CM

## 2022-06-06 DIAGNOSIS — I69851 Hemiplegia and hemiparesis following other cerebrovascular disease affecting right dominant side: Secondary | ICD-10-CM

## 2022-06-06 DIAGNOSIS — B20 Human immunodeficiency virus [HIV] disease: Secondary | ICD-10-CM

## 2022-06-06 DIAGNOSIS — M24571 Contracture, right ankle: Secondary | ICD-10-CM

## 2022-06-06 DIAGNOSIS — Z23 Encounter for immunization: Secondary | ICD-10-CM

## 2022-06-06 DIAGNOSIS — Z7185 Encounter for immunization safety counseling: Secondary | ICD-10-CM | POA: Diagnosis not present

## 2022-06-06 DIAGNOSIS — F4310 Post-traumatic stress disorder, unspecified: Secondary | ICD-10-CM

## 2022-06-06 DIAGNOSIS — E785 Hyperlipidemia, unspecified: Secondary | ICD-10-CM

## 2022-06-06 DIAGNOSIS — N926 Irregular menstruation, unspecified: Secondary | ICD-10-CM

## 2022-06-06 DIAGNOSIS — F419 Anxiety disorder, unspecified: Secondary | ICD-10-CM

## 2022-06-06 DIAGNOSIS — Z981 Arthrodesis status: Secondary | ICD-10-CM

## 2022-06-06 MED ORDER — SYMTUZA 800-150-200-10 MG PO TABS: 1.0000 | ORAL_TABLET | Freq: Every day | ORAL | 11 refills | Status: DC

## 2022-06-06 MED ORDER — ATORVASTATIN CALCIUM 10 MG PO TABS: 10.0000 mg | ORAL_TABLET | Freq: Every day | ORAL | 11 refills | Status: DC
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
## 2022-06-06 NOTE — Addendum Note (Signed)
Addended by: Adelfa Koh on: 06/06/2022 03:32 PM   Modules accepted: Orders

## 2022-06-06 NOTE — Addendum Note (Signed)
Addended by: Caffie Pinto on: 06/06/2022 03:21 PM   Modules accepted: Orders

## 2022-06-07 ENCOUNTER — Encounter: Payer: Self-pay | Admitting: Physical Therapy

## 2022-06-07 ENCOUNTER — Ambulatory Visit: Payer: 59 | Admitting: Physical Therapy

## 2022-06-07 DIAGNOSIS — R2689 Other abnormalities of gait and mobility: Secondary | ICD-10-CM

## 2022-06-07 DIAGNOSIS — M25512 Pain in left shoulder: Secondary | ICD-10-CM | POA: Diagnosis not present

## 2022-06-07 DIAGNOSIS — R29818 Other symptoms and signs involving the nervous system: Secondary | ICD-10-CM | POA: Diagnosis not present

## 2022-06-07 DIAGNOSIS — M6281 Muscle weakness (generalized): Secondary | ICD-10-CM | POA: Diagnosis not present

## 2022-06-07 DIAGNOSIS — R252 Cramp and spasm: Secondary | ICD-10-CM | POA: Diagnosis not present

## 2022-06-07 LAB — T-HELPER CELLS (CD4) COUNT (NOT AT ARMC)
CD4 % Helper T Cell: 36 % (ref 33–65)
CD4 T Cell Abs: 864 /uL (ref 400–1790)
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
## 2022-06-09 LAB — COMPLETE METABOLIC PANEL WITH GFR
AG Ratio: 1.3 (calc) (ref 1.0–2.5)
ALT: 12 U/L (ref 6–29)
AST: 12 U/L (ref 10–35)
Albumin: 4.2 g/dL (ref 3.6–5.1)
Alkaline phosphatase (APISO): 104 U/L (ref 37–153)
BUN: 11 mg/dL (ref 7–25)
CO2: 23 mmol/L (ref 20–32)
Calcium: 9.5 mg/dL (ref 8.6–10.4)
Chloride: 109 mmol/L (ref 98–110)
Creat: 0.71 mg/dL (ref 0.50–1.03)
Globulin: 3.3 g/dL (calc) (ref 1.9–3.7)
Glucose, Bld: 108 mg/dL — ABNORMAL HIGH (ref 65–99)
Potassium: 3.4 mmol/L — ABNORMAL LOW (ref 3.5–5.3)
Sodium: 142 mmol/L (ref 135–146)
Total Bilirubin: 0.3 mg/dL (ref 0.2–1.2)
Total Protein: 7.5 g/dL (ref 6.1–8.1)
eGFR: 103 mL/min/{1.73_m2} (ref >=60)

## 2022-06-09 LAB — CBC WITH DIFFERENTIAL/PLATELET
Absolute Monocytes: 354 cells/uL (ref 200–950)
Basophils Absolute: 49 cells/uL (ref 0–200)
Basophils Relative: 0.8 %
Eosinophils Absolute: 140 cells/uL (ref 15–500)
Eosinophils Relative: 2.3 %
HCT: 43.1 % (ref 35.0–45.0)
Hemoglobin: 14.3 g/dL (ref 11.7–15.5)
Lymphs Abs: 2708 cells/uL (ref 850–3900)
MCH: 27.7 pg (ref 27.0–33.0)
MCHC: 33.2 g/dL (ref 32.0–36.0)
MCV: 83.4 fL (ref 80.0–100.0)
MPV: 12.4 fL (ref 7.5–12.5)
Monocytes Relative: 5.8 %
Neutro Abs: 2849 cells/uL (ref 1500–7800)
Neutrophils Relative %: 46.7 %
Platelets: 247 10*3/uL (ref 140–400)
RBC: 5.17 10*6/uL — ABNORMAL HIGH (ref 3.80–5.10)
RDW: 13.5 % (ref 11.0–15.0)
Total Lymphocyte: 44.4 %
WBC: 6.1 10*3/uL (ref 3.8–10.8)

## 2022-06-09 LAB — LIPID PANEL
Cholesterol: 184 mg/dL (ref <200)
HDL: 58 mg/dL (ref >=50)
LDL Cholesterol (Calc): 96 mg/dL (calc)
Non-HDL Cholesterol (Calc): 126 mg/dL (calc) (ref <130)
Total CHOL/HDL Ratio: 3.2 (calc) (ref <5.0)
Triglycerides: 201 mg/dL — ABNORMAL HIGH (ref <150)

## 2022-06-09 LAB — HIV-1 RNA QUANT-NO REFLEX-BLD
HIV 1 RNA Quant: NOT DETECTED Copies/mL
HIV-1 RNA Quant, Log: NOT DETECTED Log cps/mL

## 2022-06-09 LAB — RPR: RPR Ser Ql: NONREACTIVE

## 2022-06-13 DIAGNOSIS — I69398 Other sequelae of cerebral infarction: Secondary | ICD-10-CM | POA: Diagnosis not present

## 2022-06-13 DIAGNOSIS — G8928 Other chronic postprocedural pain: Secondary | ICD-10-CM | POA: Diagnosis not present

## 2022-06-14 ENCOUNTER — Ambulatory Visit: Payer: 59 | Attending: Orthopedic Surgery

## 2022-06-14 DIAGNOSIS — M25512 Pain in left shoulder: Secondary | ICD-10-CM | POA: Insufficient documentation

## 2022-06-14 DIAGNOSIS — R2689 Other abnormalities of gait and mobility: Secondary | ICD-10-CM | POA: Insufficient documentation

## 2022-06-14 DIAGNOSIS — R29818 Other symptoms and signs involving the nervous system: Secondary | ICD-10-CM | POA: Diagnosis not present

## 2022-06-14 DIAGNOSIS — M6281 Muscle weakness (generalized): Secondary | ICD-10-CM | POA: Diagnosis not present

## 2022-06-14 DIAGNOSIS — R252 Cramp and spasm: Secondary | ICD-10-CM | POA: Diagnosis not present
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
## 2022-06-16 ENCOUNTER — Ambulatory Visit: Payer: 59

## 2022-06-17 ENCOUNTER — Telehealth: Payer: Self-pay

## 2022-06-17 NOTE — Telephone Encounter (Signed)
Spoke with pt re: no show appt on 06/16/22. Advised re: attendance policy and of upcoming appt.
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
## 2022-06-21 ENCOUNTER — Ambulatory Visit: Payer: 59

## 2022-06-21 DIAGNOSIS — R29818 Other symptoms and signs involving the nervous system: Secondary | ICD-10-CM | POA: Diagnosis not present

## 2022-06-21 DIAGNOSIS — M6281 Muscle weakness (generalized): Secondary | ICD-10-CM | POA: Diagnosis not present

## 2022-06-21 DIAGNOSIS — R252 Cramp and spasm: Secondary | ICD-10-CM

## 2022-06-21 DIAGNOSIS — R2689 Other abnormalities of gait and mobility: Secondary | ICD-10-CM | POA: Diagnosis not present

## 2022-06-21 DIAGNOSIS — M25512 Pain in left shoulder: Secondary | ICD-10-CM | POA: Diagnosis not present

## 2022-06-23 ENCOUNTER — Encounter: Payer: Self-pay | Admitting: Physical Therapy

## 2022-06-23 ENCOUNTER — Ambulatory Visit: Payer: 59 | Admitting: Physical Therapy

## 2022-06-23 DIAGNOSIS — M25512 Pain in left shoulder: Secondary | ICD-10-CM

## 2022-06-23 DIAGNOSIS — M6281 Muscle weakness (generalized): Secondary | ICD-10-CM

## 2022-06-23 DIAGNOSIS — R252 Cramp and spasm: Secondary | ICD-10-CM | POA: Diagnosis not present

## 2022-06-23 DIAGNOSIS — R2689 Other abnormalities of gait and mobility: Secondary | ICD-10-CM | POA: Diagnosis not present

## 2022-06-23 DIAGNOSIS — R29818 Other symptoms and signs involving the nervous system: Secondary | ICD-10-CM | POA: Diagnosis not present
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
## 2022-06-28 ENCOUNTER — Other Ambulatory Visit (INDEPENDENT_AMBULATORY_CARE_PROVIDER_SITE_OTHER): Payer: 59

## 2022-06-28 ENCOUNTER — Ambulatory Visit: Payer: 59

## 2022-06-28 ENCOUNTER — Ambulatory Visit (INDEPENDENT_AMBULATORY_CARE_PROVIDER_SITE_OTHER): Payer: 59 | Admitting: Orthopedic Surgery

## 2022-06-28 DIAGNOSIS — M2031 Hallux varus (acquired), right foot: Secondary | ICD-10-CM

## 2022-06-28 DIAGNOSIS — Z981 Arthrodesis status: Secondary | ICD-10-CM

## 2022-06-29 ENCOUNTER — Encounter (HOSPITAL_COMMUNITY): Payer: Self-pay

## 2022-06-29 ENCOUNTER — Ambulatory Visit (HOSPITAL_COMMUNITY)
Admission: EM | Admit: 2022-06-29 | Discharge: 2022-06-29 | Disposition: A | Discharge status: Home / Self Care | Payer: 59 | Attending: Internal Medicine | Admitting: Internal Medicine

## 2022-06-29 DIAGNOSIS — K047 Periapical abscess without sinus: Secondary | ICD-10-CM

## 2022-06-29 MED ORDER — AMOXICILLIN-POT CLAVULANATE 875-125 MG PO TABS: 1.0000 | ORAL_TABLET | Freq: Two times a day (BID) | ORAL | 0 refills | Status: DC

## 2022-06-29 MED ORDER — LIDOCAINE HCL (PF) 2 % IJ SOLN
INTRAMUSCULAR | Status: AC
  Filled 2022-06-29: qty 5

## 2022-06-29 MED ORDER — FLUCONAZOLE 150 MG PO TABS: 150.0000 mg | ORAL_TABLET | ORAL | 0 refills | Status: DC

## 2022-06-29 NOTE — Discharge Instructions (Signed)
Your dental pain is likely due to dental infection. Take Augmentin antibiotic as prescribed for the next 7 days to treat your dental infection. Continue use of ibuprofen 800 mg every 8 hours as needed with food for dental inflammation and pain.  You may also take 1000 mg of Tylenol every 6 hours as needed with this for breakthrough pain. Perform salt water gargles every 3-4 hours.  With your dentist appointment as scheduled  You may take Diflucan 1 pill today, then again in 7 days to treat prophylactically for vaginal yeast infection related to antibiotic use.  If you develop any new or worsening symptoms or do not improve in the next 2 to 3 days, please return.  If your symptoms are severe, please go to the emergency room.  Follow-up with your primary care provider for further evaluation and management of your symptoms as well as ongoing wellness visits.  I hope you feel better!
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
## 2022-06-29 NOTE — ED Triage Notes (Signed)
Pt presents with c/o r side mouth pain, abscess. Pt states she has not eaten in 4 days.   States she has taken ibuprofen.

## 2022-07-01 ENCOUNTER — Ambulatory Visit: Payer: 59

## 2022-07-03 ENCOUNTER — Encounter: Payer: Self-pay | Admitting: Orthopedic Surgery
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
## 2022-07-13 DIAGNOSIS — G8928 Other chronic postprocedural pain: Secondary | ICD-10-CM | POA: Diagnosis not present

## 2022-07-13 DIAGNOSIS — I69398 Other sequelae of cerebral infarction: Secondary | ICD-10-CM | POA: Diagnosis not present

## 2022-07-14 ENCOUNTER — Ambulatory Visit: Payer: 59 | Attending: Orthopedic Surgery

## 2022-07-14 DIAGNOSIS — R252 Cramp and spasm: Secondary | ICD-10-CM | POA: Diagnosis not present

## 2022-07-14 DIAGNOSIS — R29818 Other symptoms and signs involving the nervous system: Secondary | ICD-10-CM

## 2022-07-14 DIAGNOSIS — M6281 Muscle weakness (generalized): Secondary | ICD-10-CM | POA: Insufficient documentation

## 2022-07-14 DIAGNOSIS — M25512 Pain in left shoulder: Secondary | ICD-10-CM | POA: Insufficient documentation
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
## 2022-07-26 ENCOUNTER — Other Ambulatory Visit (INDEPENDENT_AMBULATORY_CARE_PROVIDER_SITE_OTHER): Payer: 59

## 2022-07-26 ENCOUNTER — Ambulatory Visit (INDEPENDENT_AMBULATORY_CARE_PROVIDER_SITE_OTHER): Payer: 59 | Admitting: Orthopedic Surgery

## 2022-07-26 DIAGNOSIS — Z981 Arthrodesis status: Secondary | ICD-10-CM

## 2022-07-26 DIAGNOSIS — M21371 Foot drop, right foot: Secondary | ICD-10-CM | POA: Diagnosis not present

## 2022-07-26 DIAGNOSIS — Q7021 Fused toes, right foot: Secondary | ICD-10-CM

## 2022-07-26 DIAGNOSIS — M21331 Wrist drop, right wrist: Secondary | ICD-10-CM

## 2022-07-26 DIAGNOSIS — M2031 Hallux varus (acquired), right foot: Secondary | ICD-10-CM | POA: Diagnosis not present

## 2022-07-28 ENCOUNTER — Encounter: Payer: 59 | Admitting: Orthopedic Surgery
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
## 2022-08-01 ENCOUNTER — Ambulatory Visit: Payer: 59 | Admitting: Physical Therapy

## 2022-08-01 ENCOUNTER — Encounter: Payer: Self-pay | Admitting: Physical Therapy

## 2022-08-01 DIAGNOSIS — M25512 Pain in left shoulder: Secondary | ICD-10-CM

## 2022-08-01 DIAGNOSIS — M6281 Muscle weakness (generalized): Secondary | ICD-10-CM | POA: Diagnosis not present

## 2022-08-01 DIAGNOSIS — R29818 Other symptoms and signs involving the nervous system: Secondary | ICD-10-CM | POA: Diagnosis not present

## 2022-08-01 DIAGNOSIS — R252 Cramp and spasm: Secondary | ICD-10-CM | POA: Diagnosis not present

## 2022-08-04 ENCOUNTER — Encounter: Payer: Self-pay | Admitting: Orthopedic Surgery
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
## 2022-08-10 ENCOUNTER — Ambulatory Visit: Payer: 59 | Admitting: Physical Therapy

## 2022-08-10 ENCOUNTER — Encounter: Payer: Self-pay | Admitting: Physical Therapy

## 2022-08-10 DIAGNOSIS — R252 Cramp and spasm: Secondary | ICD-10-CM | POA: Diagnosis not present

## 2022-08-10 DIAGNOSIS — M25512 Pain in left shoulder: Secondary | ICD-10-CM | POA: Diagnosis not present

## 2022-08-10 DIAGNOSIS — R29818 Other symptoms and signs involving the nervous system: Secondary | ICD-10-CM | POA: Diagnosis not present

## 2022-08-10 DIAGNOSIS — M6281 Muscle weakness (generalized): Secondary | ICD-10-CM
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
## 2022-08-13 DIAGNOSIS — G8928 Other chronic postprocedural pain: Secondary | ICD-10-CM | POA: Diagnosis not present

## 2022-08-13 DIAGNOSIS — I69398 Other sequelae of cerebral infarction: Secondary | ICD-10-CM | POA: Diagnosis not present

## 2022-08-15 ENCOUNTER — Encounter: Payer: Self-pay | Admitting: Physical Therapy

## 2022-08-15 ENCOUNTER — Ambulatory Visit: Payer: 59 | Attending: Orthopedic Surgery | Admitting: Physical Therapy

## 2022-08-15 DIAGNOSIS — M6281 Muscle weakness (generalized): Secondary | ICD-10-CM | POA: Insufficient documentation

## 2022-08-15 DIAGNOSIS — R262 Difficulty in walking, not elsewhere classified: Secondary | ICD-10-CM | POA: Insufficient documentation

## 2022-08-15 DIAGNOSIS — I69351 Hemiplegia and hemiparesis following cerebral infarction affecting right dominant side: Secondary | ICD-10-CM | POA: Diagnosis not present

## 2022-08-15 DIAGNOSIS — Z981 Arthrodesis status: Secondary | ICD-10-CM | POA: Diagnosis not present
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
## 2022-08-22 ENCOUNTER — Ambulatory Visit: Payer: 59 | Admitting: Physical Therapy

## 2022-08-22 ENCOUNTER — Encounter: Payer: Self-pay | Admitting: Physical Therapy

## 2022-08-22 DIAGNOSIS — R262 Difficulty in walking, not elsewhere classified: Secondary | ICD-10-CM

## 2022-08-22 DIAGNOSIS — Z981 Arthrodesis status: Secondary | ICD-10-CM

## 2022-08-22 DIAGNOSIS — M6281 Muscle weakness (generalized): Secondary | ICD-10-CM | POA: Diagnosis not present

## 2022-08-22 DIAGNOSIS — I69351 Hemiplegia and hemiparesis following cerebral infarction affecting right dominant side: Secondary | ICD-10-CM
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
## 2022-08-29 ENCOUNTER — Ambulatory Visit: Payer: 59 | Admitting: Physical Therapy
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
## 2022-09-05 ENCOUNTER — Encounter: Payer: Self-pay | Admitting: Physical Therapy

## 2022-09-05 ENCOUNTER — Ambulatory Visit: Payer: 59 | Admitting: Physical Therapy

## 2022-09-05 DIAGNOSIS — R262 Difficulty in walking, not elsewhere classified: Secondary | ICD-10-CM

## 2022-09-05 DIAGNOSIS — Z981 Arthrodesis status: Secondary | ICD-10-CM | POA: Diagnosis not present

## 2022-09-05 DIAGNOSIS — I69351 Hemiplegia and hemiparesis following cerebral infarction affecting right dominant side: Secondary | ICD-10-CM

## 2022-09-05 DIAGNOSIS — M6281 Muscle weakness (generalized): Secondary | ICD-10-CM

## 2022-09-06 ENCOUNTER — Encounter: Payer: 59 | Attending: Physical Medicine and Rehabilitation | Admitting: Physical Medicine and Rehabilitation

## 2022-09-06 DIAGNOSIS — I69351 Hemiplegia and hemiparesis following cerebral infarction affecting right dominant side: Secondary | ICD-10-CM | POA: Insufficient documentation

## 2022-09-06 DIAGNOSIS — M24571 Contracture, right ankle: Secondary | ICD-10-CM | POA: Insufficient documentation

## 2022-09-06 DIAGNOSIS — M24531 Contracture, right wrist: Secondary | ICD-10-CM | POA: Insufficient documentation

## 2022-09-06 DIAGNOSIS — M24574 Contracture, right foot: Secondary | ICD-10-CM | POA: Insufficient documentation

## 2022-09-12 DIAGNOSIS — G8928 Other chronic postprocedural pain: Secondary | ICD-10-CM | POA: Diagnosis not present

## 2022-09-12 DIAGNOSIS — I69398 Other sequelae of cerebral infarction: Secondary | ICD-10-CM | POA: Diagnosis not present

## 2022-09-14 ENCOUNTER — Ambulatory Visit (HOSPITAL_COMMUNITY)
Admission: EM | Admit: 2022-09-14 | Discharge: 2022-09-14 | Disposition: A | Discharge status: Home / Self Care | Payer: 59 | Attending: Emergency Medicine | Admitting: Emergency Medicine

## 2022-09-14 ENCOUNTER — Encounter (HOSPITAL_COMMUNITY): Payer: Self-pay

## 2022-09-14 DIAGNOSIS — M62838 Other muscle spasm: Secondary | ICD-10-CM | POA: Diagnosis not present

## 2022-09-14 MED ORDER — PREDNISONE 20 MG PO TABS: 40.0000 mg | ORAL_TABLET | Freq: Every day | ORAL | 0 refills | Status: AC

## 2022-09-14 MED ORDER — NAPROXEN 500 MG PO TABS: 500.0000 mg | ORAL_TABLET | Freq: Two times a day (BID) | ORAL | 0 refills | Status: DC

## 2022-09-14 MED ORDER — METHOCARBAMOL 500 MG PO TABS: 500.0000 mg | ORAL_TABLET | Freq: Two times a day (BID) | ORAL | 0 refills | Status: DC

## 2022-09-14 MED ORDER — KETOROLAC TROMETHAMINE 30 MG/ML IJ SOLN
INTRAMUSCULAR | Status: AC
  Filled 2022-09-14: qty 1

## 2022-09-14 MED ORDER — KETOROLAC TROMETHAMINE 30 MG/ML IJ SOLN
30.0000 mg | Freq: Once | INTRAMUSCULAR | Status: AC
  Administered 2022-09-14: 30 mg via INTRAMUSCULAR
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
## 2022-09-14 NOTE — ED Triage Notes (Signed)
Patient here today with c/o left side neck pain that radiates dow her left shoulder and arm that has been worsening over the past week. She has some numb and tingling sensation in her left index finger. No known injury. She has tried taking Tylenol, IBU, and BC Powder with no relief.

## 2022-09-14 NOTE — Discharge Instructions (Addendum)
You appear to have a muscle spasm in your trapezius.  We have given you a shot of Toradol in clinic for pain.  Starting tomorrow you can take the naproxen twice daily, please take this with food to prevent gastrointestinal upset.  You can start the muscle relaxer tonight, do not drink or drive on this as it may cause drowsiness.  Start the steroids tomorrow with breakfast.  You can continue to do warm or cold compresses to the area.  If this does not help with your symptoms, I suggest following up with an orthopedic for further evaluation.  Please return to clinic for any new or urgent symptoms.  Seek immediate pain if you develop left arm weakness, loss of sensation, or any new concerning symptoms.

## 2022-09-16 DIAGNOSIS — M24531 Contracture, right wrist: Secondary | ICD-10-CM | POA: Diagnosis not present

## 2022-09-16 DIAGNOSIS — M24521 Contracture, right elbow: Secondary | ICD-10-CM | POA: Diagnosis not present

## 2022-09-16 DIAGNOSIS — M25521 Pain in right elbow: Secondary | ICD-10-CM | POA: Diagnosis not present

## 2022-09-16 DIAGNOSIS — M25531 Pain in right wrist: Secondary | ICD-10-CM | POA: Diagnosis not present
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
## 2022-09-19 ENCOUNTER — Ambulatory Visit: Payer: 59 | Attending: Orthopedic Surgery | Admitting: Physical Therapy

## 2022-09-19 DIAGNOSIS — R262 Difficulty in walking, not elsewhere classified: Secondary | ICD-10-CM | POA: Diagnosis not present

## 2022-09-19 DIAGNOSIS — I69351 Hemiplegia and hemiparesis following cerebral infarction affecting right dominant side: Secondary | ICD-10-CM | POA: Diagnosis not present

## 2022-09-19 DIAGNOSIS — M6281 Muscle weakness (generalized): Secondary | ICD-10-CM | POA: Insufficient documentation

## 2022-09-19 DIAGNOSIS — Z981 Arthrodesis status: Secondary | ICD-10-CM | POA: Diagnosis not present
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
## 2022-09-26 ENCOUNTER — Ambulatory Visit: Payer: 59 | Admitting: Physical Therapy

## 2022-09-27 DIAGNOSIS — M25512 Pain in left shoulder: Secondary | ICD-10-CM | POA: Diagnosis not present
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
## 2022-10-03 ENCOUNTER — Ambulatory Visit: Payer: 59 | Admitting: Physical Therapy

## 2022-10-13 DIAGNOSIS — I69398 Other sequelae of cerebral infarction: Secondary | ICD-10-CM | POA: Diagnosis not present

## 2022-10-13 DIAGNOSIS — G8928 Other chronic postprocedural pain: Secondary | ICD-10-CM | POA: Diagnosis not present

## 2022-11-13 DIAGNOSIS — G8928 Other chronic postprocedural pain: Secondary | ICD-10-CM | POA: Diagnosis not present

## 2022-11-13 DIAGNOSIS — I69398 Other sequelae of cerebral infarction: Secondary | ICD-10-CM | POA: Diagnosis not present

## 2022-11-28 ENCOUNTER — Telehealth: Payer: Self-pay | Admitting: Orthopedic Surgery

## 2022-11-28 NOTE — Telephone Encounter (Signed)
I called pt and she says that she did PT and is still having trouble with her shoulder. She is also having pain in her heel and I offered appt for this Thursday as we had a cancellation at1:30. She will come in and discuss both this week.

## 2022-11-28 NOTE — Telephone Encounter (Signed)
Patient called asked who did Dr. Lajoyce Corners refer her to for her right shoulder? Patient said her shoulder is still bothering her and drooping. The number to contact patient is [phone]

## 2022-12-01 ENCOUNTER — Ambulatory Visit: Payer: 59 | Admitting: Orthopedic Surgery

## 2022-12-13 DIAGNOSIS — G8928 Other chronic postprocedural pain: Secondary | ICD-10-CM | POA: Diagnosis not present

## 2022-12-13 DIAGNOSIS — I69398 Other sequelae of cerebral infarction: Secondary | ICD-10-CM | POA: Diagnosis not present

## 2022-12-19 ENCOUNTER — Encounter: Payer: Self-pay | Admitting: Orthopedic Surgery

## 2022-12-19 ENCOUNTER — Ambulatory Visit: Payer: 59 | Admitting: Orthopedic Surgery

## 2022-12-19 DIAGNOSIS — M79671 Pain in right foot: Secondary | ICD-10-CM

## 2022-12-19 DIAGNOSIS — M21371 Foot drop, right foot: Secondary | ICD-10-CM

## 2022-12-20 ENCOUNTER — Encounter: Payer: Self-pay | Admitting: Orthopedic Surgery
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
## 2022-12-21 DIAGNOSIS — L600 Ingrowing nail: Secondary | ICD-10-CM | POA: Diagnosis not present

## 2022-12-21 DIAGNOSIS — L03031 Cellulitis of right toe: Secondary | ICD-10-CM | POA: Diagnosis not present

## 2022-12-26 ENCOUNTER — Other Ambulatory Visit: Payer: Self-pay | Admitting: Infectious Disease

## 2022-12-26 DIAGNOSIS — E785 Hyperlipidemia, unspecified: Secondary | ICD-10-CM

## 2022-12-26 DIAGNOSIS — B20 Human immunodeficiency virus [HIV] disease: Secondary | ICD-10-CM

## 2022-12-26 NOTE — Telephone Encounter (Signed)
Okay to refill? Was d/c when patient went to [facility] in July.

## 2022-12-26 NOTE — Telephone Encounter (Signed)
Please advise if okay to refill.   Garrison, [place] N, FNP discontinued 09/2022, no reason provided.

## 2022-12-30 ENCOUNTER — Encounter (HOSPITAL_COMMUNITY): Payer: Self-pay

## 2022-12-30 ENCOUNTER — Ambulatory Visit (HOSPITAL_COMMUNITY)
Admission: EM | Admit: 2022-12-30 | Discharge: 2022-12-30 | Disposition: A | Discharge status: Home / Self Care | Payer: 59 | Attending: Emergency Medicine | Admitting: Emergency Medicine

## 2022-12-30 ENCOUNTER — Ambulatory Visit (INDEPENDENT_AMBULATORY_CARE_PROVIDER_SITE_OTHER): Payer: 59

## 2022-12-30 DIAGNOSIS — R519 Headache, unspecified: Secondary | ICD-10-CM | POA: Diagnosis not present

## 2022-12-30 DIAGNOSIS — R0600 Dyspnea, unspecified: Secondary | ICD-10-CM | POA: Diagnosis not present

## 2022-12-30 DIAGNOSIS — R059 Cough, unspecified: Secondary | ICD-10-CM | POA: Diagnosis not present

## 2022-12-30 DIAGNOSIS — Z1152 Encounter for screening for COVID-19: Secondary | ICD-10-CM | POA: Diagnosis not present

## 2022-12-30 DIAGNOSIS — J189 Pneumonia, unspecified organism: Secondary | ICD-10-CM | POA: Insufficient documentation

## 2022-12-30 DIAGNOSIS — R918 Other nonspecific abnormal finding of lung field: Secondary | ICD-10-CM | POA: Diagnosis not present

## 2022-12-30 DIAGNOSIS — R0989 Other specified symptoms and signs involving the circulatory and respiratory systems: Secondary | ICD-10-CM | POA: Diagnosis not present

## 2022-12-30 HISTORY — DX: Anxiety disorder, unspecified: F41.9

## 2022-12-30 HISTORY — DX: Dizziness and giddiness: R42

## 2022-12-30 MED ORDER — ALBUTEROL SULFATE HFA 108 (90 BASE) MCG/ACT IN AERS
1.0000 | INHALATION_SPRAY | RESPIRATORY_TRACT | 0 refills | Status: DC | PRN
Start: 2022-12-30 — End: 2024-01-30

## 2022-12-30 MED ORDER — SPACER/AERO-HOLDING CHAMBERS DEVI: 1.0000 | 0 refills | Status: DC | PRN

## 2022-12-30 MED ORDER — ALBUTEROL SULFATE (2.5 MG/3ML) 0.083% IN NEBU
INHALATION_SOLUTION | RESPIRATORY_TRACT | Status: AC
  Filled 2022-12-30: qty 3

## 2022-12-30 MED ORDER — AZITHROMYCIN 250 MG PO TABS: 250.0000 mg | ORAL_TABLET | Freq: Every day | ORAL | 0 refills | Status: DC

## 2022-12-30 MED ORDER — ALBUTEROL SULFATE (2.5 MG/3ML) 0.083% IN NEBU
2.5000 mg | INHALATION_SOLUTION | Freq: Once | RESPIRATORY_TRACT | Status: AC
  Administered 2022-12-30: 2.5 mg via RESPIRATORY_TRACT

## 2022-12-30 NOTE — ED Triage Notes (Signed)
Sinus/ chest congestion/ headache/ body aches/scratchy throat X 1 WEEK. No known sick exposure.   Patient tried ibuprofen, Nyquil, and Dayquil with no relief.

## 2022-12-30 NOTE — Discharge Instructions (Addendum)
Your Mychart Username: Emoni. (The . Is included in your username). Type this into the [facility] Login and then click forgot password.   Finish all of the antibiotics, even if you are feeling better.

## 2022-12-31 LAB — SARS CORONAVIRUS 2 (TAT 6-24 HRS): SARS Coronavirus 2: NEGATIVE

## 2023-01-03 ENCOUNTER — Telehealth: Payer: Self-pay

## 2023-01-03 NOTE — Telephone Encounter (Signed)
Patient called, she was diagnosed with pneumonia at [facility] on 10/18. Her symptoms started on 10/11.  She was given a z-pack and albuterol at her UC visit. She has one pill of the z-pack left, but doesn't feel like she's improved much. She is still experiencing shortness of breath and weakness. Denies difficulty breathing and knows that if her symptoms worsen she should go back to UC or ED.   She would like Dr. Zenaida Niece Dam's advice on whether or not she needs an extended course of azithromycin.   Sandie Ano, RN

## 2023-01-03 NOTE — Telephone Encounter (Signed)
Called [place] to relay provider's message, no answer. Left HIPAA compliant voicemail requesting callback.   Sandie Ano, RN

## 2023-01-03 NOTE — Telephone Encounter (Signed)
Paula Martinez called back, relayed that Dr. Daiva Eves recommends she go back to urgent care for evaluation since her symptoms have not improved. Patient verbalized understanding and has no further questions.   Sandie Ano, RN

## 2023-01-04 DIAGNOSIS — L600 Ingrowing nail: Secondary | ICD-10-CM | POA: Diagnosis not present

## 2023-01-04 DIAGNOSIS — M7751 Other enthesopathy of right foot: Secondary | ICD-10-CM | POA: Diagnosis not present
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
## 2023-01-09 ENCOUNTER — Ambulatory Visit: Payer: 59 | Admitting: Orthopedic Surgery

## 2023-01-13 DIAGNOSIS — G8928 Other chronic postprocedural pain: Secondary | ICD-10-CM | POA: Diagnosis not present

## 2023-01-13 DIAGNOSIS — I69398 Other sequelae of cerebral infarction: Secondary | ICD-10-CM | POA: Diagnosis not present

## 2023-01-18 DIAGNOSIS — T8189XD Other complications of procedures, not elsewhere classified, subsequent encounter: Secondary | ICD-10-CM | POA: Diagnosis not present

## 2023-02-12 DIAGNOSIS — G8928 Other chronic postprocedural pain: Secondary | ICD-10-CM | POA: Diagnosis not present

## 2023-02-12 DIAGNOSIS — I69398 Other sequelae of cerebral infarction: Secondary | ICD-10-CM | POA: Diagnosis not present

## 2023-02-15 ENCOUNTER — Other Ambulatory Visit (INDEPENDENT_AMBULATORY_CARE_PROVIDER_SITE_OTHER): Payer: 59

## 2023-02-15 ENCOUNTER — Ambulatory Visit: Payer: 59 | Admitting: Orthopedic Surgery

## 2023-02-15 DIAGNOSIS — M79601 Pain in right arm: Secondary | ICD-10-CM

## 2023-02-17 ENCOUNTER — Encounter: Payer: Self-pay | Admitting: Orthopedic Surgery
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
## 2023-03-01 ENCOUNTER — Telehealth: Payer: Self-pay

## 2023-03-01 NOTE — Telephone Encounter (Signed)
Patient called office requesting provider fill out SCAT form for transportation. Is not working with PCP at the moment. Will go ahead and fax form for provider to review.  Juanita Laster, RMA

## 2023-03-02 NOTE — Telephone Encounter (Signed)
Left voicemail for patient stating we have not received SCAT forms. Requested she have case manager refax it. Juanita Laster, RMA

## 2023-03-02 NOTE — Telephone Encounter (Signed)
Received fax from [facility] for SCAT. Will inform provider that application is in triage.  Juanita Laster, RMA

## 2023-03-06 DIAGNOSIS — Z23 Encounter for immunization: Secondary | ICD-10-CM | POA: Diagnosis not present

## 2023-03-06 DIAGNOSIS — I69351 Hemiplegia and hemiparesis following cerebral infarction affecting right dominant side: Secondary | ICD-10-CM | POA: Diagnosis not present

## 2023-03-09 NOTE — Telephone Encounter (Signed)
Called patient and was able to fill out form. Understands that form requires her signature. Would like form mailed out to her. Understands she will need to send form back to [facility] before 1/10.  Copy placed in triage and scan folder Paula Martinez, RMA

## 2023-03-14 DIAGNOSIS — I69398 Other sequelae of cerebral infarction: Secondary | ICD-10-CM | POA: Diagnosis not present

## 2023-03-14 DIAGNOSIS — G8928 Other chronic postprocedural pain: Secondary | ICD-10-CM | POA: Diagnosis not present

## 2023-04-07 DIAGNOSIS — M24521 Contracture, right elbow: Secondary | ICD-10-CM | POA: Diagnosis not present

## 2023-04-07 DIAGNOSIS — M24531 Contracture, right wrist: Secondary | ICD-10-CM | POA: Diagnosis not present

## 2023-04-15 DIAGNOSIS — G8928 Other chronic postprocedural pain: Secondary | ICD-10-CM | POA: Diagnosis not present

## 2023-04-15 DIAGNOSIS — I69398 Other sequelae of cerebral infarction: Secondary | ICD-10-CM | POA: Diagnosis not present

## 2023-04-21 DIAGNOSIS — M24541 Contracture, right hand: Secondary | ICD-10-CM | POA: Diagnosis not present

## 2023-04-21 DIAGNOSIS — M24521 Contracture, right elbow: Secondary | ICD-10-CM | POA: Diagnosis not present

## 2023-04-21 DIAGNOSIS — M24531 Contracture, right wrist: Secondary | ICD-10-CM | POA: Diagnosis not present

## 2023-04-28 ENCOUNTER — Telehealth: Payer: Self-pay

## 2023-04-28 DIAGNOSIS — I69851 Hemiplegia and hemiparesis following other cerebrovascular disease affecting right dominant side: Secondary | ICD-10-CM

## 2023-04-28 DIAGNOSIS — B20 Human immunodeficiency virus [HIV] disease: Secondary | ICD-10-CM

## 2023-04-28 DIAGNOSIS — F4310 Post-traumatic stress disorder, unspecified: Secondary | ICD-10-CM

## 2023-04-28 DIAGNOSIS — A812 Progressive multifocal leukoencephalopathy: Secondary | ICD-10-CM

## 2023-04-28 DIAGNOSIS — N926 Irregular menstruation, unspecified: Secondary | ICD-10-CM

## 2023-04-28 DIAGNOSIS — Z79899 Other long term (current) drug therapy: Secondary | ICD-10-CM

## 2023-04-28 DIAGNOSIS — Z113 Encounter for screening for infections with a predominantly sexual mode of transmission: Secondary | ICD-10-CM

## 2023-04-28 DIAGNOSIS — F419 Anxiety disorder, unspecified: Secondary | ICD-10-CM

## 2023-04-28 MED ORDER — SYMTUZA 800-150-200-10 MG PO TABS: 1.0000 | ORAL_TABLET | Freq: Every day | ORAL | 1 refills | Status: DC

## 2023-04-28 NOTE — Addendum Note (Signed)
Addended by: Linna Hoff D on: 04/28/2023 09:39 AM   Modules accepted: Orders

## 2023-04-28 NOTE — Telephone Encounter (Signed)
Notified by front desk that patient requesting refill of Symtuza be sent to [facility] on [address] as well as lipitor. Called patient back to let her know she should have refills of lipitor on file, no answer.   Symtuza refill sent.   Sandie Ano, RN

## 2023-05-05 DIAGNOSIS — M25511 Pain in right shoulder: Secondary | ICD-10-CM | POA: Diagnosis not present

## 2023-05-15 DIAGNOSIS — G8928 Other chronic postprocedural pain: Secondary | ICD-10-CM | POA: Diagnosis not present

## 2023-05-15 DIAGNOSIS — I69398 Other sequelae of cerebral infarction: Secondary | ICD-10-CM | POA: Diagnosis not present

## 2023-05-16 ENCOUNTER — Other Ambulatory Visit: Payer: Self-pay

## 2023-05-16 DIAGNOSIS — Z113 Encounter for screening for infections with a predominantly sexual mode of transmission: Secondary | ICD-10-CM

## 2023-05-16 DIAGNOSIS — Z79899 Other long term (current) drug therapy: Secondary | ICD-10-CM

## 2023-05-16 DIAGNOSIS — B20 Human immunodeficiency virus [HIV] disease: Secondary | ICD-10-CM

## 2023-05-23 ENCOUNTER — Other Ambulatory Visit: Payer: 59

## 2023-05-30 DIAGNOSIS — G8918 Other acute postprocedural pain: Secondary | ICD-10-CM | POA: Diagnosis not present

## 2023-05-30 DIAGNOSIS — M24521 Contracture, right elbow: Secondary | ICD-10-CM | POA: Diagnosis not present

## 2023-05-30 DIAGNOSIS — I69398 Other sequelae of cerebral infarction: Secondary | ICD-10-CM | POA: Diagnosis not present

## 2023-06-05 ENCOUNTER — Other Ambulatory Visit

## 2023-06-05 NOTE — Progress Notes (Unsigned)
    [facility]   Virtual Visit via Video Note  I connected with Paula Martinez on 06/05/23 at 11:00 AM EDT by a video enabled telemedicine application and verified that I am speaking with the correct person using two identifiers.  Location: Patient: Home Provider: [facility]   I discussed the limitations of evaluation and management by telemedicine and the availability of in person appointments. The patient expressed understanding and agreed to proceed.  History of Present Illness:  52year old [place] American lady originally from [place] where she was diagnosed with HIV/AIDS in 1992. She eventually moved to [facility], [place] and was being followed by Dr. De Nurse at [facility]. 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    We consolidated her to [facility].   Underwent successful surgery to address her contractures by Dr. Toni Arthurs in July 2019.   [name] saw happening for Pap smear which was reassuring.  Viral load remains undetectable.  Ever since she has had surgery with Dr. Victorino Dike she has been able to walk now which is really impressive she is also working with [facility] and getting Botox injections in her right arm where she has spasticity.      Now undergone ankle fusion with Dr. Lajoyce Corners.   As I last saw her she has now undergone biceps tendon surgery with physician in [facility] which has been successful she believes she is currently in a cast she is coming for labs later today but did video appointment this morning.  She is doing quite well and is in good spirits.     Observations/Objective:  Appeared quite comfortable on the video feed Assessment and Plan:  HIV disease:  I will add order HIV viral load  CD4 count CBC with differential CMP, RPR GC and chlamydia and I will continue  [name]'s SYMTUZA prescription  Upper lipidemia: I have asked her to start the tort that I prescribed explaining that this is more important for its anti-inflammatory effect rather than cholesterol lowering effect and result reviewed the results of reprieve with her again.  I have personally spent 30 minutes involved in face-to-face and non-face-to-face activities for this patient on the day of the visit. Professional time spent includes the following activities: Preparing to see the patient (review of tests), Obtaining and/or reviewing separately obtained history (admission/discharge record), Performing a medically appropriate examination and/or evaluation , Ordering medications/tests/procedures, referring and communicating with other health care professionals, Documenting clinical information in the EMR, Independently interpreting results (not separately reported), Communicating results to the patient/family/caregiver, Counseling and educating the patient/family/caregiver and Care coordination (not separately reported).   Follow Up Instructions:    I discussed the assessment and treatment plan with the patient. The patient was provided an opportunity to ask questions and all were answered. The patient agreed with the plan and demonstrated an understanding of the instructions.   The patient was advised to call back or seek an in-person evaluation if the symptoms worsen or if the condition fails to improve as anticipated.  I provided 30 minutes of video face to face and non-face-to-face time during this encounter.   Acey Lav, MD

## 2023-06-06 ENCOUNTER — Encounter: Payer: Self-pay | Admitting: Infectious Disease

## 2023-06-06 ENCOUNTER — Other Ambulatory Visit: Payer: Self-pay

## 2023-06-06 ENCOUNTER — Other Ambulatory Visit

## 2023-06-06 ENCOUNTER — Telehealth (INDEPENDENT_AMBULATORY_CARE_PROVIDER_SITE_OTHER): Payer: Self-pay | Admitting: Infectious Disease

## 2023-06-06 VITALS — Ht 67.0 in

## 2023-06-06 DIAGNOSIS — Z79899 Other long term (current) drug therapy: Secondary | ICD-10-CM

## 2023-06-06 DIAGNOSIS — B20 Human immunodeficiency virus [HIV] disease: Secondary | ICD-10-CM

## 2023-06-06 DIAGNOSIS — A812 Progressive multifocal leukoencephalopathy: Secondary | ICD-10-CM

## 2023-06-06 DIAGNOSIS — E785 Hyperlipidemia, unspecified: Secondary | ICD-10-CM | POA: Diagnosis not present

## 2023-06-06 DIAGNOSIS — Z124 Encounter for screening for malignant neoplasm of cervix: Secondary | ICD-10-CM

## 2023-06-06 DIAGNOSIS — Z7185 Encounter for immunization safety counseling: Secondary | ICD-10-CM

## 2023-06-06 DIAGNOSIS — Z113 Encounter for screening for infections with a predominantly sexual mode of transmission: Secondary | ICD-10-CM

## 2023-06-06 MED ORDER — ATORVASTATIN CALCIUM 10 MG PO TABS
10.0000 mg | ORAL_TABLET | Freq: Every day | ORAL | 3 refills | Status: DC
Start: 2023-06-06 — End: 2023-09-19

## 2023-06-06 MED ORDER — SYMTUZA 800-150-200-10 MG PO TABS
1.0000 | ORAL_TABLET | Freq: Every day | ORAL | 1 refills | Status: DC
Start: 2023-06-06 — End: 2023-09-19

## 2023-06-07 ENCOUNTER — Ambulatory Visit: Payer: 59 | Admitting: Infectious Disease

## 2023-06-08 ENCOUNTER — Ambulatory Visit

## 2023-06-08 ENCOUNTER — Other Ambulatory Visit: Payer: Self-pay

## 2023-06-08 LAB — CBC WITH DIFFERENTIAL/PLATELET
Absolute Lymphocytes: 2236 {cells}/uL (ref 850–3900)
Absolute Monocytes: 485 {cells}/uL (ref 200–950)
Basophils Absolute: 43 {cells}/uL (ref 0–200)
Basophils Relative: 0.5 %
Eosinophils Absolute: 68 {cells}/uL (ref 15–500)
Eosinophils Relative: 0.8 %
HCT: 39.7 % (ref 35.0–45.0)
Hemoglobin: 13.2 g/dL (ref 11.7–15.5)
MCH: 28 pg (ref 27.0–33.0)
MCHC: 33.2 g/dL (ref 32.0–36.0)
MCV: 84.1 fL (ref 80.0–100.0)
MPV: 11.8 fL (ref 7.5–12.5)
Monocytes Relative: 5.7 %
Neutro Abs: 5670 {cells}/uL (ref 1500–7800)
Neutrophils Relative %: 66.7 %
Platelets: 256 10*3/uL (ref 140–400)
RBC: 4.72 10*6/uL (ref 3.80–5.10)
RDW: 12.9 % (ref 11.0–15.0)
Total Lymphocyte: 26.3 %
WBC: 8.5 10*3/uL (ref 3.8–10.8)

## 2023-06-08 LAB — COMPLETE METABOLIC PANEL WITHOUT GFR
AG Ratio: 1.2 (calc) (ref 1.0–2.5)
ALT: 18 U/L (ref 6–29)
AST: 13 U/L (ref 10–35)
Albumin: 3.9 g/dL (ref 3.6–5.1)
Alkaline phosphatase (APISO): 96 U/L (ref 37–153)
BUN: 12 mg/dL (ref 7–25)
CO2: 20 mmol/L (ref 20–32)
Calcium: 9.3 mg/dL (ref 8.6–10.4)
Chloride: 106 mmol/L (ref 98–110)
Creat: 0.57 mg/dL (ref 0.50–1.03)
Globulin: 3.2 g/dL (ref 1.9–3.7)
Glucose, Bld: 113 mg/dL — ABNORMAL HIGH (ref 65–99)
Potassium: 3.7 mmol/L (ref 3.5–5.3)
Sodium: 138 mmol/L (ref 135–146)
Total Bilirubin: 0.4 mg/dL (ref 0.2–1.2)
Total Protein: 7.1 g/dL (ref 6.1–8.1)

## 2023-06-08 LAB — T-HELPER CELL (CD4) - (RCID CLINIC ONLY)
CD4 % Helper T Cell: 35 % (ref 33–65)
CD4 T Cell Abs: 769 /uL (ref 400–1790)

## 2023-06-08 LAB — LIPID PANEL
Cholesterol: 187 mg/dL (ref <200)
HDL: 73 mg/dL (ref >=50)
LDL Cholesterol (Calc): 89 mg/dL
Non-HDL Cholesterol (Calc): 114 mg/dL (ref <130)
Total CHOL/HDL Ratio: 2.6 (calc) (ref <5.0)
Triglycerides: 152 mg/dL — ABNORMAL HIGH (ref <150)

## 2023-06-08 LAB — RPR: RPR Ser Ql: NONREACTIVE

## 2023-06-08 LAB — HIV-1 RNA QUANT-NO REFLEX-BLD
HIV 1 RNA Quant: 49 {copies}/mL — ABNORMAL HIGH
HIV-1 RNA Quant, Log: 1.69 {Log_copies}/mL — ABNORMAL HIGH

## 2023-06-15 DIAGNOSIS — I69398 Other sequelae of cerebral infarction: Secondary | ICD-10-CM | POA: Diagnosis not present

## 2023-06-15 DIAGNOSIS — G8928 Other chronic postprocedural pain: Secondary | ICD-10-CM | POA: Diagnosis not present

## 2023-06-16 DIAGNOSIS — M24521 Contracture, right elbow: Secondary | ICD-10-CM | POA: Diagnosis not present

## 2023-06-16 DIAGNOSIS — Z4789 Encounter for other orthopedic aftercare: Secondary | ICD-10-CM | POA: Diagnosis not present

## 2023-06-28 DIAGNOSIS — Z789 Other specified health status: Secondary | ICD-10-CM | POA: Diagnosis not present

## 2023-06-28 DIAGNOSIS — M25621 Stiffness of right elbow, not elsewhere classified: Secondary | ICD-10-CM | POA: Diagnosis not present

## 2023-07-15 DIAGNOSIS — I69398 Other sequelae of cerebral infarction: Secondary | ICD-10-CM | POA: Diagnosis not present

## 2023-07-15 DIAGNOSIS — G8928 Other chronic postprocedural pain: Secondary | ICD-10-CM | POA: Diagnosis not present

## 2023-07-17 DIAGNOSIS — M24521 Contracture, right elbow: Secondary | ICD-10-CM | POA: Diagnosis not present

## 2023-07-18 ENCOUNTER — Ambulatory Visit

## 2023-07-18 ENCOUNTER — Other Ambulatory Visit: Payer: Self-pay

## 2023-07-19 DIAGNOSIS — M25621 Stiffness of right elbow, not elsewhere classified: Secondary | ICD-10-CM | POA: Diagnosis not present

## 2023-07-19 DIAGNOSIS — Z789 Other specified health status: Secondary | ICD-10-CM | POA: Diagnosis not present

## 2023-08-11 NOTE — Progress Notes (Signed)
 The ASCVD Risk score (Arnett DK, et al., 2019) failed to calculate for the following reasons:   Risk score cannot be calculated because patient has a medical history suggesting prior/existing ASCVD  Arlon Bergamo, BSN, RN

## 2023-08-15 DIAGNOSIS — G8928 Other chronic postprocedural pain: Secondary | ICD-10-CM | POA: Diagnosis not present

## 2023-08-15 DIAGNOSIS — I69398 Other sequelae of cerebral infarction: Secondary | ICD-10-CM | POA: Diagnosis not present

## 2023-08-17 ENCOUNTER — Ambulatory Visit

## 2023-08-17 ENCOUNTER — Other Ambulatory Visit: Payer: Self-pay

## 2023-09-13 ENCOUNTER — Other Ambulatory Visit: Payer: Self-pay

## 2023-09-13 DIAGNOSIS — E785 Hyperlipidemia, unspecified: Secondary | ICD-10-CM

## 2023-09-13 DIAGNOSIS — B20 Human immunodeficiency virus [HIV] disease: Secondary | ICD-10-CM

## 2023-09-14 DIAGNOSIS — I69398 Other sequelae of cerebral infarction: Secondary | ICD-10-CM | POA: Diagnosis not present

## 2023-09-14 DIAGNOSIS — G8928 Other chronic postprocedural pain: Secondary | ICD-10-CM | POA: Diagnosis not present

## 2023-09-19 ENCOUNTER — Other Ambulatory Visit: Payer: Self-pay

## 2023-09-19 ENCOUNTER — Other Ambulatory Visit: Payer: Self-pay | Admitting: Pharmacist

## 2023-09-19 ENCOUNTER — Other Ambulatory Visit (HOSPITAL_COMMUNITY): Payer: Self-pay

## 2023-09-19 DIAGNOSIS — E785 Hyperlipidemia, unspecified: Secondary | ICD-10-CM

## 2023-09-19 DIAGNOSIS — B20 Human immunodeficiency virus [HIV] disease: Secondary | ICD-10-CM

## 2023-09-19 MED ORDER — ATORVASTATIN CALCIUM 10 MG PO TABS
10.0000 mg | ORAL_TABLET | Freq: Every day | ORAL | 1 refills | Status: DC
  Filled 2023-09-19: qty 90, 90d supply, fill #0

## 2023-09-19 MED ORDER — SYMTUZA 800-150-200-10 MG PO TABS
1.0000 | ORAL_TABLET | Freq: Every day | ORAL | 5 refills | Status: DC
  Filled 2023-09-19: qty 30, 30d supply, fill #0
  Filled 2023-10-20 – 2023-10-23 (×2): qty 30, 30d supply, fill #1
  Filled 2023-11-15: qty 30, 30d supply, fill #2

## 2023-09-19 NOTE — Progress Notes (Signed)
 Specialty Pharmacy Initial Fill Coordination Note  Paula Martinez is a 53 y.o. female contacted today regarding initial fill of specialty medication(s) Darun-Cobic-Emtricit-TenofAF (Symtuza )   Patient requested Delivery   Delivery date: 10/13/23   Verified address: [address] [place] [address]   Medication will be filled on 10/12/23.   Patient is aware of $0 copayment.

## 2023-09-19 NOTE — Progress Notes (Signed)
 Specialty Pharmacy Initiation Note   Paula Martinez is a 53 y.o. female who will be followed by the specialty pharmacy service for RxSp HIV    Review of administration, indication, effectiveness, safety, potential side effects, storage/disposable, and missed dose instructions occurred today for patient's specialty medication(s) Darun-Cobic-Emtricit-TenofAF (Symtuza )     Patient/Caregiver did not have any additional questions or concerns.   Patient's therapy is appropriate to: Initiate    Goals Addressed             This Visit's Progress    Achieve Undetectable HIV Viral Load < 20       Patient is on track. Patient will work on increased adherence.      Comply with lab assessments       Patient is initiating therapy. Patient will adhere to provider and/or lab appointments.      Improve or maintain quality of life       Patient is initiating therapy. Patient will be monitored by provider to determine if a change in treatment plan is warranted.        Paula Martinez, PharmD, BCIDP, AAHIVP, CPP Infectious Diseases Clinical Pharmacist Practitioner Clinical Pharmacist Lead, Specialty Pharmacy [facility] 09/19/2023, 3:32 PM

## 2023-09-20 ENCOUNTER — Other Ambulatory Visit: Payer: Self-pay

## 2023-09-20 ENCOUNTER — Other Ambulatory Visit (HOSPITAL_COMMUNITY): Payer: Self-pay

## 2023-09-21 ENCOUNTER — Other Ambulatory Visit: Payer: Self-pay | Admitting: Infectious Disease

## 2023-09-21 DIAGNOSIS — B20 Human immunodeficiency virus [HIV] disease: Secondary | ICD-10-CM

## 2023-09-25 ENCOUNTER — Ambulatory Visit: Payer: Self-pay | Admitting: Infectious Disease

## 2023-09-25 ENCOUNTER — Other Ambulatory Visit: Payer: Self-pay

## 2023-09-25 DIAGNOSIS — B20 Human immunodeficiency virus [HIV] disease: Secondary | ICD-10-CM

## 2023-09-25 DIAGNOSIS — A812 Progressive multifocal leukoencephalopathy: Secondary | ICD-10-CM

## 2023-09-25 DIAGNOSIS — E785 Hyperlipidemia, unspecified: Secondary | ICD-10-CM

## 2023-09-25 NOTE — Progress Notes (Signed)
 Patient called regarding delivery. She was unaware that we could not fill medication until 7/31, told patient that we showed it was filled at [facility] and too soon for us  to send to her. Patient advised that she had not picked up from [facility]. I called [facility] and had medication reversed. Will fill and ship 09/25/23 for delivery on 10/06/23. LVM to update patient with new delivery date.

## 2023-09-26 ENCOUNTER — Other Ambulatory Visit: Payer: Self-pay

## 2023-10-13 DIAGNOSIS — M24521 Contracture, right elbow: Secondary | ICD-10-CM | POA: Diagnosis not present

## 2023-10-13 DIAGNOSIS — M24531 Contracture, right wrist: Secondary | ICD-10-CM | POA: Diagnosis not present

## 2023-10-15 DIAGNOSIS — I69398 Other sequelae of cerebral infarction: Secondary | ICD-10-CM | POA: Diagnosis not present

## 2023-10-15 DIAGNOSIS — G8928 Other chronic postprocedural pain: Secondary | ICD-10-CM | POA: Diagnosis not present

## 2023-10-19 ENCOUNTER — Other Ambulatory Visit (HOSPITAL_COMMUNITY): Payer: Self-pay

## 2023-10-20 ENCOUNTER — Other Ambulatory Visit: Payer: Self-pay

## 2023-10-23 ENCOUNTER — Other Ambulatory Visit: Payer: Self-pay

## 2023-10-23 NOTE — Progress Notes (Signed)
 Specialty Pharmacy Refill Coordination Note  Paula Martinez is a 53 y.o. female contacted today regarding refills of specialty medication(s) Darun-Cobic-Emtricit-TenofAF (Symtuza )   Patient requested Delivery   Delivery date: 10/24/23   Verified address: [address] [place] [address]   Medication will be filled on 10/23/23.

## 2023-10-27 ENCOUNTER — Encounter: Admitting: Physical Medicine and Rehabilitation

## 2023-11-15 ENCOUNTER — Other Ambulatory Visit: Payer: Self-pay

## 2023-11-15 DIAGNOSIS — G8928 Other chronic postprocedural pain: Secondary | ICD-10-CM | POA: Diagnosis not present

## 2023-11-15 DIAGNOSIS — I69398 Other sequelae of cerebral infarction: Secondary | ICD-10-CM | POA: Diagnosis not present

## 2023-11-17 ENCOUNTER — Other Ambulatory Visit: Payer: Self-pay

## 2023-11-17 NOTE — Progress Notes (Signed)
 Specialty Pharmacy Refill Coordination Note  Paula Martinez is a 53 y.o. female contacted today regarding refills of specialty medication(s) Darun-Cobic-Emtricit-TenofAF (Symtuza )   Patient requested Delivery   Delivery date: 11/21/23   Verified address: [address] [place] [address]   Medication will be filled on 11/20/23.
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
## 2023-11-20 ENCOUNTER — Other Ambulatory Visit: Payer: Self-pay

## 2023-11-20 ENCOUNTER — Other Ambulatory Visit: Payer: Self-pay | Admitting: Infectious Disease

## 2023-11-20 ENCOUNTER — Other Ambulatory Visit (HOSPITAL_COMMUNITY): Payer: Self-pay

## 2023-11-20 ENCOUNTER — Encounter: Payer: Self-pay | Admitting: Infectious Disease

## 2023-11-20 ENCOUNTER — Ambulatory Visit (INDEPENDENT_AMBULATORY_CARE_PROVIDER_SITE_OTHER): Admitting: Infectious Disease

## 2023-11-20 VITALS — BP 153/85 | HR 76 | Temp 98.5°F | Wt 194.0 lb

## 2023-11-20 DIAGNOSIS — E785 Hyperlipidemia, unspecified: Secondary | ICD-10-CM | POA: Diagnosis not present

## 2023-11-20 DIAGNOSIS — M62439 Contracture of muscle, unspecified forearm: Secondary | ICD-10-CM | POA: Diagnosis not present

## 2023-11-20 DIAGNOSIS — B20 Human immunodeficiency virus [HIV] disease: Secondary | ICD-10-CM

## 2023-11-20 DIAGNOSIS — Z23 Encounter for immunization: Secondary | ICD-10-CM

## 2023-11-20 DIAGNOSIS — Z981 Arthrodesis status: Secondary | ICD-10-CM

## 2023-11-20 DIAGNOSIS — F17200 Nicotine dependence, unspecified, uncomplicated: Secondary | ICD-10-CM

## 2023-11-20 DIAGNOSIS — A812 Progressive multifocal leukoencephalopathy: Secondary | ICD-10-CM

## 2023-11-20 DIAGNOSIS — M79672 Pain in left foot: Secondary | ICD-10-CM | POA: Diagnosis not present

## 2023-11-20 DIAGNOSIS — Z1231 Encounter for screening mammogram for malignant neoplasm of breast: Secondary | ICD-10-CM

## 2023-11-20 MED ORDER — BIKTARVY 50-200-25 MG PO TABS
1.0000 | ORAL_TABLET | Freq: Every day | ORAL | 11 refills | Status: AC
  Filled 2023-11-20: qty 30, 30d supply, fill #0
  Filled 2023-12-19: qty 30, 30d supply, fill #1
  Filled 2024-01-12: qty 30, 30d supply, fill #2
  Filled 2024-02-09: qty 30, 30d supply, fill #3
  Filled 2024-03-08: qty 30, 30d supply, fill #4
  Filled 2024-04-10: qty 30, 30d supply, fill #5

## 2023-11-20 MED ORDER — GABAPENTIN 300 MG PO CAPS
300.0000 mg | ORAL_CAPSULE | Freq: Every day | ORAL | 2 refills | Status: AC
  Filled 2023-11-20: qty 60, 37d supply, fill #0
  Filled 2023-11-23: qty 60, 60d supply, fill #0

## 2023-11-20 NOTE — Progress Notes (Signed)
 Specialty Pharmacy Refill Coordination Note  Paula Martinez is a 53 y.o. female contacted today regarding refills of specialty medication(s) Bictegravir-Emtricitab-Tenofov (Biktarvy )   Patient requested Delivery   Delivery date: 11/22/23   Verified address: [address] [place] [address]   Medication will be filled on 11/21/23.

## 2023-11-21 ENCOUNTER — Other Ambulatory Visit: Payer: Self-pay

## 2023-11-21 ENCOUNTER — Other Ambulatory Visit (HOSPITAL_COMMUNITY): Payer: Self-pay

## 2023-11-21 LAB — T-HELPER CELLS (CD4) COUNT (NOT AT ARMC)
CD4 % Helper T Cell: 37 % (ref 33–65)
CD4 T Cell Abs: 832 /uL (ref 400–1790)

## 2023-11-22 ENCOUNTER — Other Ambulatory Visit (HOSPITAL_COMMUNITY): Payer: Self-pay

## 2023-11-22 ENCOUNTER — Other Ambulatory Visit: Payer: Self-pay

## 2023-11-22 LAB — CBC WITH DIFFERENTIAL/PLATELET
Absolute Lymphocytes: 2598 {cells}/uL (ref 850–3900)
Absolute Monocytes: 198 {cells}/uL — ABNORMAL LOW (ref 200–950)
Basophils Absolute: 31 {cells}/uL (ref 0–200)
Basophils Relative: 0.5 %
Eosinophils Absolute: 112 {cells}/uL (ref 15–500)
Eosinophils Relative: 1.8 %
HCT: 46 % — ABNORMAL HIGH (ref 35.0–45.0)
Hemoglobin: 15.3 g/dL (ref 11.7–15.5)
MCH: 28.5 pg (ref 27.0–33.0)
MCHC: 33.3 g/dL (ref 32.0–36.0)
MCV: 85.8 fL (ref 80.0–100.0)
MPV: 12.7 fL — ABNORMAL HIGH (ref 7.5–12.5)
Monocytes Relative: 3.2 %
Neutro Abs: 3261 {cells}/uL (ref 1500–7800)
Neutrophils Relative %: 52.6 %
Platelets: 280 Thousand/uL (ref 140–400)
RBC: 5.36 Million/uL — ABNORMAL HIGH (ref 3.80–5.10)
RDW: 13.1 % (ref 11.0–15.0)
Total Lymphocyte: 41.9 %
WBC: 6.2 Thousand/uL (ref 3.8–10.8)

## 2023-11-22 LAB — COMPLETE METABOLIC PANEL WITHOUT GFR
AG Ratio: 1.3 (calc) (ref 1.0–2.5)
ALT: 11 U/L (ref 6–29)
AST: 12 U/L (ref 10–35)
Albumin: 4.3 g/dL (ref 3.6–5.1)
Alkaline phosphatase (APISO): 96 U/L (ref 37–153)
BUN: 13 mg/dL (ref 7–25)
CO2: 22 mmol/L (ref 20–32)
Calcium: 9.3 mg/dL (ref 8.6–10.4)
Chloride: 108 mmol/L (ref 98–110)
Creat: 0.73 mg/dL (ref 0.50–1.03)
Globulin: 3.2 g/dL (ref 1.9–3.7)
Glucose, Bld: 97 mg/dL (ref 65–99)
Potassium: 3.3 mmol/L — ABNORMAL LOW (ref 3.5–5.3)
Sodium: 141 mmol/L (ref 135–146)
Total Bilirubin: 0.4 mg/dL (ref 0.2–1.2)
Total Protein: 7.5 g/dL (ref 6.1–8.1)

## 2023-11-22 LAB — HEPATITIS A ANTIBODY, TOTAL: Hepatitis A AB,Total: REACTIVE — AB

## 2023-11-22 LAB — RPR: RPR Ser Ql: NONREACTIVE

## 2023-11-22 LAB — LIPID PANEL
Cholesterol: 176 mg/dL (ref <200)
HDL: 55 mg/dL (ref >=50)
LDL Cholesterol (Calc): 98 mg/dL
Non-HDL Cholesterol (Calc): 121 mg/dL (ref <130)
Total CHOL/HDL Ratio: 3.2 (calc) (ref <5.0)
Triglycerides: 136 mg/dL (ref <150)

## 2023-11-22 LAB — HIV-1 RNA QUANT-NO REFLEX-BLD
HIV 1 RNA Quant: <20 {copies}/mL — AB
HIV-1 RNA Quant, Log: <1.3 {Log_copies}/mL — AB

## 2023-11-22 LAB — HEPATITIS B SURFACE ANTIBODY, QUANTITATIVE: Hep B S AB Quant (Post): 53 m[IU]/mL (ref >=10)

## 2023-11-23 ENCOUNTER — Other Ambulatory Visit: Payer: Self-pay

## 2023-11-23 ENCOUNTER — Other Ambulatory Visit (HOSPITAL_COMMUNITY): Payer: Self-pay

## 2023-11-23 ENCOUNTER — Telehealth: Payer: Self-pay

## 2023-11-23 ENCOUNTER — Encounter: Payer: Self-pay | Admitting: Physical Medicine and Rehabilitation

## 2023-11-23 ENCOUNTER — Encounter: Attending: Physical Medicine and Rehabilitation | Admitting: Physical Medicine and Rehabilitation

## 2023-11-23 VITALS — BP 119/74 | HR 72 | Ht 67.0 in

## 2023-11-23 DIAGNOSIS — I69351 Hemiplegia and hemiparesis following cerebral infarction affecting right dominant side: Secondary | ICD-10-CM | POA: Insufficient documentation

## 2023-11-23 MED ORDER — ONABOTULINUMTOXINA 100 UNITS IJ SOLR
400.0000 [IU] | Freq: Once | INTRAMUSCULAR | Status: AC
  Administered 2023-11-23: 400 [IU] via INTRAMUSCULAR

## 2023-11-23 NOTE — Telephone Encounter (Signed)
 Patient left voicemail requesting Rx for Ensure Plus be sent to [facility] for loss of appetite.   Cassidie Veiga, BSN, RN

## 2023-11-23 NOTE — Telephone Encounter (Signed)
 Spoke with patient, discussed that Ensure prescriptions are typically written when BMI < 19. Encouraged her to follow up with PCP to work up concerns for loss of appetite. Patient agreeable with plan.   Korbin Mapps, BSN, RN

## 2023-11-23 NOTE — Progress Notes (Signed)
 Botox  Injection for spasticity using needle ultrasound guidance  Indication: Severe spasticity which interferes with ADL,mobility and/or  hygiene and is unresponsive to medication management and other conservative care Informed consent was obtained after describing risks and benefits of the procedure with the patient. This includes bleeding, bruising, infection, excessive weakness, or medication side effects. A REMS form is on file and signed. Number of units per muscle FCR 100U 1 site FCU: 100U divided in 2 injection sites FDS 100 U divided in 2 injection sites Trapezius: 100U divided into 3 injection sites Biceps: 50U 1 site  All injections were done after obtaining appropriate EMG activity and after negative drawback for blood. The patient tolerated the procedure well. Post procedure instructions were given. A followup appointment was made.    Prescribed Zynex Nexwave and heating/cooling blanket

## 2023-11-24 ENCOUNTER — Other Ambulatory Visit (HOSPITAL_COMMUNITY): Payer: Self-pay

## 2023-11-24 ENCOUNTER — Encounter: Payer: Self-pay | Admitting: Infectious Disease

## 2023-11-24 DIAGNOSIS — M24531 Contracture, right wrist: Secondary | ICD-10-CM | POA: Diagnosis not present

## 2023-11-30 ENCOUNTER — Other Ambulatory Visit (HOSPITAL_COMMUNITY): Payer: Self-pay

## 2023-12-14 ENCOUNTER — Other Ambulatory Visit: Payer: Self-pay

## 2023-12-15 DIAGNOSIS — G8928 Other chronic postprocedural pain: Secondary | ICD-10-CM | POA: Diagnosis not present

## 2023-12-15 DIAGNOSIS — I69398 Other sequelae of cerebral infarction: Secondary | ICD-10-CM | POA: Diagnosis not present

## 2023-12-19 ENCOUNTER — Other Ambulatory Visit: Payer: Self-pay

## 2023-12-21 ENCOUNTER — Other Ambulatory Visit: Payer: Self-pay

## 2023-12-21 NOTE — Progress Notes (Signed)
 Specialty Pharmacy Refill Coordination Note  Paula Martinez is a 53 y.o. female contacted today regarding refills of specialty medication(s) Bictegravir-Emtricitab-Tenofov (Biktarvy )   Patient requested Delivery   Delivery date: 12/22/23   Verified address: [address] [place] [address]   Medication will be filled on 12/21/23.

## 2023-12-25 ENCOUNTER — Ambulatory Visit: Payer: Self-pay | Admitting: Infectious Disease

## 2023-12-28 ENCOUNTER — Ambulatory Visit: Admitting: Family Medicine
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
## 2024-01-12 ENCOUNTER — Other Ambulatory Visit: Payer: Self-pay

## 2024-01-15 ENCOUNTER — Telehealth: Payer: Self-pay

## 2024-01-15 ENCOUNTER — Encounter: Payer: Self-pay | Admitting: Radiology

## 2024-01-15 NOTE — Telephone Encounter (Signed)
 Patient called asking if provider can put order in to [facility] for PT/ OT eval for new wheelchair. Does not have a PCP who can assist with this.  Order can be faxed to [phone] Lorenda CHRISTELLA Code, RMA

## 2024-01-16 ENCOUNTER — Telehealth: Payer: Self-pay | Admitting: Infectious Disease

## 2024-01-16 ENCOUNTER — Other Ambulatory Visit (HOSPITAL_COMMUNITY): Payer: Self-pay

## 2024-01-16 ENCOUNTER — Other Ambulatory Visit: Payer: Self-pay

## 2024-01-16 NOTE — Progress Notes (Signed)
 Specialty Pharmacy Refill Coordination Note  Paula Martinez is a 53 y.o. female contacted today regarding refills of specialty medication(s) Bictegravir-Emtricitab-Tenofov (Biktarvy )   Patient requested Delivery   Delivery date: 01/17/24   Verified address: [address] [place] [address]   Medication will be filled on: 01/18/24

## 2024-01-16 NOTE — Telephone Encounter (Signed)
 Islah requested to add additional information to the previous note. She stated the fax number for [facility] is [phone] and to add written order for PT/OT evaluation for wheelchair due to limited mobility. Pt will call back to schedule appt with pharmacy once she has received her wheelchair.

## 2024-01-17 ENCOUNTER — Ambulatory Visit: Admitting: Infectious Disease

## 2024-01-17 NOTE — Telephone Encounter (Signed)
 Order refaxed to [facility] as requested.

## 2024-01-19 NOTE — Telephone Encounter (Signed)
 Received fax from [facility] requesting updated order. Order is missing DOB. Will refax this and will need to send note.  F: [phone] Paula Martinez Code, RMA

## 2024-01-22 NOTE — Telephone Encounter (Signed)
 Recent office note faxed to [facility] at [phone]  Patient is requesting a wheelchair; staff called [facility] to clarify what the order needs are.  Staff requested [facility] to fax over a standard order form for [facility] provider to sign as they are requesting any equipment needs and accessories that may be needed with the wheelchair.  Provider can sign and fax back along with notes mentioning needs for wheelchair. Once form is received this needs to be placed in providers box and faxed back.   Enis Kleine, LPN

## 2024-01-22 NOTE — Telephone Encounter (Signed)
 Form received. Copy left in providers box along with previous order and note.  Will need to fax once provider returns to clinic. Lorenda CHRISTELLA Code, RMA

## 2024-01-23 ENCOUNTER — Emergency Department (HOSPITAL_COMMUNITY)

## 2024-01-23 ENCOUNTER — Inpatient Hospital Stay (HOSPITAL_COMMUNITY)
Admission: EM | Admit: 2024-01-23 | Discharge: 2024-01-30 | DRG: 976 | Disposition: A | Discharge status: Home / Self Care | Attending: Emergency Medicine | Admitting: Emergency Medicine

## 2024-01-23 ENCOUNTER — Other Ambulatory Visit: Payer: Self-pay

## 2024-01-23 DIAGNOSIS — J1529 Pneumonia due to other staphylococcus: Secondary | ICD-10-CM | POA: Diagnosis present

## 2024-01-23 DIAGNOSIS — F4310 Post-traumatic stress disorder, unspecified: Secondary | ICD-10-CM | POA: Diagnosis present

## 2024-01-23 DIAGNOSIS — B1920 Unspecified viral hepatitis C without hepatic coma: Secondary | ICD-10-CM | POA: Diagnosis present

## 2024-01-23 DIAGNOSIS — D573 Sickle-cell trait: Secondary | ICD-10-CM | POA: Diagnosis present

## 2024-01-23 DIAGNOSIS — Z79899 Other long term (current) drug therapy: Secondary | ICD-10-CM | POA: Diagnosis not present

## 2024-01-23 DIAGNOSIS — R509 Fever, unspecified: Secondary | ICD-10-CM | POA: Diagnosis not present

## 2024-01-23 DIAGNOSIS — Z87891 Personal history of nicotine dependence: Secondary | ICD-10-CM

## 2024-01-23 DIAGNOSIS — A419 Sepsis, unspecified organism: Secondary | ICD-10-CM | POA: Diagnosis not present

## 2024-01-23 DIAGNOSIS — F419 Anxiety disorder, unspecified: Secondary | ICD-10-CM | POA: Diagnosis present

## 2024-01-23 DIAGNOSIS — Z1152 Encounter for screening for COVID-19: Secondary | ICD-10-CM | POA: Diagnosis not present

## 2024-01-23 DIAGNOSIS — A411 Sepsis due to other specified staphylococcus: Secondary | ICD-10-CM | POA: Diagnosis present

## 2024-01-23 DIAGNOSIS — R0902 Hypoxemia: Principal | ICD-10-CM

## 2024-01-23 DIAGNOSIS — Z833 Family history of diabetes mellitus: Secondary | ICD-10-CM

## 2024-01-23 DIAGNOSIS — M545 Low back pain, unspecified: Secondary | ICD-10-CM | POA: Diagnosis present

## 2024-01-23 DIAGNOSIS — I69351 Hemiplegia and hemiparesis following cerebral infarction affecting right dominant side: Secondary | ICD-10-CM

## 2024-01-23 DIAGNOSIS — Z6831 Body mass index (BMI) 31.0-31.9, adult: Secondary | ICD-10-CM | POA: Diagnosis not present

## 2024-01-23 DIAGNOSIS — E66811 Obesity, class 1: Secondary | ICD-10-CM | POA: Diagnosis present

## 2024-01-23 DIAGNOSIS — E876 Hypokalemia: Secondary | ICD-10-CM | POA: Diagnosis present

## 2024-01-23 DIAGNOSIS — Z993 Dependence on wheelchair: Secondary | ICD-10-CM | POA: Diagnosis not present

## 2024-01-23 DIAGNOSIS — J189 Pneumonia, unspecified organism: Secondary | ICD-10-CM | POA: Diagnosis present

## 2024-01-23 DIAGNOSIS — B20 Human immunodeficiency virus [HIV] disease: Secondary | ICD-10-CM | POA: Diagnosis present

## 2024-01-23 DIAGNOSIS — M24531 Contracture, right wrist: Secondary | ICD-10-CM | POA: Diagnosis present

## 2024-01-23 DIAGNOSIS — Z885 Allergy status to narcotic agent status: Secondary | ICD-10-CM

## 2024-01-23 LAB — I-STAT CG4 LACTIC ACID, ED: Lactic Acid, Venous: 1.7 mmol/L (ref 0.5–1.9)

## 2024-01-23 LAB — URINALYSIS, W/ REFLEX TO CULTURE (INFECTION SUSPECTED)
Bilirubin Urine: NEGATIVE
Glucose, UA: >=500 mg/dL — AB
Ketones, ur: 5 mg/dL — AB
Leukocytes,Ua: NEGATIVE
Nitrite: NEGATIVE
Protein, ur: 100 mg/dL — AB
Specific Gravity, Urine: 1.022 (ref 1.005–1.030)
pH: 5 (ref 5.0–8.0)

## 2024-01-23 LAB — BASIC METABOLIC PANEL WITH GFR
Anion gap: 11 (ref 5–15)
BUN: 6 mg/dL (ref 6–20)
CO2: 16 mmol/L — ABNORMAL LOW (ref 22–32)
Calcium: 7.8 mg/dL — ABNORMAL LOW (ref 8.9–10.3)
Chloride: 108 mmol/L (ref 98–111)
Creatinine, Ser: 0.83 mg/dL (ref 0.44–1.00)
GFR, Estimated: >60 mL/min (ref >60)
Glucose, Bld: 170 mg/dL — ABNORMAL HIGH (ref 70–99)
Potassium: 3.9 mmol/L (ref 3.5–5.1)
Sodium: 135 mmol/L (ref 135–145)

## 2024-01-23 LAB — CBC WITH DIFFERENTIAL/PLATELET
Abs Immature Granulocytes: 0.07 K/uL (ref 0.00–0.07)
Basophils Absolute: 0 K/uL (ref 0.0–0.1)
Basophils Relative: 0 %
Eosinophils Absolute: 0 K/uL (ref 0.0–0.5)
Eosinophils Relative: 0 %
HCT: 38.1 % (ref 36.0–46.0)
Hemoglobin: 13.8 g/dL (ref 12.0–15.0)
Immature Granulocytes: 1 %
Lymphocytes Relative: 10 %
Lymphs Abs: 1.4 K/uL (ref 0.7–4.0)
MCH: 28.9 pg (ref 26.0–34.0)
MCHC: 36.2 g/dL — ABNORMAL HIGH (ref 30.0–36.0)
MCV: 79.7 fL — ABNORMAL LOW (ref 80.0–100.0)
Monocytes Absolute: 0.2 K/uL (ref 0.1–1.0)
Monocytes Relative: 2 %
Neutro Abs: 11.9 K/uL — ABNORMAL HIGH (ref 1.7–7.7)
Neutrophils Relative %: 87 %
Platelets: 210 K/uL (ref 150–400)
RBC: 4.78 MIL/uL (ref 3.87–5.11)
RDW: 13 % (ref 11.5–15.5)
WBC: 13.6 K/uL — ABNORMAL HIGH (ref 4.0–10.5)
nRBC: 0 % (ref 0.0–0.2)

## 2024-01-23 LAB — RESP PANEL BY RT-PCR (RSV, FLU A&B, COVID)  RVPGX2
Influenza A by PCR: NEGATIVE
Influenza B by PCR: NEGATIVE
Resp Syncytial Virus by PCR: NEGATIVE
SARS Coronavirus 2 by RT PCR: NEGATIVE

## 2024-01-23 LAB — GLUCOSE, CAPILLARY
Glucose-Capillary: 107 mg/dL — ABNORMAL HIGH (ref 70–99)
Glucose-Capillary: 193 mg/dL — ABNORMAL HIGH (ref 70–99)

## 2024-01-23 LAB — RESPIRATORY PANEL BY PCR

## 2024-01-23 LAB — COMPREHENSIVE METABOLIC PANEL WITH GFR
ALT: 19 U/L (ref 0–44)
AST: 19 U/L (ref 15–41)
Albumin: 2.8 g/dL — ABNORMAL LOW (ref 3.5–5.0)
Alkaline Phosphatase: 76 U/L (ref 38–126)
Anion gap: 15 (ref 5–15)
BUN: 6 mg/dL (ref 6–20)
CO2: 18 mmol/L — ABNORMAL LOW (ref 22–32)
Calcium: 8.4 mg/dL — ABNORMAL LOW (ref 8.9–10.3)
Chloride: 105 mmol/L (ref 98–111)
Creatinine, Ser: 0.83 mg/dL (ref 0.44–1.00)
GFR, Estimated: >60 mL/min (ref >60)
Glucose, Bld: 244 mg/dL — ABNORMAL HIGH (ref 70–99)
Potassium: 2.6 mmol/L — CL (ref 3.5–5.1)
Sodium: 138 mmol/L (ref 135–145)
Total Bilirubin: 1.5 mg/dL — ABNORMAL HIGH (ref 0.0–1.2)
Total Protein: 6.9 g/dL (ref 6.5–8.1)

## 2024-01-23 LAB — HEMOGLOBIN A1C
Hgb A1c MFr Bld: 5.2 % (ref 4.8–5.6)
Mean Plasma Glucose: 102.54 mg/dL

## 2024-01-23 LAB — RAPID URINE DRUG SCREEN, HOSP PERFORMED
Amphetamines: NOT DETECTED
Barbiturates: NOT DETECTED
Benzodiazepines: NOT DETECTED
Cocaine: POSITIVE — AB
Opiates: NOT DETECTED
Tetrahydrocannabinol: NOT DETECTED

## 2024-01-23 LAB — MAGNESIUM: Magnesium: 2 mg/dL (ref 1.7–2.4)

## 2024-01-23 LAB — T-HELPER CELLS (CD4) COUNT (NOT AT ARMC)
CD4 % Helper T Cell: 26 % — ABNORMAL LOW (ref 33–65)
CD4 T Cell Abs: 220 /uL — ABNORMAL LOW (ref 400–1790)

## 2024-01-23 LAB — CBG MONITORING, ED
Glucose-Capillary: 201 mg/dL — ABNORMAL HIGH (ref 70–99)
Glucose-Capillary: 162 mg/dL — ABNORMAL HIGH (ref 70–99)

## 2024-01-23 LAB — PROTIME-INR
INR: 1.2 (ref 0.8–1.2)
Prothrombin Time: 16.3 s — ABNORMAL HIGH (ref 11.4–15.2)

## 2024-01-23 LAB — PROCALCITONIN: Procalcitonin: 0.39 ng/mL

## 2024-01-23 MED ORDER — OXYCODONE HCL 5 MG PO TABS
5.0000 mg | ORAL_TABLET | ORAL | Status: DC | PRN
  Administered 2024-01-23 – 2024-01-30 (×17): 5 mg via ORAL
  Filled 2024-01-23 (×17): qty 1

## 2024-01-23 MED ORDER — ACETAMINOPHEN 325 MG PO TABS
650.0000 mg | ORAL_TABLET | Freq: Four times a day (QID) | ORAL | Status: DC | PRN
  Administered 2024-01-23 – 2024-01-27 (×8): 650 mg via ORAL
  Filled 2024-01-23 (×8): qty 2

## 2024-01-23 MED ORDER — LACTATED RINGERS IV SOLN: INTRAVENOUS | Status: DC

## 2024-01-23 MED ORDER — POTASSIUM CHLORIDE CRYS ER 20 MEQ PO TBCR
40.0000 meq | EXTENDED_RELEASE_TABLET | Freq: Three times a day (TID) | ORAL | Status: AC
  Administered 2024-01-23 (×3): 40 meq via ORAL
  Filled 2024-01-23 (×3): qty 2

## 2024-01-23 MED ORDER — SODIUM CHLORIDE 0.9 % IV BOLUS
1000.0000 mL | Freq: Once | INTRAVENOUS | Status: AC
  Administered 2024-01-23: 1000 mL via INTRAVENOUS

## 2024-01-23 MED ORDER — MAGNESIUM SULFATE 2 GM/50ML IV SOLN
2.0000 g | Freq: Once | INTRAVENOUS | Status: AC
  Administered 2024-01-23: 2 g via INTRAVENOUS
  Filled 2024-01-23: qty 50

## 2024-01-23 MED ORDER — BICTEGRAVIR-EMTRICITAB-TENOFOV 50-200-25 MG PO TABS
1.0000 | ORAL_TABLET | Freq: Every day | ORAL | Status: DC
  Administered 2024-01-23 – 2024-01-30 (×8): 1 via ORAL
  Filled 2024-01-23 (×8): qty 1

## 2024-01-23 MED ORDER — SODIUM CHLORIDE 0.9 % IV SOLN
500.0000 mg | Freq: Once | INTRAVENOUS | Status: AC
  Administered 2024-01-23: 500 mg via INTRAVENOUS
  Filled 2024-01-23: qty 5

## 2024-01-23 MED ORDER — INSULIN ASPART 100 UNIT/ML IJ SOLN
0.0000 [IU] | INTRAMUSCULAR | Status: DC
  Administered 2024-01-23: 5 [IU] via SUBCUTANEOUS
  Administered 2024-01-23: 3 [IU] via SUBCUTANEOUS
  Filled 2024-01-23: qty 3
  Filled 2024-01-23: qty 5

## 2024-01-23 MED ORDER — IPRATROPIUM-ALBUTEROL 0.5-2.5 (3) MG/3ML IN SOLN
3.0000 mL | Freq: Four times a day (QID) | RESPIRATORY_TRACT | Status: DC | PRN
  Administered 2024-01-24 – 2024-01-25 (×2): 3 mL via RESPIRATORY_TRACT
  Filled 2024-01-23 (×2): qty 3

## 2024-01-23 MED ORDER — SODIUM CHLORIDE 0.9 % IV SOLN
2.0000 g | INTRAVENOUS | Status: DC
  Administered 2024-01-24 – 2024-01-25 (×2): 2 g via INTRAVENOUS
  Filled 2024-01-23 (×2): qty 20

## 2024-01-23 MED ORDER — KETOROLAC TROMETHAMINE 15 MG/ML IJ SOLN
15.0000 mg | Freq: Once | INTRAMUSCULAR | Status: AC
  Administered 2024-01-23: 15 mg via INTRAVENOUS
  Filled 2024-01-23: qty 1

## 2024-01-23 MED ORDER — ACETAMINOPHEN 325 MG PO TABS
650.0000 mg | ORAL_TABLET | Freq: Once | ORAL | Status: AC
  Administered 2024-01-23: 650 mg via ORAL
  Filled 2024-01-23: qty 2

## 2024-01-23 MED ORDER — POTASSIUM CHLORIDE CRYS ER 20 MEQ PO TBCR
40.0000 meq | EXTENDED_RELEASE_TABLET | Freq: Once | ORAL | Status: AC
  Administered 2024-01-23: 40 meq via ORAL
  Filled 2024-01-23: qty 2

## 2024-01-23 MED ORDER — ENOXAPARIN SODIUM 40 MG/0.4ML IJ SOSY
40.0000 mg | PREFILLED_SYRINGE | INTRAMUSCULAR | Status: DC
  Administered 2024-01-23 – 2024-01-29 (×7): 40 mg via SUBCUTANEOUS
  Filled 2024-01-23 (×7): qty 0.4

## 2024-01-23 MED ORDER — SODIUM CHLORIDE 0.9 % IV SOLN
2.0000 g | Freq: Once | INTRAVENOUS | Status: AC
  Administered 2024-01-23: 2 g via INTRAVENOUS
  Filled 2024-01-23: qty 20

## 2024-01-23 MED ORDER — AZITHROMYCIN 500 MG PO TABS
500.0000 mg | ORAL_TABLET | Freq: Every day | ORAL | Status: AC
  Administered 2024-01-24 – 2024-01-25 (×2): 500 mg via ORAL
  Filled 2024-01-23 (×2): qty 1

## 2024-01-23 MED ORDER — HYDROXYZINE HCL 25 MG PO TABS
25.0000 mg | ORAL_TABLET | Freq: Three times a day (TID) | ORAL | Status: DC | PRN
Start: 2024-01-23 — End: 2024-01-30
  Administered 2024-01-23 – 2024-01-30 (×14): 25 mg via ORAL
  Filled 2024-01-23 (×14): qty 1

## 2024-01-23 MED ORDER — INSULIN ASPART 100 UNIT/ML IJ SOLN
0.0000 [IU] | Freq: Three times a day (TID) | INTRAMUSCULAR | Status: DC
  Administered 2024-01-23: 3 [IU] via SUBCUTANEOUS
  Administered 2024-01-24: 5 [IU] via SUBCUTANEOUS
  Administered 2024-01-24: 3 [IU] via SUBCUTANEOUS
  Administered 2024-01-26 (×2): 2 [IU] via SUBCUTANEOUS
  Administered 2024-01-27: 3 [IU] via SUBCUTANEOUS
  Administered 2024-01-28 – 2024-01-29 (×2): 2 [IU] via SUBCUTANEOUS
  Filled 2024-01-23: qty 1
  Filled 2024-01-23: qty 2
  Filled 2024-01-23: qty 1
  Filled 2024-01-23: qty 3
  Filled 2024-01-23 (×3): qty 2
  Filled 2024-01-23: qty 3

## 2024-01-23 MED ORDER — ACETAMINOPHEN 500 MG PO TABS: 1000.0000 mg | ORAL_TABLET | Freq: Once | ORAL | Status: DC

## 2024-01-23 MED ORDER — POTASSIUM CHLORIDE 10 MEQ/100ML IV SOLN
10.0000 meq | INTRAVENOUS | Status: AC
  Administered 2024-01-23 (×3): 10 meq via INTRAVENOUS
  Filled 2024-01-23 (×3): qty 100

## 2024-01-23 MED ORDER — IBUPROFEN 400 MG PO TABS
400.0000 mg | ORAL_TABLET | Freq: Four times a day (QID) | ORAL | Status: DC | PRN
  Administered 2024-01-23 – 2024-01-26 (×3): 400 mg via ORAL
  Filled 2024-01-23 (×3): qty 1

## 2024-01-23 NOTE — Sepsis Progress Note (Signed)
 Sepsis protocol monitored by eLink

## 2024-01-23 NOTE — ED Notes (Signed)
 X-ray at bedside.

## 2024-01-23 NOTE — ED Notes (Addendum)
 Pt found to be febrile and hypotensive. This paramedic came in room and asked pt to lay on back which she states she can't. She will not rolll over onto her back. BP now reading 152/83. Pt states she is nauseous. MD Georgina raker and paged for tylenol  and nausea medication.

## 2024-01-23 NOTE — Care Management (Signed)
 Patient arrived to the unit AAO X4 Complaining of pain 10/10 in the leg and lower back No other signs/symptoms of distress Skin assessment done with [place], RN  Patient febrile at 102, placed ice packs under arms and on groin  Sonny, RN

## 2024-01-23 NOTE — ED Triage Notes (Signed)
 Pt bib ems from home c/o respiratory distress and fever. EMS noted wheezing in all fields. Polyuria and incontinence. Generalized pain throughout body.  Hx Sepsis (UTI) CVA (right side deficits)  BP 170/90 HR 120 Capno. 18 RR 30-40   500 cc NS 650 mg Tylenol  PO 4 mg Zofran 

## 2024-01-23 NOTE — ED Notes (Signed)
 Breakfast tray ordered

## 2024-01-23 NOTE — Telephone Encounter (Signed)
 From was signed by Melvenia Np. Will fax order along with notes.  Lorenda CHRISTELLA Code, RMA

## 2024-01-23 NOTE — ED Notes (Signed)
 CCMD called, pt on monitor

## 2024-01-23 NOTE — Plan of Care (Signed)

## 2024-01-23 NOTE — Progress Notes (Signed)
 Received the called from CCMD- pt having 30 beats of VT. Pt sleeping without any discomfort. Made MD Mansy aware. Received the order for lab work( magnesium ).   Will continue the ongoing plan of care.

## 2024-01-23 NOTE — ED Provider Notes (Signed)
 Patient had it off to me at 7 AM.  Here with fever tachycardia and new oxygen requirement.  HIV history but last HIV count and CD4 count unremarkable.  Has been compliant with her meds.  Viral type symptoms the last few days now worsening cough shortness of breath.  Little bit of dysuria as well.  Does look like she has a pneumonia on her chest x-ray.  Broad-spectrum IV antibiotics have been started IV fluids antipyretics given.  Hemodynamics have improved.  Ultimately will admit her for further sepsis care.  She does have mild leukocytosis but no lactic acidosis.  She stable for admission to the floor.  Will defer steroids further management per inpatient team.  I have sent off new HIV and CD4 count.  This chart was dictated using voice recognition software.  Despite best efforts to proofread,  errors can occur which can change the documentation meaning.   .Critical Care  Performed by: Ruthe Cornet, DO Authorized by: Ruthe Cornet, DO   Critical care provider statement:    Critical care time (minutes):  35   Critical care was necessary to treat or prevent imminent or life-threatening deterioration of the following conditions:  Sepsis and respiratory failure   Critical care was time spent personally by me on the following activities:  Blood draw for specimens, development of treatment plan with patient or surrogate, discussions with primary provider, evaluation of patient's response to treatment, examination of patient, obtaining history from patient or surrogate, ordering and performing treatments and interventions, ordering and review of laboratory studies, re-evaluation of patient's condition, review of old charts, pulse oximetry and ordering and review of radiographic studies   I assumed direction of critical care for this patient from another provider in my specialty: yes     Care discussed with: admitting provider       Ruthe Cornet, DO 01/23/24 9195

## 2024-01-23 NOTE — ED Notes (Signed)
 Paged w georgina [phone] to [place] [phone]
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
## 2024-01-24 DIAGNOSIS — A419 Sepsis, unspecified organism: Secondary | ICD-10-CM

## 2024-01-24 DIAGNOSIS — J189 Pneumonia, unspecified organism: Secondary | ICD-10-CM | POA: Diagnosis not present

## 2024-01-24 LAB — COMPREHENSIVE METABOLIC PANEL WITH GFR
ALT: 19 U/L (ref 0–44)
AST: 21 U/L (ref 15–41)
Albumin: 2.4 g/dL — ABNORMAL LOW (ref 3.5–5.0)
Alkaline Phosphatase: 63 U/L (ref 38–126)
Anion gap: 10 (ref 5–15)
BUN: 6 mg/dL (ref 6–20)
CO2: 17 mmol/L — ABNORMAL LOW (ref 22–32)
Calcium: 8.3 mg/dL — ABNORMAL LOW (ref 8.9–10.3)
Chloride: 109 mmol/L (ref 98–111)
Creatinine, Ser: 0.77 mg/dL (ref 0.44–1.00)
GFR, Estimated: >60 mL/min (ref >60)
Glucose, Bld: 184 mg/dL — ABNORMAL HIGH (ref 70–99)
Potassium: 3.9 mmol/L (ref 3.5–5.1)
Sodium: 136 mmol/L (ref 135–145)
Total Bilirubin: 1.1 mg/dL (ref 0.0–1.2)
Total Protein: 6.2 g/dL — ABNORMAL LOW (ref 6.5–8.1)

## 2024-01-24 LAB — BLOOD CULTURE ID PANEL (REFLEXED) - BCID2

## 2024-01-24 LAB — RESPIRATORY PANEL BY PCR

## 2024-01-24 LAB — CBC
HCT: 36.4 % (ref 36.0–46.0)
Hemoglobin: 12.8 g/dL (ref 12.0–15.0)
MCH: 28.1 pg (ref 26.0–34.0)
MCHC: 35.2 g/dL (ref 30.0–36.0)
MCV: 79.8 fL — ABNORMAL LOW (ref 80.0–100.0)
Platelets: 223 K/uL (ref 150–400)
RBC: 4.56 MIL/uL (ref 3.87–5.11)
RDW: 13.2 % (ref 11.5–15.5)
WBC: 10.4 K/uL (ref 4.0–10.5)
nRBC: 0 % (ref 0.0–0.2)

## 2024-01-24 LAB — HIV-1 RNA QUANT-NO REFLEX-BLD
HIV 1 RNA Quant: <20 {copies}/mL
LOG10 HIV-1 RNA: UNDETERMINED {Log_copies}/mL

## 2024-01-24 LAB — GLUCOSE, CAPILLARY
Glucose-Capillary: 223 mg/dL — ABNORMAL HIGH (ref 70–99)
Glucose-Capillary: 191 mg/dL — ABNORMAL HIGH (ref 70–99)
Glucose-Capillary: 82 mg/dL (ref 70–99)
Glucose-Capillary: 90 mg/dL (ref 70–99)

## 2024-01-24 MED ORDER — SODIUM CHLORIDE 0.9 % IV SOLN: INTRAVENOUS | Status: AC

## 2024-01-24 MED ORDER — ONDANSETRON HCL 4 MG/2ML IJ SOLN
4.0000 mg | INTRAMUSCULAR | Status: DC | PRN
  Administered 2024-01-24: 4 mg via INTRAVENOUS
  Filled 2024-01-24: qty 2

## 2024-01-24 NOTE — Progress Notes (Signed)
 PROGRESS NOTE        PATIENT DETAILS Name: Paula Martinez Age: 53 y.o. Sex: female Date of Birth: 22-Jan-1971 Admit Date: 01/23/2024 Admitting Physician Marsha Ada, MD PCP:Sun, Vyvyan, MD  Brief Summary: Patient is a 53 y.o.  female with history of HIV-on ART, prior PML-s/p right hand/wrist contracture-who presented with 2-3-day history of fever-myalgias-low back pain-dry cough-found to have PNA and subsequently admitted to the hospitalist service  Significant events: 11/11>> admit to [facility]  Significant studies: 11/11>> CXR: Left> right base opacity  Significant microbiology data: 11/11>> COVID/influenza/RSV PCR: Negative 11/11>> blood culture 1/2: Gram-positive cocci (likely a contaminant per BCID)  Procedures: None  Consults: None  Subjective: Fever overnight-appears stable this morning-continues to have dry cough.  Continues to complain of diffuse myalgias.  Objective: Vitals: Blood pressure 116/74, pulse 96, temperature 99.8 F (37.7 C), temperature source Oral, resp. rate 18, height 5' 7 (1.702 m), weight 90.7 kg, SpO2 92%.   Exam: Gen Exam:Alert awake-not in any distress HEENT:atraumatic, normocephalic Chest: B/L clear to auscultation anteriorly CVS:S1S2 regular Abdomen:soft non tender, non distended Extremities:no edema Neurology: Non focal Skin: no rash  Pertinent Labs/Radiology:    Latest Ref Rng & Units 01/24/2024    8:01 AM 01/23/2024    6:53 AM 11/20/2023    1:59 PM  CBC  WBC 4.0 - 10.5 K/uL 10.4  13.6  6.2   Hemoglobin 12.0 - 15.0 g/dL 87.1  86.1  84.6   Hematocrit 36.0 - 46.0 % 36.4  38.1  46.0   Platelets 150 - 400 K/uL 223  210  280     Lab Results  Component Value Date   NA 136 01/24/2024   K 3.9 01/24/2024   CL 109 01/24/2024   CO2 17 (L) 01/24/2024      Assessment/Plan: Sepsis secondary to PNA (POA) Sepsis physiology gradually improving-febrile but BP currently stable Continue IV  Rocephin/Zithromax  Blood culture results likely consistent with contamination-repeat today Will send out respiratory virus panel Watch closely-if does not defervesce in the next 24 hours-May need repeat imaging/more workup.  HIV Continue ART  History of PML with right wrist contracture Follows with outpatient hand surgery/orthopedic surgery  Class 1 Obesity: Estimated body mass index is 31.32 kg/m as calculated from the following:   Height as of this encounter: 5' 7 (1.702 m).   Weight as of this encounter: 90.7 kg.   Code status:   Code Status: Full Code   DVT Prophylaxis: enoxaparin (LOVENOX) injection 40 mg Start: 01/23/24 1800   Family Communication: None at bedside   Disposition Plan: Status is: Inpatient Remains inpatient appropriate because: Severity of illness   Planned Discharge Destination:Home   Diet: Diet Order             Diet regular Room service appropriate? Yes; Fluid consistency: Thin  Diet effective now                     Antimicrobial agents: Anti-infectives (From admission, onward)    Start     Dose/Rate Route Frequency Ordered Stop   01/24/24 1000  cefTRIAXone (ROCEPHIN) 2 g in sodium chloride  0.9 % 100 mL IVPB        2 g 200 mL/hr over 30 Minutes Intravenous Every 24 hours 01/23/24 1202     01/24/24 1000  azithromycin  (ZITHROMAX ) tablet 500 mg  500 mg Oral Daily 01/23/24 1202 01/26/24 0959   01/23/24 1230  bictegravir-emtricitabine -tenofovir  AF (BIKTARVY ) 50-200-25 MG per tablet 1 tablet        1 tablet Oral Daily 01/23/24 1220     01/23/24 0745  azithromycin  (ZITHROMAX ) 500 mg in sodium chloride  0.9 % 250 mL IVPB        500 mg 250 mL/hr over 60 Minutes Intravenous  Once 01/23/24 0737 01/23/24 0924   01/23/24 0700  cefTRIAXone (ROCEPHIN) 2 g in sodium chloride  0.9 % 100 mL IVPB        2 g 200 mL/hr over 30 Minutes Intravenous  Once 01/23/24 0645 01/23/24 0737        MEDICATIONS: Scheduled Meds:  azithromycin   500 mg  Oral Daily   bictegravir-emtricitabine -tenofovir  AF  1 tablet Oral Daily   enoxaparin (LOVENOX) injection  40 mg Subcutaneous Q24H   insulin aspart  0-15 Units Subcutaneous TID AC & HS   Continuous Infusions:  cefTRIAXone (ROCEPHIN)  IV 2 g (01/24/24 0912)   PRN Meds:.acetaminophen , hydrOXYzine , ibuprofen, ipratropium-albuterol , ondansetron  (ZOFRAN ) IV, oxyCODONE    I have personally reviewed following labs and imaging studies  LABORATORY DATA: CBC: Recent Labs  Lab 01/23/24 0653 01/24/24 0801  WBC 13.6* 10.4  NEUTROABS 11.9*  --   HGB 13.8 12.8  HCT 38.1 36.4  MCV 79.7* 79.8*  PLT 210 223    Basic Metabolic Panel: Recent Labs  Lab 01/23/24 0653 01/23/24 1333 01/23/24 2308 01/24/24 0801  NA 138 135  --  136  K 2.6* 3.9  --  3.9  CL 105 108  --  109  CO2 18* 16*  --  17*  GLUCOSE 244* 170*  --  184*  BUN 6 6  --  6  CREATININE 0.83 0.83  --  0.77  CALCIUM  8.4* 7.8*  --  8.3*  MG  --   --  2.0  --     GFR: Estimated Creatinine Clearance: 94 mL/min (by C-G formula based on SCr of 0.77 mg/dL).  Liver Function Tests: Recent Labs  Lab 01/23/24 0653 01/24/24 0801  AST 19 21  ALT 19 19  ALKPHOS 76 63  BILITOT 1.5* 1.1  PROT 6.9 6.2*  ALBUMIN 2.8* 2.4*   No results for input(s): LIPASE, AMYLASE in the last 168 hours. No results for input(s): AMMONIA in the last 168 hours.  Coagulation Profile: Recent Labs  Lab 01/23/24 0653  INR 1.2    Cardiac Enzymes: No results for input(s): CKTOTAL, CKMB, CKMBINDEX, TROPONINI in the last 168 hours.  BNP (last 3 results) No results for input(s): PROBNP in the last 8760 hours.  Lipid Profile: No results for input(s): CHOL, HDL, LDLCALC, TRIG, CHOLHDL, LDLDIRECT in the last 72 hours.  Thyroid Function Tests: No results for input(s): TSH, T4TOTAL, FREET4, T3FREE, THYROIDAB in the last 72 hours.  Anemia Panel: No results for input(s): VITAMINB12, FOLATE, FERRITIN,  TIBC, IRON, RETICCTPCT in the last 72 hours.  Urine analysis:    Component Value Date/Time   COLORURINE AMBER (A) 01/23/2024 0652   APPEARANCEUR HAZY (A) 01/23/2024 0652   LABSPEC 1.022 01/23/2024 0652   PHURINE 5.0 01/23/2024 0652   GLUCOSEU >=500 (A) 01/23/2024 0652   HGBUR MODERATE (A) 01/23/2024 0652   BILIRUBINUR NEGATIVE 01/23/2024 0652   KETONESUR 5 (A) 01/23/2024 0652   PROTEINUR 100 (A) 01/23/2024 0652   NITRITE NEGATIVE 01/23/2024 0652   LEUKOCYTESUR NEGATIVE 01/23/2024 0652    Sepsis Labs: Lactic Acid, Venous    Component Value  Date/Time   LATICACIDVEN 1.7 01/23/2024 0658    MICROBIOLOGY: Recent Results (from the past 240 hours)  Blood Culture (routine x 2)     Status: None (Preliminary result)   Collection Time: 01/23/24  6:25 AM   Specimen: BLOOD LEFT WRIST  Result Value Ref Range Status   Specimen Description BLOOD LEFT WRIST  Final   Special Requests   Final    BOTTLES DRAWN AEROBIC ONLY Blood Culture results may not be optimal due to an inadequate volume of blood received in culture bottles   Culture   Final    NO GROWTH < 24 HOURS Performed at [facility], [address], [place], [place] [address]    Report Status PENDING  Incomplete  Resp panel by RT-PCR (RSV, Flu A&B, Covid)     Status: None   Collection Time: 01/23/24  6:53 AM   Specimen: Nasal Swab  Result Value Ref Range Status   SARS Coronavirus 2 by RT PCR NEGATIVE NEGATIVE Final   Influenza A by PCR NEGATIVE NEGATIVE Final   Influenza B by PCR NEGATIVE NEGATIVE Final    Comment: (NOTE) The Xpert Xpress SARS-CoV-2/FLU/RSV plus assay is intended as an aid in the diagnosis of influenza from Nasopharyngeal swab specimens and should not be used as a sole basis for treatment. Nasal washings and aspirates are unacceptable for Xpert Xpress SARS-CoV-2/FLU/RSV testing.  Fact Sheet for Patients: bloggercourse.com  Fact Sheet for Healthcare  Providers: seriousbroker.it  This test is not yet approved or cleared by the [place]  FDA and has been authorized for detection and/or diagnosis of SARS-CoV-2 by FDA under an Emergency Use Authorization (EUA). This EUA will remain in effect (meaning this test can be used) for the duration of the COVID-19 declaration under Section 564(b)(1) of the Act, 21 U.S.C. section 360bbb-3(b)(1), unless the authorization is terminated or revoked.     Resp Syncytial Virus by PCR NEGATIVE NEGATIVE Final    Comment: (NOTE) Fact Sheet for Patients: bloggercourse.com  Fact Sheet for Healthcare Providers: seriousbroker.it  This test is not yet approved or cleared by the [place]  FDA and has been authorized for detection and/or diagnosis of SARS-CoV-2 by FDA under an Emergency Use Authorization (EUA). This EUA will remain in effect (meaning this test can be used) for the duration of the COVID-19 declaration under Section 564(b)(1) of the Act, 21 U.S.C. section 360bbb-3(b)(1), unless the authorization is terminated or revoked.  Performed at [facility], [address], [place], [place] [address]   Blood Culture (routine x 2)     Status: None (Preliminary result)   Collection Time: 01/23/24  6:53 AM   Specimen: BLOOD  Result Value Ref Range Status   Specimen Description BLOOD LEFT ANTECUBITAL  Final   Special Requests   Final    BOTTLES DRAWN AEROBIC AND ANAEROBIC Blood Culture results may not be optimal due to an inadequate volume of blood received in culture bottles   Culture  Setup Time   Final    GRAM POSITIVE COCCI IN CLUSTERS AEROBIC BOTTLE ONLY CRITICAL RESULT CALLED TO, READ BACK BY AND VERIFIED WITH: PHARMD G ABBOTT 01/24/2024 @ 0343 BY AB Performed at [facility], [address], [place], [place] [address]    Culture GRAM POSITIVE COCCI  Final   Report Status PENDING  Incomplete   Respiratory (~20 pathogens) panel by PCR     Status: None   Collection Time: 01/23/24  6:53 AM   Specimen: Nasopharyngeal Swab;  Respiratory  Result Value Ref Range Status   Adenovirus NOT DETECTED NOT DETECTED Final   Coronavirus 229E NOT DETECTED NOT DETECTED Final    Comment: (NOTE) The Coronavirus on the Respiratory Panel, DOES NOT test for the novel  Coronavirus (2019 nCoV)    Coronavirus HKU1 NOT DETECTED NOT DETECTED Final   Coronavirus NL63 NOT DETECTED NOT DETECTED Final   Coronavirus OC43 NOT DETECTED NOT DETECTED Final   Metapneumovirus NOT DETECTED NOT DETECTED Final   Rhinovirus / Enterovirus NOT DETECTED NOT DETECTED Final   Influenza A NOT DETECTED NOT DETECTED Final   Influenza B NOT DETECTED NOT DETECTED Final   Parainfluenza Virus 1 NOT DETECTED NOT DETECTED Final   Parainfluenza Virus 2 NOT DETECTED NOT DETECTED Final   Parainfluenza Virus 3 NOT DETECTED NOT DETECTED Final   Parainfluenza Virus 4 NOT DETECTED NOT DETECTED Final   Respiratory Syncytial Virus NOT DETECTED NOT DETECTED Final   Bordetella pertussis NOT DETECTED NOT DETECTED Final   Bordetella Parapertussis NOT DETECTED NOT DETECTED Final   Chlamydophila pneumoniae NOT DETECTED NOT DETECTED Final   Mycoplasma pneumoniae NOT DETECTED NOT DETECTED Final    Comment: Performed at [facility], [address], [place], [place] [address]  Blood Culture ID Panel (Reflexed)     Status: Abnormal   Collection Time: 01/23/24  6:53 AM  Result Value Ref Range Status   Enterococcus faecalis NOT DETECTED NOT DETECTED Final   Enterococcus Faecium NOT DETECTED NOT DETECTED Final   Listeria monocytogenes NOT DETECTED NOT DETECTED Final   Staphylococcus species DETECTED (A) NOT DETECTED Final    Comment: CRITICAL RESULT CALLED TO, READ BACK BY AND VERIFIED WITH: PHARMD G ABBOTT 01/24/2024 @ 0343 BY AB    Staphylococcus aureus (BCID) NOT DETECTED NOT DETECTED Final   Staphylococcus epidermidis NOT DETECTED NOT  DETECTED Final   Staphylococcus lugdunensis NOT DETECTED NOT DETECTED Final   Streptococcus species NOT DETECTED NOT DETECTED Final   Streptococcus agalactiae NOT DETECTED NOT DETECTED Final   Streptococcus pneumoniae NOT DETECTED NOT DETECTED Final   Streptococcus pyogenes NOT DETECTED NOT DETECTED Final   A.calcoaceticus-baumannii NOT DETECTED NOT DETECTED Final   Bacteroides fragilis NOT DETECTED NOT DETECTED Final   Enterobacterales NOT DETECTED NOT DETECTED Final   Enterobacter cloacae complex NOT DETECTED NOT DETECTED Final   Escherichia coli NOT DETECTED NOT DETECTED Final   Klebsiella aerogenes NOT DETECTED NOT DETECTED Final   Klebsiella oxytoca NOT DETECTED NOT DETECTED Final   Klebsiella pneumoniae NOT DETECTED NOT DETECTED Final   Proteus species NOT DETECTED NOT DETECTED Final   Salmonella species NOT DETECTED NOT DETECTED Final   Serratia marcescens NOT DETECTED NOT DETECTED Final   Haemophilus influenzae NOT DETECTED NOT DETECTED Final   Neisseria meningitidis NOT DETECTED NOT DETECTED Final   Pseudomonas aeruginosa NOT DETECTED NOT DETECTED Final   Stenotrophomonas maltophilia NOT DETECTED NOT DETECTED Final   Candida albicans NOT DETECTED NOT DETECTED Final   Candida auris NOT DETECTED NOT DETECTED Final   Candida glabrata NOT DETECTED NOT DETECTED Final   Candida krusei NOT DETECTED NOT DETECTED Final   Candida parapsilosis NOT DETECTED NOT DETECTED Final   Candida tropicalis NOT DETECTED NOT DETECTED Final   Cryptococcus neoformans/gattii NOT DETECTED NOT DETECTED Final    Comment: Performed at [facility], [address] [place], [place] [address]    RADIOLOGY STUDIES/RESULTS: DG Chest Port 1 View Result Date: 01/23/2024 EXAM: 1 VIEW(S) XRAY OF THE CHEST 01/23/2024 07:06:14 AM COMPARISON:  12/30/2022 CLINICAL HISTORY: Respiratory distress and fever, sepsis - evaluate for abnormality FINDINGS: LUNGS AND PLEURA: Patchy left lung base opacity. Less pronounced  streaky new right infrahilar opacity. Streaky left greater than right lung base opacity suspicious for developing bronchopneumonia / pneumonia. No consolidation. No pulmonary edema. No pleural effusion. No pneumothorax. HEART AND MEDIASTINUM: No acute abnormality of the cardiac and mediastinal silhouettes. BONES AND SOFT TISSUES: No acute osseous abnormality. IMPRESSION: 1. Streaky left greater than right lung base opacity suspicious for developing bronchopneumonia or pneumonia. No pleural effusion. Electronically signed by: Helayne Hurst MD 01/23/2024 07:13 AM EST RP Workstation: [id number]     LOS: 1 day   Donalda Applebaum, MD  [facility]    To contact the attending provider between 7A-7P or the covering provider during after hours 7P-7A, please log into the web site [email or web address] and access using universal [place] password for that web site. If you do not have the password, please call the hospital operator.  01/24/2024, 9:44 AM

## 2024-01-24 NOTE — Plan of Care (Signed)
  Problem: Health Behavior/Discharge Planning: Goal: Ability to manage health-related needs will improve Outcome: Progressing   Problem: Clinical Measurements: Goal: Will remain free from infection Outcome: Progressing Goal: Diagnostic test results will improve Outcome: Progressing Goal: Respiratory complications will improve Outcome: Progressing   Problem: Activity: Goal: Risk for activity intolerance will decrease Outcome: Progressing   Problem: Coping: Goal: Level of anxiety will decrease Outcome: Progressing   Problem: Pain Managment: Goal: General experience of comfort will improve and/or be controlled Outcome: Progressing

## 2024-01-24 NOTE — Progress Notes (Signed)
 PHARMACY - PHYSICIAN COMMUNICATION CRITICAL VALUE ALERT - BLOOD CULTURE IDENTIFICATION (BCID)  [name] is an 53 y.o. female who presented to [facility] on 01/23/2024 with a chief complaint of SOB/PNA  Assessment:  1/2 blood cultures growing Staph species, likely contaminant  Name of physician (or Provider) Contacted:  Dr . Lawence  Current antibiotics: Rocephin and Azithromycin   Changes to prescribed antibiotics recommended:  No changes at this time  Results for orders placed or performed during the hospital encounter of 01/23/24  Blood Culture ID Panel (Reflexed) (Collected: 01/23/2024  6:53 AM)  Result Value Ref Range   Enterococcus faecalis NOT DETECTED NOT DETECTED   Enterococcus Faecium NOT DETECTED NOT DETECTED   Listeria monocytogenes NOT DETECTED NOT DETECTED   Staphylococcus species DETECTED (A) NOT DETECTED   Staphylococcus aureus (BCID) NOT DETECTED NOT DETECTED   Staphylococcus epidermidis NOT DETECTED NOT DETECTED   Staphylococcus lugdunensis NOT DETECTED NOT DETECTED   Streptococcus species NOT DETECTED NOT DETECTED   Streptococcus agalactiae NOT DETECTED NOT DETECTED   Streptococcus pneumoniae NOT DETECTED NOT DETECTED   Streptococcus pyogenes NOT DETECTED NOT DETECTED   A.calcoaceticus-baumannii NOT DETECTED NOT DETECTED   Bacteroides fragilis NOT DETECTED NOT DETECTED   Enterobacterales NOT DETECTED NOT DETECTED   Enterobacter cloacae complex NOT DETECTED NOT DETECTED   Escherichia coli NOT DETECTED NOT DETECTED   Klebsiella aerogenes NOT DETECTED NOT DETECTED   Klebsiella oxytoca NOT DETECTED NOT DETECTED   Klebsiella pneumoniae NOT DETECTED NOT DETECTED   Proteus species NOT DETECTED NOT DETECTED   Salmonella species NOT DETECTED NOT DETECTED   Serratia marcescens NOT DETECTED NOT DETECTED   Haemophilus influenzae NOT DETECTED NOT DETECTED   Neisseria meningitidis NOT DETECTED NOT DETECTED   Pseudomonas aeruginosa NOT DETECTED NOT DETECTED    Stenotrophomonas maltophilia NOT DETECTED NOT DETECTED   Candida albicans NOT DETECTED NOT DETECTED   Candida auris NOT DETECTED NOT DETECTED   Candida glabrata NOT DETECTED NOT DETECTED   Candida krusei NOT DETECTED NOT DETECTED   Candida parapsilosis NOT DETECTED NOT DETECTED   Candida tropicalis NOT DETECTED NOT DETECTED   Cryptococcus neoformans/gattii NOT DETECTED NOT DETECTED    Dail Cordella Misty 01/24/2024  3:45 AM

## 2024-01-25 ENCOUNTER — Inpatient Hospital Stay (HOSPITAL_COMMUNITY)

## 2024-01-25 DIAGNOSIS — A419 Sepsis, unspecified organism: Secondary | ICD-10-CM | POA: Diagnosis not present

## 2024-01-25 DIAGNOSIS — J189 Pneumonia, unspecified organism: Secondary | ICD-10-CM | POA: Diagnosis not present

## 2024-01-25 LAB — CBC
HCT: 32.9 % — ABNORMAL LOW (ref 36.0–46.0)
Hemoglobin: 11.6 g/dL — ABNORMAL LOW (ref 12.0–15.0)
MCH: 28.2 pg (ref 26.0–34.0)
MCHC: 35.3 g/dL (ref 30.0–36.0)
MCV: 79.9 fL — ABNORMAL LOW (ref 80.0–100.0)
Platelets: 199 K/uL (ref 150–400)
RBC: 4.12 MIL/uL (ref 3.87–5.11)
RDW: 13.5 % (ref 11.5–15.5)
WBC: 9.2 K/uL (ref 4.0–10.5)
nRBC: 0 % (ref 0.0–0.2)

## 2024-01-25 LAB — C-REACTIVE PROTEIN: CRP: 29.1 mg/dL — ABNORMAL HIGH (ref <1.0)

## 2024-01-25 LAB — RAPID URINE DRUG SCREEN, HOSP PERFORMED
Amphetamines: NOT DETECTED
Barbiturates: NOT DETECTED
Benzodiazepines: NOT DETECTED
Cocaine: NOT DETECTED
Opiates: NOT DETECTED
Tetrahydrocannabinol: NOT DETECTED

## 2024-01-25 LAB — BASIC METABOLIC PANEL WITH GFR
Anion gap: 10 (ref 5–15)
BUN: 8 mg/dL (ref 6–20)
CO2: 16 mmol/L — ABNORMAL LOW (ref 22–32)
Calcium: 8.1 mg/dL — ABNORMAL LOW (ref 8.9–10.3)
Chloride: 109 mmol/L (ref 98–111)
Creatinine, Ser: 0.88 mg/dL (ref 0.44–1.00)
GFR, Estimated: >60 mL/min (ref >60)
Glucose, Bld: 98 mg/dL (ref 70–99)
Potassium: 3.9 mmol/L (ref 3.5–5.1)
Sodium: 135 mmol/L (ref 135–145)

## 2024-01-25 LAB — GLUCOSE, CAPILLARY
Glucose-Capillary: 134 mg/dL — ABNORMAL HIGH (ref 70–99)
Glucose-Capillary: 100 mg/dL — ABNORMAL HIGH (ref 70–99)
Glucose-Capillary: 93 mg/dL (ref 70–99)
Glucose-Capillary: 91 mg/dL (ref 70–99)
Glucose-Capillary: 108 mg/dL — ABNORMAL HIGH (ref 70–99)
Glucose-Capillary: 105 mg/dL — ABNORMAL HIGH (ref 70–99)

## 2024-01-25 LAB — SEDIMENTATION RATE: Sed Rate: 80 mm/h — ABNORMAL HIGH (ref 0–22)

## 2024-01-25 LAB — EXPECTORATED SPUTUM ASSESSMENT W GRAM STAIN, RFLX TO RESP C

## 2024-01-25 LAB — MRSA NEXT GEN BY PCR, NASAL: MRSA by PCR Next Gen: NOT DETECTED

## 2024-01-25 LAB — STREP PNEUMONIAE URINARY ANTIGEN: Strep Pneumo Urinary Antigen: NEGATIVE

## 2024-01-25 MED ORDER — VANCOMYCIN HCL 1500 MG/300ML IV SOLN
1500.0000 mg | Freq: Once | INTRAVENOUS | Status: AC
  Administered 2024-01-25: 1500 mg via INTRAVENOUS
  Filled 2024-01-25: qty 300

## 2024-01-25 MED ORDER — LORAZEPAM 2 MG/ML IJ SOLN
1.0000 mg | Freq: Once | INTRAMUSCULAR | Status: DC | PRN
Start: 2024-01-25 — End: 2024-01-30

## 2024-01-25 MED ORDER — VANCOMYCIN HCL 750 MG/150ML IV SOLN
750.0000 mg | Freq: Two times a day (BID) | INTRAVENOUS | Status: DC
  Administered 2024-01-26 (×2): 750 mg via INTRAVENOUS
  Filled 2024-01-25 (×4): qty 150

## 2024-01-25 MED ORDER — GADOBUTROL 1 MMOL/ML IV SOLN
10.0000 mL | Freq: Once | INTRAVENOUS | Status: AC | PRN
  Administered 2024-01-25: 10 mL via INTRAVENOUS

## 2024-01-25 MED ORDER — HYDROMORPHONE HCL 1 MG/ML IJ SOLN
0.5000 mg | INTRAMUSCULAR | Status: DC | PRN
  Filled 2024-01-25: qty 0.5

## 2024-01-25 NOTE — Progress Notes (Signed)
 PROGRESS NOTE        PATIENT DETAILS Name: Paula Martinez Age: 53 y.o. Sex: female Date of Birth: 1970-04-21 Admit Date: 01/23/2024 Admitting Physician Marsha Ada, MD PCP:Sun, Vyvyan, MD  Brief Summary: Patient is a 53 y.o.  female with history of HIV-on ART, prior PML-s/p right hand/wrist contracture-who presented with 2-3-day history of fever-myalgias-low back pain-dry cough-found to have PNA and subsequently admitted to the hospitalist service  Significant events: 11/11>> admit to [facility]  Significant studies: 11/11>> CXR: Left> right base opacity  Significant microbiology data: 11/11>> COVID/influenza/RSV PCR: Negative 11/11>> blood culture 1/2: Staph hominis 11/12>> blood culture: No growth 11/12>> respiratory virus panel: Negative 11/13>> sputum cultures: Pending 11/13>> urine Legionella antigen: Pending 11/13>> urine streptococcal antigen: Pending 11/13>> blood culture: Pending   Procedures: None  Consults: None  Subjective: Continues to be febrile although fever curve better.  Complains of worsening back pain today (much more than usual baseline) UDS positive for cocaine but patient adamantly denies use-she requested repeat UDS-negative this time around.  Continues to cough-at times minimally/trace blood-but mostly purulence/green phlegm.  Objective: Vitals: Blood pressure 136/88, pulse 93, temperature (!) 102.7 F (39.3 C), temperature source Axillary, resp. rate 20, height 5' 7 (1.702 m), weight 90.7 kg, SpO2 95%.   Exam: Gen Exam:Alert awake-not in any distress HEENT:atraumatic, normocephalic Chest: Mostly right base rales. CVS:S1S2 regular Abdomen:soft non tender, non distended Extremities:no edema Neurology: Non focal Skin: no rash  Pertinent Labs/Radiology:    Latest Ref Rng & Units 01/25/2024    3:37 AM 01/24/2024    8:01 AM 01/23/2024    6:53 AM  CBC  WBC 4.0 - 10.5 K/uL 9.2  10.4  13.6   Hemoglobin 12.0 - 15.0  g/dL 88.3  87.1  86.1   Hematocrit 36.0 - 46.0 % 32.9  36.4  38.1   Platelets 150 - 400 K/uL 199  223  210     Lab Results  Component Value Date   NA 135 01/25/2024   K 3.9 01/25/2024   CL 109 01/25/2024   CO2 16 (L) 01/25/2024      Assessment/Plan: Sepsis secondary to PNA (POA) Continues be febrile Add vancomycin Continue Rocephin/Zithromax  Repeat blood cultures Check CT chest to ensure no empyema Back pain significantly worse today-checking MRI May need ID input at some point-however will await other studies first.  Back pain Mostly in lower back Checking MRI T-spine/L-spine UDS positive for cocaine but patient vehemently denies-repeat UDS negative  Staph hominis bacteremia Likely a contaminant Patient does have hardware in right ankle Started on vancomycin-see above Will await above cultures/imaging studies.  HIV Continue ART Viral load undetectable CD4 suppressed-due to acute illness.  History of PML with right wrist contracture Follows with outpatient hand surgery/orthopedic surgery  Class 1 Obesity: Estimated body mass index is 31.32 kg/m as calculated from the following:   Height as of this encounter: 5' 7 (1.702 m).   Weight as of this encounter: 90.7 kg.   Code status:   Code Status: Full Code   DVT Prophylaxis: enoxaparin (LOVENOX) injection 40 mg Start: 01/23/24 1800   Family Communication: None at bedside   Disposition Plan: Status is: Inpatient Remains inpatient appropriate because: Severity of illness   Planned Discharge Destination:Home   Diet: Diet Order             Diet regular Room service appropriate?  Yes; Fluid consistency: Thin  Diet effective now                     Antimicrobial agents: Anti-infectives (From admission, onward)    Start     Dose/Rate Route Frequency Ordered Stop   01/26/24 0200  vancomycin (VANCOREADY) IVPB 750 mg/150 mL        750 mg 150 mL/hr over 60 Minutes Intravenous Every 12 hours  01/25/24 1105     01/25/24 0915  vancomycin (VANCOREADY) IVPB 1500 mg/300 mL        1,500 mg 150 mL/hr over 120 Minutes Intravenous  Once 01/25/24 0825     01/24/24 1000  cefTRIAXone (ROCEPHIN) 2 g in sodium chloride  0.9 % 100 mL IVPB        2 g 200 mL/hr over 30 Minutes Intravenous Every 24 hours 01/23/24 1202     01/24/24 1000  azithromycin  (ZITHROMAX ) tablet 500 mg        500 mg Oral Daily 01/23/24 1202 01/25/24 0901   01/23/24 1230  bictegravir-emtricitabine -tenofovir  AF (BIKTARVY ) 50-200-25 MG per tablet 1 tablet        1 tablet Oral Daily 01/23/24 1220     01/23/24 0745  azithromycin  (ZITHROMAX ) 500 mg in sodium chloride  0.9 % 250 mL IVPB        500 mg 250 mL/hr over 60 Minutes Intravenous  Once 01/23/24 0737 01/23/24 0924   01/23/24 0700  cefTRIAXone (ROCEPHIN) 2 g in sodium chloride  0.9 % 100 mL IVPB        2 g 200 mL/hr over 30 Minutes Intravenous  Once 01/23/24 0645 01/23/24 0737        MEDICATIONS: Scheduled Meds:  bictegravir-emtricitabine -tenofovir  AF  1 tablet Oral Daily   enoxaparin (LOVENOX) injection  40 mg Subcutaneous Q24H   insulin aspart  0-15 Units Subcutaneous TID AC & HS   Continuous Infusions:  cefTRIAXone (ROCEPHIN)  IV 2 g (01/25/24 0900)   vancomycin 1,500 mg (01/25/24 1136)   [START ON 01/26/2024] vancomycin     PRN Meds:.acetaminophen , HYDROmorphone  (DILAUDID ) injection, hydrOXYzine , ibuprofen, ipratropium-albuterol , LORazepam, ondansetron  (ZOFRAN ) IV, oxyCODONE    I have personally reviewed following labs and imaging studies  LABORATORY DATA: CBC: Recent Labs  Lab 01/23/24 0653 01/24/24 0801 01/25/24 0337  WBC 13.6* 10.4 9.2  NEUTROABS 11.9*  --   --   HGB 13.8 12.8 11.6*  HCT 38.1 36.4 32.9*  MCV 79.7* 79.8* 79.9*  PLT 210 223 199    Basic Metabolic Panel: Recent Labs  Lab 01/23/24 0653 01/23/24 1333 01/23/24 2308 01/24/24 0801 01/25/24 0337  NA 138 135  --  136 135  K 2.6* 3.9  --  3.9 3.9  CL 105 108  --  109 109  CO2  18* 16*  --  17* 16*  GLUCOSE 244* 170*  --  184* 98  BUN 6 6  --  6 8  CREATININE 0.83 0.83  --  0.77 0.88  CALCIUM  8.4* 7.8*  --  8.3* 8.1*  MG  --   --  2.0  --   --     GFR: Estimated Creatinine Clearance: 85.4 mL/min (by C-G formula based on SCr of 0.88 mg/dL).  Liver Function Tests: Recent Labs  Lab 01/23/24 0653 01/24/24 0801  AST 19 21  ALT 19 19  ALKPHOS 76 63  BILITOT 1.5* 1.1  PROT 6.9 6.2*  ALBUMIN 2.8* 2.4*   No results for input(s): LIPASE, AMYLASE in the last 168 hours. No  results for input(s): AMMONIA in the last 168 hours.  Coagulation Profile: Recent Labs  Lab 01/23/24 0653  INR 1.2    Cardiac Enzymes: No results for input(s): CKTOTAL, CKMB, CKMBINDEX, TROPONINI in the last 168 hours.  BNP (last 3 results) No results for input(s): PROBNP in the last 8760 hours.  Lipid Profile: No results for input(s): CHOL, HDL, LDLCALC, TRIG, CHOLHDL, LDLDIRECT in the last 72 hours.  Thyroid Function Tests: No results for input(s): TSH, T4TOTAL, FREET4, T3FREE, THYROIDAB in the last 72 hours.  Anemia Panel: No results for input(s): VITAMINB12, FOLATE, FERRITIN, TIBC, IRON, RETICCTPCT in the last 72 hours.  Urine analysis:    Component Value Date/Time   COLORURINE AMBER (A) 01/23/2024 0652   APPEARANCEUR HAZY (A) 01/23/2024 0652   LABSPEC 1.022 01/23/2024 0652   PHURINE 5.0 01/23/2024 0652   GLUCOSEU >=500 (A) 01/23/2024 0652   HGBUR MODERATE (A) 01/23/2024 0652   BILIRUBINUR NEGATIVE 01/23/2024 0652   KETONESUR 5 (A) 01/23/2024 0652   PROTEINUR 100 (A) 01/23/2024 0652   NITRITE NEGATIVE 01/23/2024 0652   LEUKOCYTESUR NEGATIVE 01/23/2024 0652    Sepsis Labs: Lactic Acid, Venous    Component Value Date/Time   LATICACIDVEN 1.7 01/23/2024 0658    MICROBIOLOGY: Recent Results (from the past 240 hours)  Blood Culture (routine x 2)     Status: None (Preliminary result)   Collection Time: 01/23/24   6:25 AM   Specimen: BLOOD LEFT WRIST  Result Value Ref Range Status   Specimen Description BLOOD LEFT WRIST  Final   Special Requests   Final    BOTTLES DRAWN AEROBIC ONLY Blood Culture results may not be optimal due to an inadequate volume of blood received in culture bottles   Culture   Final    NO GROWTH 2 DAYS Performed at [facility], [address], [place], [place] [address]    Report Status PENDING  Incomplete  Resp panel by RT-PCR (RSV, Flu A&B, Covid)     Status: None   Collection Time: 01/23/24  6:53 AM   Specimen: Nasal Swab  Result Value Ref Range Status   SARS Coronavirus 2 by RT PCR NEGATIVE NEGATIVE Final   Influenza A by PCR NEGATIVE NEGATIVE Final   Influenza B by PCR NEGATIVE NEGATIVE Final    Comment: (NOTE) The Xpert Xpress SARS-CoV-2/FLU/RSV plus assay is intended as an aid in the diagnosis of influenza from Nasopharyngeal swab specimens and should not be used as a sole basis for treatment. Nasal washings and aspirates are unacceptable for Xpert Xpress SARS-CoV-2/FLU/RSV testing.  Fact Sheet for Patients: bloggercourse.com  Fact Sheet for Healthcare Providers: seriousbroker.it  This test is not yet approved or cleared by the [place]  FDA and has been authorized for detection and/or diagnosis of SARS-CoV-2 by FDA under an Emergency Use Authorization (EUA). This EUA will remain in effect (meaning this test can be used) for the duration of the COVID-19 declaration under Section 564(b)(1) of the Act, 21 U.S.C. section 360bbb-3(b)(1), unless the authorization is terminated or revoked.     Resp Syncytial Virus by PCR NEGATIVE NEGATIVE Final    Comment: (NOTE) Fact Sheet for Patients: bloggercourse.com  Fact Sheet for Healthcare Providers: seriousbroker.it  This test is not yet approved or cleared by the [place]  FDA and has been authorized  for detection and/or diagnosis of SARS-CoV-2 by FDA under an Emergency Use Authorization (EUA). This EUA will remain in effect (meaning this test can be used) for the duration of the  COVID-19 declaration under Section 564(b)(1) of the Act, 21 U.S.C. section 360bbb-3(b)(1), unless the authorization is terminated or revoked.  Performed at [facility], [address], [place], [place] [address]   Blood Culture (routine x 2)     Status: Abnormal (Preliminary result)   Collection Time: 01/23/24  6:53 AM   Specimen: BLOOD  Result Value Ref Range Status   Specimen Description BLOOD LEFT ANTECUBITAL  Final   Special Requests   Final    BOTTLES DRAWN AEROBIC AND ANAEROBIC Blood Culture results may not be optimal due to an inadequate volume of blood received in culture bottles   Culture  Setup Time   Final    GRAM POSITIVE COCCI IN CLUSTERS AEROBIC BOTTLE ONLY CRITICAL RESULT CALLED TO, READ BACK BY AND VERIFIED WITH: PHARMD G ABBOTT 01/24/2024 @ 0343 BY AB    Culture (A)  Final    STAPHYLOCOCCUS HOMINIS THE SIGNIFICANCE OF ISOLATING THIS ORGANISM FROM A SINGLE SET OF BLOOD CULTURES WHEN MULTIPLE SETS ARE DRAWN IS UNCERTAIN. PLEASE NOTIFY THE MICROBIOLOGY DEPARTMENT WITHIN ONE WEEK IF SPECIATION AND SENSITIVITIES ARE REQUIRED. Performed at [facility], [address] [place], [place] [address]    Report Status PENDING  Incomplete  Respiratory (~20 pathogens) panel by PCR     Status: None   Collection Time: 01/23/24  6:53 AM   Specimen: Nasopharyngeal Swab; Respiratory  Result Value Ref Range Status   Adenovirus NOT DETECTED NOT DETECTED Final   Coronavirus 229E NOT DETECTED NOT DETECTED Final    Comment: (NOTE) The Coronavirus on the Respiratory Panel, DOES NOT test for the novel  Coronavirus (2019 nCoV)    Coronavirus HKU1 NOT DETECTED NOT DETECTED Final   Coronavirus NL63 NOT DETECTED NOT DETECTED Final   Coronavirus OC43 NOT DETECTED NOT DETECTED Final    Metapneumovirus NOT DETECTED NOT DETECTED Final   Rhinovirus / Enterovirus NOT DETECTED NOT DETECTED Final   Influenza A NOT DETECTED NOT DETECTED Final   Influenza B NOT DETECTED NOT DETECTED Final   Parainfluenza Virus 1 NOT DETECTED NOT DETECTED Final   Parainfluenza Virus 2 NOT DETECTED NOT DETECTED Final   Parainfluenza Virus 3 NOT DETECTED NOT DETECTED Final   Parainfluenza Virus 4 NOT DETECTED NOT DETECTED Final   Respiratory Syncytial Virus NOT DETECTED NOT DETECTED Final   Bordetella pertussis NOT DETECTED NOT DETECTED Final   Bordetella Parapertussis NOT DETECTED NOT DETECTED Final   Chlamydophila pneumoniae NOT DETECTED NOT DETECTED Final   Mycoplasma pneumoniae NOT DETECTED NOT DETECTED Final    Comment: Performed at [facility], [address], [place], [place] [address]  Blood Culture ID Panel (Reflexed)     Status: Abnormal   Collection Time: 01/23/24  6:53 AM  Result Value Ref Range Status   Enterococcus faecalis NOT DETECTED NOT DETECTED Final   Enterococcus Faecium NOT DETECTED NOT DETECTED Final   Listeria monocytogenes NOT DETECTED NOT DETECTED Final   Staphylococcus species DETECTED (A) NOT DETECTED Final    Comment: CRITICAL RESULT CALLED TO, READ BACK BY AND VERIFIED WITH: PHARMD G ABBOTT 01/24/2024 @ 0343 BY AB    Staphylococcus aureus (BCID) NOT DETECTED NOT DETECTED Final   Staphylococcus epidermidis NOT DETECTED NOT DETECTED Final   Staphylococcus lugdunensis NOT DETECTED NOT DETECTED Final   Streptococcus species NOT DETECTED NOT DETECTED Final   Streptococcus agalactiae NOT DETECTED NOT DETECTED Final   Streptococcus pneumoniae NOT DETECTED NOT DETECTED Final   Streptococcus pyogenes NOT DETECTED NOT DETECTED  Final   A.calcoaceticus-baumannii NOT DETECTED NOT DETECTED Final   Bacteroides fragilis NOT DETECTED NOT DETECTED Final   Enterobacterales NOT DETECTED NOT DETECTED Final   Enterobacter cloacae complex NOT DETECTED NOT DETECTED Final    Escherichia coli NOT DETECTED NOT DETECTED Final   Klebsiella aerogenes NOT DETECTED NOT DETECTED Final   Klebsiella oxytoca NOT DETECTED NOT DETECTED Final   Klebsiella pneumoniae NOT DETECTED NOT DETECTED Final   Proteus species NOT DETECTED NOT DETECTED Final   Salmonella species NOT DETECTED NOT DETECTED Final   Serratia marcescens NOT DETECTED NOT DETECTED Final   Haemophilus influenzae NOT DETECTED NOT DETECTED Final   Neisseria meningitidis NOT DETECTED NOT DETECTED Final   Pseudomonas aeruginosa NOT DETECTED NOT DETECTED Final   Stenotrophomonas maltophilia NOT DETECTED NOT DETECTED Final   Candida albicans NOT DETECTED NOT DETECTED Final   Candida auris NOT DETECTED NOT DETECTED Final   Candida glabrata NOT DETECTED NOT DETECTED Final   Candida krusei NOT DETECTED NOT DETECTED Final   Candida parapsilosis NOT DETECTED NOT DETECTED Final   Candida tropicalis NOT DETECTED NOT DETECTED Final   Cryptococcus neoformans/gattii NOT DETECTED NOT DETECTED Final    Comment: Performed at [facility], [address], [place], [place] [address]  Culture, blood (Routine X 2) w Reflex to ID Panel     Status: None (Preliminary result)   Collection Time: 01/24/24  8:01 AM   Specimen: BLOOD LEFT HAND  Result Value Ref Range Status   Specimen Description BLOOD LEFT HAND  Final   Special Requests   Final    BOTTLES DRAWN AEROBIC AND ANAEROBIC Blood Culture adequate volume   Culture   Final    NO GROWTH < 24 HOURS Performed at [facility], [address] [place], [place] [address]    Report Status PENDING  Incomplete  Culture, blood (Routine X 2) w Reflex to ID Panel     Status: None (Preliminary result)   Collection Time: 01/24/24  8:01 AM   Specimen: BLOOD LEFT ARM  Result Value Ref Range Status   Specimen Description BLOOD LEFT ARM  Final   Special Requests   Final    BOTTLES DRAWN AEROBIC AND ANAEROBIC Blood Culture adequate volume   Culture   Final    NO GROWTH < 24  HOURS Performed at [facility], [address], [place], [place] [address]    Report Status PENDING  Incomplete  Respiratory (~20 pathogens) panel by PCR     Status: None   Collection Time: 01/24/24  9:51 AM   Specimen: Nasopharyngeal Swab; Respiratory  Result Value Ref Range Status   Adenovirus NOT DETECTED NOT DETECTED Final   Coronavirus 229E NOT DETECTED NOT DETECTED Final    Comment: (NOTE) The Coronavirus on the Respiratory Panel, DOES NOT test for the novel  Coronavirus (2019 nCoV)    Coronavirus HKU1 NOT DETECTED NOT DETECTED Final   Coronavirus NL63 NOT DETECTED NOT DETECTED Final   Coronavirus OC43 NOT DETECTED NOT DETECTED Final   Metapneumovirus NOT DETECTED NOT DETECTED Final   Rhinovirus / Enterovirus NOT DETECTED NOT DETECTED Final   Influenza A NOT DETECTED NOT DETECTED Final   Influenza B NOT DETECTED NOT DETECTED Final   Parainfluenza Virus 1 NOT DETECTED NOT DETECTED Final   Parainfluenza Virus 2 NOT DETECTED NOT DETECTED Final   Parainfluenza Virus 3 NOT DETECTED NOT DETECTED Final   Parainfluenza Virus 4 NOT DETECTED NOT DETECTED Final   Respiratory Syncytial  Virus NOT DETECTED NOT DETECTED Final   Bordetella pertussis NOT DETECTED NOT DETECTED Final   Bordetella Parapertussis NOT DETECTED NOT DETECTED Final   Chlamydophila pneumoniae NOT DETECTED NOT DETECTED Final   Mycoplasma pneumoniae NOT DETECTED NOT DETECTED Final    Comment: Performed at [facility], [address] [place], [place] [address]  MRSA Next Gen by PCR, Nasal     Status: None   Collection Time: 01/25/24  9:25 AM   Specimen: Nasal Mucosa; Nasal Swab  Result Value Ref Range Status   MRSA by PCR Next Gen NOT DETECTED NOT DETECTED Final    Comment: (NOTE) The GeneXpert MRSA Assay (FDA approved for NASAL specimens only), is one component of a comprehensive MRSA colonization surveillance program. It is not intended to diagnose MRSA infection nor to guide or monitor treatment for  MRSA infections. Test performance is not FDA approved in patients less than 46 years old. Performed at [facility], [address], [place], [place] [address]     RADIOLOGY STUDIES/RESULTS: DG Chest Port 1 View Result Date: 01/25/2024 CLINICAL DATA:  Shortness of breath. EXAM: PORTABLE CHEST 1 VIEW COMPARISON:  01/23/2024 FINDINGS: Hazy opacity over the right mid and lower lung is suspicious for airspace disease although superimposed component of soft tissue may accentuate this finding. Patchy airspace disease noted left mid and lower lung. No substantial pleural effusion. The cardio pericardial silhouette is enlarged. Telemetry leads overlie the chest. IMPRESSION: 1. Hazy opacity over the right mid and lower lung is suspicious for airspace disease/pneumonia although superimposed component of soft tissue may accentuate this finding. 2. Patchy airspace disease left mid and lower lung also suspicious for pneumonia. Electronically Signed   By: Camellia Candle M.D.   On: 01/25/2024 08:08     LOS: 2 days   Donalda Applebaum, MD  [facility]    To contact the attending provider between 7A-7P or the covering provider during after hours 7P-7A, please log into the web site [email or web address] and access using universal  password for that web site. If you do not have the password, please call the hospital operator.  01/25/2024, 11:54 AM

## 2024-01-25 NOTE — Evaluation (Signed)
 Physical Therapy Evaluation Patient Details Name: Paula Martinez MRN: [id number] DOB: [birth year] Today's Date: 01/25/2024  History of Present Illness  Patient is a 53 y.o.  female who presented with 2-3-day history of fever-myalgias-low back pain-dry cough-found to have PNA.  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  Clinical Impression  Pt presents with admitting diagnosis above. Pt today requiring Min A with HHA for all mobility. Pt noted with R circumduction gait and reports that this is baseline. Pt normally ambulates with SPC at home due to hand contracture. 92% on RA however was noted to be rather dyspneic during ambulation. PTA pt reports Mod I with SPC. Recommend HHPT upon DC. PT will continue to follow.          If plan is discharge home, recommend the following: A little help with walking and/or transfers;A little help with bathing/dressing/bathroom;Assist for transportation;Help with stairs or ramp for entrance   Can travel by private vehicle        Equipment Recommendations None recommended by PT  Recommendations for Other Services       Functional Status Assessment Patient has had a recent decline in their functional status and demonstrates the ability to make significant improvements in function in a reasonable and predictable amount of time.     Precautions / Restrictions Precautions Precautions: Fall Recall of Precautions/Restrictions: Intact Restrictions Weight Bearing Restrictions Per Provider Order: No      Mobility  Bed Mobility Overal bed mobility: Needs Assistance Bed Mobility: Supine to Sit, Sit to Supine     Supine to sit: Min assist Sit to supine: Contact guard assist   General bed mobility comments: Min A HHA for trunk elevation.    Transfers Overall transfer level: Needs assistance Equipment used: 1 person hand held assist Transfers: Sit to/from Stand Sit to Stand: Min assist           General  transfer comment: Min A to power up    Ambulation/Gait Ambulation/Gait assistance: Min assist Gait Distance (Feet): 15 Feet Assistive device: 1 person hand held assist Gait Pattern/deviations: Antalgic, Decreased stride length, Step-through pattern, Trunk flexed Gait velocity: decreased     General Gait Details: Pt noted with R circumduction gait and reports that this is baseline. Pt normally ambulates with SPC at home due to hand contracture. 92% on RA however was noted to be rather dyspneic during ambulation.  Stairs            Wheelchair Mobility     Tilt Bed    Modified Rankin (Stroke Patients Only)       Balance Overall balance assessment: Needs assistance Sitting-balance support: Bilateral upper extremity supported, Feet supported Sitting balance-Leahy Scale: Good     Standing balance support: Single extremity supported, During functional activity Standing balance-Leahy Scale: Poor Standing balance comment: Reliant on therapist                             Pertinent Vitals/Pain Pain Assessment Pain Assessment: 0-10 Pain Score: 7  Pain Location: low back Pain Descriptors / Indicators: Throbbing, Aching, Constant Pain Intervention(s): Premedicated before session, Monitored during session, Limited activity within patient's tolerance    Home Living Family/patient expects to be discharged to:: Private residence Living Arrangements: Other relatives (Brother) Available Help at Discharge: Family;Available PRN/intermittently Type of Home: House Home Access: Ramped entrance       Home Layout: One level Home Equipment: Shower seat - built in;Transport  chair;Cane - quad;Cane - single point      Prior Function Prior Level of Function : Independent/Modified Independent;Driving             Mobility Comments: Mod I cane. Limited by back pain per pt. ADLs Comments: Ind     Extremity/Trunk Assessment   Upper Extremity Assessment Upper  Extremity Assessment: RUE deficits/detail RUE Deficits / Details: Residual R hand/wrist contracture.    Lower Extremity Assessment Lower Extremity Assessment: Generalized weakness    Cervical / Trunk Assessment Cervical / Trunk Assessment: Kyphotic  Communication   Communication Communication: No apparent difficulties    Cognition Arousal: Alert Behavior During Therapy: WFL for tasks assessed/performed   PT - Cognitive impairments: No apparent impairments                       PT - Cognition Comments: Can be tangetial at times Following commands: Intact       Cueing Cueing Techniques: Verbal cues, Tactile cues     General Comments General comments (skin integrity, edema, etc.): 92-96% on RA    Exercises     Assessment/Plan    PT Assessment Patient needs continued PT services  PT Problem List Decreased strength;Decreased range of motion;Decreased balance;Decreased activity tolerance;Decreased mobility;Decreased coordination;Decreased knowledge of use of DME;Decreased safety awareness;Decreased knowledge of precautions;Cardiopulmonary status limiting activity       PT Treatment Interventions DME instruction;Gait training;Stair training;Functional mobility training;Therapeutic activities;Therapeutic exercise;Balance training;Neuromuscular re-education;Cognitive remediation;Patient/family education    PT Goals (Current goals can be found in the Care Plan section)  Acute Rehab PT Goals Patient Stated Goal: to go home PT Goal Formulation: With patient Time For Goal Achievement: 02/08/24 Potential to Achieve Goals: Good    Frequency Min 2X/week     Co-evaluation               AM-PAC PT 6 Clicks Mobility  Outcome Measure Help needed turning from your back to your side while in a flat bed without using bedrails?: A Little Help needed moving from lying on your back to sitting on the side of a flat bed without using bedrails?: A Little Help needed  moving to and from a bed to a chair (including a wheelchair)?: A Little Help needed standing up from a chair using your arms (e.g., wheelchair or bedside chair)?: A Little Help needed to walk in hospital room?: A Little Help needed climbing 3-5 steps with a railing? : A Little 6 Click Score: 18    End of Session Equipment Utilized During Treatment: Gait belt Activity Tolerance: Patient tolerated treatment well Patient left: in bed;with call bell/phone within reach Nurse Communication: Mobility status PT Visit Diagnosis: Other abnormalities of gait and mobility (R26.89)    Time: 8652-8584 PT Time Calculation (min) (ACUTE ONLY): 28 min   Charges:   PT Evaluation $PT Eval Moderate Complexity: 1 Mod PT Treatments $Gait Training: 8-22 mins PT General Charges $$ ACUTE PT VISIT: 1 Visit         Sueellen NOVAK, PT, DPT Acute Rehab Services [phone]   Damyn Weitzel 01/25/2024, 4:04 PM

## 2024-01-25 NOTE — Plan of Care (Signed)

## 2024-01-25 NOTE — Progress Notes (Signed)
 Pharmacy Antibiotic Note  Paula Martinez is a 53 y.o. female admitted on 01/23/2024 with pneumonia.  Pharmacy has been consulted for vanc dosing.  Pt with HIV who as admitted for PNA. She was empirically started on ceftriaxone/azith. Her HIV is controlled. She has became persistently febrile since here. Vanc has been ordered empirically. Staph hominis grew out of 1/3 bottles. Possible contaminant. She does have some hardware in her wrist. Plan for MRI for back pain.   Scr<1 Wbc wnl  HIV VL<20  Plan: Vanc 1.5g IV x1 then 750mg  IV BID>>AUC, scr 0.88 Level as needed   Height: 5' 7 (170.2 cm) Weight: 90.7 kg (200 lb) IBW/kg (Calculated) : 61.6  Temp (24hrs), Avg:101.4 F (38.6 C), Min:99.4 F (37.4 C), Max:102.8 F (39.3 C)  Recent Labs  Lab 01/23/24 0653 01/23/24 0658 01/23/24 1333 01/24/24 0801 01/25/24 0337  WBC 13.6*  --   --  10.4 9.2  CREATININE 0.83  --  0.83 0.77 0.88  LATICACIDVEN  --  1.7  --   --   --     Estimated Creatinine Clearance: 85.4 mL/min (by C-G formula based on SCr of 0.88 mg/dL).    Allergies  Allergen Reactions   Morphine Other (See Comments)    Hallucinations    Methadone Nausea And Vomiting    Antimicrobials this admission: CTX 11/11>> Azithro 11/11>>11/13 Vanc 11/13>>  Dose adjustments this admission:   Microbiology results: 11/11 blood>>1/3 staph hominis 11/12 blood>> 11/12 resp panel neg  Sergio Batch, PharmD, BCIDP, AAHIVP, CPP Infectious Disease Pharmacist 01/25/2024 11:02 AM

## 2024-01-25 NOTE — Plan of Care (Signed)
   Problem: Education: Goal: Knowledge of General Education information will improve Description: Including pain rating scale, medication(s)/side effects and non-pharmacologic comfort measures Outcome: Progressing   Problem: Clinical Measurements: Goal: Respiratory complications will improve Outcome: Progressing   Problem: Activity: Goal: Risk for activity intolerance will decrease Outcome: Progressing

## 2024-01-25 NOTE — Evaluation (Signed)
 Occupational Therapy Evaluation Patient Details Name: Paula Martinez MRN: [id number] DOB: 04-28-1970 Today's Date: 01/25/2024   History of Present Illness   Patient is a 53 yr old  female who presented with 2-3-day history of fever-myalgias-low back pain-dry cough-found to have PNA.  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     Clinical Impressions The pt is currently presenting below her baseline level of functioning for ADL and IADL management (normally independent with ADLs, working, cooking and cleaning). At current, she requires CGA to min assist for most tasks, including lower body dressing and sit to stand. She reported a history of neuropathy causing pain and burning in her feet, as well as chronic low back pain; her back pain is currently worse than typical. She also reported feelings of generalized weakness and compromised endurance/activity tolerance. She will benefit from OT services to maximize her independence with self care tasks, to facilitate her safe return home, and to decrease the risk for restricted participation in meaningful activities. Home health OT is recommended.      If plan is discharge home, recommend the following:   Help with stairs or ramp for entrance;Assistance with cooking/housework     Functional Status Assessment   Patient has had a recent decline in their functional status and demonstrates the ability to make significant improvements in function in a reasonable and predictable amount of time.     Equipment Recommendations   None recommended by OT     Recommendations for Other Services         Precautions/Restrictions   Restrictions Weight Bearing Restrictions Per Provider Order: No     Mobility Bed Mobility      General bed mobility comments: she was received seated EOB    Transfers Overall transfer level: Needs assistance Equipment used: 1 person hand held assist Transfers: Sit  to/from Stand Sit to Stand: Contact guard assist, From elevated surface         Balance     Sitting balance-Leahy Scale: Good         Standing balance comment: CGA for static standing         ADL either performed or assessed with clinical judgement   ADL Overall ADL's : Needs assistance/impaired Eating/Feeding: Independent;Sitting   Grooming: Minimal assistance;Sitting Grooming Details (indicate cue type and reason): limited by RUE ROM, strength and coordination deficits; requires intermittent assist for contralateral grooming tasks, due to this Upper Body Bathing: Minimal assistance;Sitting   Lower Body Bathing: Contact guard assist;Sit to/from stand;Sitting/lateral leans   Upper Body Dressing : Set up;Sitting   Lower Body Dressing: Contact guard assist;Sitting/lateral leans;Sit to/from stand                                    Pertinent Vitals/Pain Pain Assessment Pain Assessment: 0-10 Pain Score: 10-Worst pain ever Pain Location: chronic low back pain Pain Descriptors / Indicators: Constant Pain Intervention(s): Limited activity within patient's tolerance, Monitored during session, Repositioned     Extremity/Trunk Assessment Upper Extremity Assessment Upper Extremity Assessment: Right hand dominant;RUE deficits/detail;LUE deficits/detail RUE Deficits / Details:  (No functional AROM of RUE. Required AAROM to PROM. Baseline wrist/hand contracture. Generalized stiffness of joints. Impaired tone). RUE Coordination: decreased fine motor;decreased gross motor LUE Deficits / Details: AROM and strength WFL   Lower Extremity Assessment Lower Extremity Assessment: LLE deficits/detail;RLE deficits/detail RLE Deficits / Details: she reported having chronic burning and pain  in her feet RLE Sensation: history of peripheral neuropathy LLE Deficits / Details: she reported having chronic burning and pain in her feet LLE Sensation: history of peripheral  neuropathy      Communication Communication Communication: No apparent difficulties   Cognition Arousal: Alert Behavior During Therapy: WFL for tasks assessed/performed          OT - Cognition Comments: Oriented x4        Following commands: Intact       Cueing  General Comments   Cueing Techniques: Verbal cues             Home Living Family/patient expects to be discharged to:: Private residence Living Arrangements: Other relatives (brother) Available Help at Discharge: Family;Available PRN/intermittently Type of Home: House Home Access: Ramped entrance     Home Layout: One level     Bathroom Shower/Tub: Producer, Television/film/video: Handicapped height     Home Equipment: Cane - quad;Cane - single point          Prior Functioning/Environment Prior Level of Function : Independent/Modified Independent;Driving             Mobility Comments:  (She used a cane for ambulation.) ADLs Comments:  (She was independent with ADLs, driving, cooking, cleaning, and 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.)    OT Problem List: Decreased strength;Decreased range of motion;Impaired balance (sitting and/or standing);Decreased coordination;Decreased knowledge of use of DME or AE;Pain;Impaired UE functional use   OT Treatment/Interventions: Self-care/ADL training;Therapeutic exercise;Therapeutic activities;Splinting;Energy conservation;Patient/family education;DME and/or AE instruction;Balance training      OT Goals(Current goals can be found in the care plan section)   Acute Rehab OT Goals Patient Stated Goal: decreased back pain OT Goal Formulation: With patient Time For Goal Achievement: 02/08/24 Potential to Achieve Goals: Good ADL Goals Pt Will Perform Upper Body Dressing: with modified independence;sitting Pt Will Perform Lower Body Dressing: with modified independence;sit to/from stand Pt Will Transfer to Toilet: with modified  independence;ambulating Pt Will Perform Toileting - Clothing Manipulation and hygiene: with modified independence;sit to/from stand   OT Frequency:  Min 2X/week       AM-PAC OT 6 Clicks Daily Activity     Outcome Measure Help from another person eating meals?: None Help from another person taking care of personal grooming?: A Little Help from another person toileting, which includes using toliet, bedpan, or urinal?: A Little Help from another person bathing (including washing, rinsing, drying)?: A Little Help from another person to put on and taking off regular upper body clothing?: A Little Help from another person to put on and taking off regular lower body clothing?: A Little 6 Click Score: 19   End of Session Equipment Utilized During Treatment: Oxygen Nurse Communication: Mobility status  Activity Tolerance: Patient limited by pain Patient left: in bed;with call bell/phone within reach  OT Visit Diagnosis: Pain;Unsteadiness on feet (R26.81);Other abnormalities of gait and mobility (R26.89);Muscle weakness (generalized) (M62.81) Pain - part of body:  (low back)                Time: 1650-1710 OT Time Calculation (min): 20 min Charges:  OT General Charges $OT Visit: 1 Visit OT Evaluation $OT Eval Moderate Complexity: 1 Mod    Shawnte Demarest J Harris, OTR/L 01/25/2024, 6:13 PM
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
## 2024-01-26 DIAGNOSIS — J189 Pneumonia, unspecified organism: Secondary | ICD-10-CM | POA: Diagnosis not present

## 2024-01-26 DIAGNOSIS — B20 Human immunodeficiency virus [HIV] disease: Secondary | ICD-10-CM | POA: Diagnosis not present

## 2024-01-26 DIAGNOSIS — A419 Sepsis, unspecified organism: Secondary | ICD-10-CM | POA: Diagnosis not present

## 2024-01-26 DIAGNOSIS — R509 Fever, unspecified: Secondary | ICD-10-CM

## 2024-01-26 DIAGNOSIS — Z79899 Other long term (current) drug therapy: Secondary | ICD-10-CM | POA: Diagnosis not present

## 2024-01-26 LAB — GLUCOSE, CAPILLARY
Glucose-Capillary: 104 mg/dL — ABNORMAL HIGH (ref 70–99)
Glucose-Capillary: 114 mg/dL — ABNORMAL HIGH (ref 70–99)
Glucose-Capillary: 96 mg/dL (ref 70–99)
Glucose-Capillary: 137 mg/dL — ABNORMAL HIGH (ref 70–99)
Glucose-Capillary: 136 mg/dL — ABNORMAL HIGH (ref 70–99)

## 2024-01-26 LAB — COMPREHENSIVE METABOLIC PANEL WITH GFR
ALT: 36 U/L (ref 0–44)
AST: 44 U/L — ABNORMAL HIGH (ref 15–41)
Albumin: 2 g/dL — ABNORMAL LOW (ref 3.5–5.0)
Alkaline Phosphatase: 51 U/L (ref 38–126)
Anion gap: 10 (ref 5–15)
BUN: 5 mg/dL — ABNORMAL LOW (ref 6–20)
CO2: 19 mmol/L — ABNORMAL LOW (ref 22–32)
Calcium: 8.2 mg/dL — ABNORMAL LOW (ref 8.9–10.3)
Chloride: 110 mmol/L (ref 98–111)
Creatinine, Ser: 0.71 mg/dL (ref 0.44–1.00)
GFR, Estimated: >60 mL/min (ref >60)
Glucose, Bld: 99 mg/dL (ref 70–99)
Potassium: 3.5 mmol/L (ref 3.5–5.1)
Sodium: 139 mmol/L (ref 135–145)
Total Bilirubin: 1.2 mg/dL (ref 0.0–1.2)
Total Protein: 5.8 g/dL — ABNORMAL LOW (ref 6.5–8.1)

## 2024-01-26 LAB — CBC
HCT: 29.4 % — ABNORMAL LOW (ref 36.0–46.0)
Hemoglobin: 10.5 g/dL — ABNORMAL LOW (ref 12.0–15.0)
MCH: 28.2 pg (ref 26.0–34.0)
MCHC: 35.7 g/dL (ref 30.0–36.0)
MCV: 79 fL — ABNORMAL LOW (ref 80.0–100.0)
Platelets: 223 K/uL (ref 150–400)
RBC: 3.72 MIL/uL — ABNORMAL LOW (ref 3.87–5.11)
RDW: 13.4 % (ref 11.5–15.5)
WBC: 6.3 K/uL (ref 4.0–10.5)
nRBC: 0 % (ref 0.0–0.2)

## 2024-01-26 LAB — EXPECTORATED SPUTUM ASSESSMENT W GRAM STAIN, RFLX TO RESP C

## 2024-01-26 LAB — CULTURE, BLOOD (ROUTINE X 2)

## 2024-01-26 LAB — LEGIONELLA PNEUMOPHILA SEROGP 1 UR AG: L. pneumophila Serogp 1 Ur Ag: NEGATIVE

## 2024-01-26 MED ORDER — SENNOSIDES-DOCUSATE SODIUM 8.6-50 MG PO TABS: 2.0000 | ORAL_TABLET | Freq: Every evening | ORAL | Status: DC | PRN

## 2024-01-26 MED ORDER — POLYETHYLENE GLYCOL 3350 17 G PO PACK
17.0000 g | PACK | Freq: Every day | ORAL | Status: DC
  Administered 2024-01-26 – 2024-01-28 (×3): 17 g via ORAL
  Filled 2024-01-26 (×3): qty 1

## 2024-01-26 MED ORDER — SODIUM CHLORIDE 0.9 % IV SOLN
2.0000 g | Freq: Three times a day (TID) | INTRAVENOUS | Status: DC
  Administered 2024-01-26 – 2024-01-29 (×10): 2 g via INTRAVENOUS
  Filled 2024-01-26 (×10): qty 12.5

## 2024-01-26 NOTE — Plan of Care (Signed)
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
## 2024-01-26 NOTE — Progress Notes (Signed)
 PROGRESS NOTE        PATIENT DETAILS Name: Paula Martinez Age: 53 y.o. Sex: female Date of Birth: 03/19/1970 Admit Date: 01/23/2024 Admitting Physician Marsha Ada, MD PCP:Sun, Vyvyan, MD  Brief Summary: Patient is a 53 y.o.  female with history of HIV-on ART, prior PML-s/p right hand/wrist contracture-who presented with 2-3-day history of fever-myalgias-low back pain-dry cough-found to have PNA and subsequently admitted to the hospitalist service  Significant events: 11/11>> admit to [facility] 11/13>> persistently febrile-vancomycin started.  MRI T-spine/L-spine negative.  CT chest-confirms RLL PNA without empyema/cavitation/abscess.  Significant studies: 11/11>> CXR: Left> right base opacity  Significant microbiology data: 11/11>> COVID/influenza/RSV PCR: Negative 11/11>> blood culture 1/2: Staph hominis 11/12>> blood culture: No growth 11/12>> respiratory virus panel: Negative 11/13>> sputum cultures: Nondiagnostic-specimen unacceptable for testing. 11/13>> urine Legionella antigen: Pending 11/13>> urine streptococcal antigen: Negative. 11/13>> blood culture: No growth  Procedures: None  Consults: ID  Subjective: Continues to have fever overnight-continues to have back pain-cough is productive with intermittent trace hemoptysis.  No nausea, vomiting or diarrhea.  Objective: Vitals: Blood pressure 100/64, pulse 83, temperature 98.4 F (36.9 C), temperature source Oral, resp. rate 20, height 5' 7 (1.702 m), weight 90.7 kg, SpO2 92%.   Exam: Gen Exam:Alert awake-not in any distress HEENT:atraumatic, normocephalic Chest: B/L clear to auscultation anteriorly CVS:S1S2 regular Abdomen:soft non tender, non distended Extremities:no edema Neurology: Non focal Skin: no rash  Pertinent Labs/Radiology:    Latest Ref Rng & Units 01/26/2024    2:14 AM 01/25/2024    3:37 AM 01/24/2024    8:01 AM  CBC  WBC 4.0 - 10.5 K/uL 6.3  9.2  10.4    Hemoglobin 12.0 - 15.0 g/dL 89.4  88.3  87.1   Hematocrit 36.0 - 46.0 % 29.4  32.9  36.4   Platelets 150 - 400 K/uL 223  199  223     Lab Results  Component Value Date   NA 139 01/26/2024   K 3.5 01/26/2024   CL 110 01/26/2024   CO2 19 (L) 01/26/2024      Assessment/Plan: Sepsis secondary to PNA (POA) Febrile overnight Change Rocephin to cefepime Continue vancomycin All culture data negative so far Getting ID input.    Back pain Mostly in lower back-but slightly better today. MRI T-spine/L-spine negative discitis/osteomyelitis. UDS positive for cocaine but patient vehemently denies-repeat UDS negative  Staph hominis bacteremia Likely a contaminant Patient does have hardware in right ankle On IV vancomycin Await ID input.  HIV Continue ART Viral load undetectable CD4 suppressed-due to acute illness.  History of PML with right wrist contracture Follows with outpatient hand surgery/orthopedic surgery  Class 1 Obesity: Estimated body mass index is 31.32 kg/m as calculated from the following:   Height as of this encounter: 5' 7 (1.702 m).   Weight as of this encounter: 90.7 kg.   Code status:   Code Status: Full Code   DVT Prophylaxis: enoxaparin (LOVENOX) injection 40 mg Start: 01/23/24 1800   Family Communication: None at bedside   Disposition Plan: Status is: Inpatient Remains inpatient appropriate because: Severity of illness   Planned Discharge Destination:Home   Diet: Diet Order             Diet regular Room service appropriate? Yes; Fluid consistency: Thin  Diet effective now  Antimicrobial agents: Anti-infectives (From admission, onward)    Start     Dose/Rate Route Frequency Ordered Stop   01/26/24 0815  ceFEPIme (MAXIPIME) 2 g in sodium chloride  0.9 % 100 mL IVPB        2 g 200 mL/hr over 30 Minutes Intravenous Every 8 hours 01/26/24 0729     01/26/24 0200  vancomycin (VANCOREADY) IVPB 750 mg/150 mL         750 mg 150 mL/hr over 60 Minutes Intravenous Every 12 hours 01/25/24 1105     01/25/24 0915  vancomycin (VANCOREADY) IVPB 1500 mg/300 mL        1,500 mg 150 mL/hr over 120 Minutes Intravenous  Once 01/25/24 0825 01/25/24 1336   01/24/24 1000  cefTRIAXone (ROCEPHIN) 2 g in sodium chloride  0.9 % 100 mL IVPB  Status:  Discontinued        2 g 200 mL/hr over 30 Minutes Intravenous Every 24 hours 01/23/24 1202 01/26/24 0729   01/24/24 1000  azithromycin  (ZITHROMAX ) tablet 500 mg        500 mg Oral Daily 01/23/24 1202 01/25/24 0901   01/23/24 1230  bictegravir-emtricitabine -tenofovir  AF (BIKTARVY ) 50-200-25 MG per tablet 1 tablet        1 tablet Oral Daily 01/23/24 1220     01/23/24 0745  azithromycin  (ZITHROMAX ) 500 mg in sodium chloride  0.9 % 250 mL IVPB        500 mg 250 mL/hr over 60 Minutes Intravenous  Once 01/23/24 0737 01/23/24 0924   01/23/24 0700  cefTRIAXone (ROCEPHIN) 2 g in sodium chloride  0.9 % 100 mL IVPB        2 g 200 mL/hr over 30 Minutes Intravenous  Once 01/23/24 0645 01/23/24 0737        MEDICATIONS: Scheduled Meds:  bictegravir-emtricitabine -tenofovir  AF  1 tablet Oral Daily   enoxaparin (LOVENOX) injection  40 mg Subcutaneous Q24H   insulin aspart  0-15 Units Subcutaneous TID AC & HS   polyethylene glycol  17 g Oral Daily   Continuous Infusions:  ceFEPime (MAXIPIME) IV 2 g (01/26/24 0841)   vancomycin 750 mg (01/26/24 0945)   PRN Meds:.acetaminophen , HYDROmorphone  (DILAUDID ) injection, hydrOXYzine , ibuprofen, ipratropium-albuterol , LORazepam, ondansetron  (ZOFRAN ) IV, oxyCODONE , senna-docusate   I have personally reviewed following labs and imaging studies  LABORATORY DATA: CBC: Recent Labs  Lab 01/23/24 0653 01/24/24 0801 01/25/24 0337 01/26/24 0214  WBC 13.6* 10.4 9.2 6.3  NEUTROABS 11.9*  --   --   --   HGB 13.8 12.8 11.6* 10.5*  HCT 38.1 36.4 32.9* 29.4*  MCV 79.7* 79.8* 79.9* 79.0*  PLT 210 223 199 223    Basic Metabolic Panel: Recent Labs   Lab 01/23/24 0653 01/23/24 1333 01/23/24 2308 01/24/24 0801 01/25/24 0337 01/26/24 0214  NA 138 135  --  136 135 139  K 2.6* 3.9  --  3.9 3.9 3.5  CL 105 108  --  109 109 110  CO2 18* 16*  --  17* 16* 19*  GLUCOSE 244* 170*  --  184* 98 99  BUN 6 6  --  6 8 5*  CREATININE 0.83 0.83  --  0.77 0.88 0.71  CALCIUM  8.4* 7.8*  --  8.3* 8.1* 8.2*  MG  --   --  2.0  --   --   --     GFR: Estimated Creatinine Clearance: 94 mL/min (by C-G formula based on SCr of 0.71 mg/dL).  Liver Function Tests: Recent Labs  Lab 01/23/24 9346 01/24/24  0801 01/26/24 0214  AST 19 21 44*  ALT 19 19 36  ALKPHOS 76 63 51  BILITOT 1.5* 1.1 1.2  PROT 6.9 6.2* 5.8*  ALBUMIN 2.8* 2.4* 2.0*   No results for input(s): LIPASE, AMYLASE in the last 168 hours. No results for input(s): AMMONIA in the last 168 hours.  Coagulation Profile: Recent Labs  Lab 01/23/24 0653  INR 1.2    Cardiac Enzymes: No results for input(s): CKTOTAL, CKMB, CKMBINDEX, TROPONINI in the last 168 hours.  BNP (last 3 results) No results for input(s): PROBNP in the last 8760 hours.  Lipid Profile: No results for input(s): CHOL, HDL, LDLCALC, TRIG, CHOLHDL, LDLDIRECT in the last 72 hours.  Thyroid Function Tests: No results for input(s): TSH, T4TOTAL, FREET4, T3FREE, THYROIDAB in the last 72 hours.  Anemia Panel: No results for input(s): VITAMINB12, FOLATE, FERRITIN, TIBC, IRON, RETICCTPCT in the last 72 hours.  Urine analysis:    Component Value Date/Time   COLORURINE AMBER (A) 01/23/2024 0652   APPEARANCEUR HAZY (A) 01/23/2024 0652   LABSPEC 1.022 01/23/2024 0652   PHURINE 5.0 01/23/2024 0652   GLUCOSEU >=500 (A) 01/23/2024 0652   HGBUR MODERATE (A) 01/23/2024 0652   BILIRUBINUR NEGATIVE 01/23/2024 0652   KETONESUR 5 (A) 01/23/2024 0652   PROTEINUR 100 (A) 01/23/2024 0652   NITRITE NEGATIVE 01/23/2024 0652   LEUKOCYTESUR NEGATIVE 01/23/2024 0652    Sepsis  Labs: Lactic Acid, Venous    Component Value Date/Time   LATICACIDVEN 1.7 01/23/2024 0658    MICROBIOLOGY: Recent Results (from the past 240 hours)  Blood Culture (routine x 2)     Status: None (Preliminary result)   Collection Time: 01/23/24  6:25 AM   Specimen: BLOOD LEFT WRIST  Result Value Ref Range Status   Specimen Description BLOOD LEFT WRIST  Final   Special Requests   Final    BOTTLES DRAWN AEROBIC ONLY Blood Culture results may not be optimal due to an inadequate volume of blood received in culture bottles   Culture   Final    NO GROWTH 3 DAYS Performed at [facility], [address], [place], [place] [address]    Report Status PENDING  Incomplete  Resp panel by RT-PCR (RSV, Flu A&B, Covid)     Status: None   Collection Time: 01/23/24  6:53 AM   Specimen: Nasal Swab  Result Value Ref Range Status   SARS Coronavirus 2 by RT PCR NEGATIVE NEGATIVE Final   Influenza A by PCR NEGATIVE NEGATIVE Final   Influenza B by PCR NEGATIVE NEGATIVE Final    Comment: (NOTE) The Xpert Xpress SARS-CoV-2/FLU/RSV plus assay is intended as an aid in the diagnosis of influenza from Nasopharyngeal swab specimens and should not be used as a sole basis for treatment. Nasal washings and aspirates are unacceptable for Xpert Xpress SARS-CoV-2/FLU/RSV testing.  Fact Sheet for Patients: bloggercourse.com  Fact Sheet for Healthcare Providers: seriousbroker.it  This test is not yet approved or cleared by the [place]  FDA and has been authorized for detection and/or diagnosis of SARS-CoV-2 by FDA under an Emergency Use Authorization (EUA). This EUA will remain in effect (meaning this test can be used) for the duration of the COVID-19 declaration under Section 564(b)(1) of the Act, 21 U.S.C. section 360bbb-3(b)(1), unless the authorization is terminated or revoked.     Resp Syncytial Virus by PCR NEGATIVE NEGATIVE Final     Comment: (NOTE) Fact Sheet for Patients: bloggercourse.com  Fact Sheet for Healthcare Providers: seriousbroker.it  This  test is not yet approved or cleared by the [place]  FDA and has been authorized for detection and/or diagnosis of SARS-CoV-2 by FDA under an Emergency Use Authorization (EUA). This EUA will remain in effect (meaning this test can be used) for the duration of the COVID-19 declaration under Section 564(b)(1) of the Act, 21 U.S.C. section 360bbb-3(b)(1), unless the authorization is terminated or revoked.  Performed at [facility], [address], [place], [place] [address]   Blood Culture (routine x 2)     Status: Abnormal   Collection Time: 01/23/24  6:53 AM   Specimen: BLOOD  Result Value Ref Range Status   Specimen Description BLOOD LEFT ANTECUBITAL  Final   Special Requests   Final    BOTTLES DRAWN AEROBIC AND ANAEROBIC Blood Culture results may not be optimal due to an inadequate volume of blood received in culture bottles   Culture  Setup Time   Final    GRAM POSITIVE COCCI IN CLUSTERS AEROBIC BOTTLE ONLY CRITICAL RESULT CALLED TO, READ BACK BY AND VERIFIED WITH: PHARMD G ABBOTT 01/24/2024 @ 0343 BY AB    Culture (A)  Final    STAPHYLOCOCCUS HOMINIS THE SIGNIFICANCE OF ISOLATING THIS ORGANISM FROM A SINGLE SET OF BLOOD CULTURES WHEN MULTIPLE SETS ARE DRAWN IS UNCERTAIN. PLEASE NOTIFY THE MICROBIOLOGY DEPARTMENT WITHIN ONE WEEK IF SPECIATION AND SENSITIVITIES ARE REQUIRED. Performed at [facility], [address], [place], [place] [address]    Report Status 01/26/2024 FINAL  Final  Respiratory (~20 pathogens) panel by PCR     Status: None   Collection Time: 01/23/24  6:53 AM   Specimen: Nasopharyngeal Swab; Respiratory  Result Value Ref Range Status   Adenovirus NOT DETECTED NOT DETECTED Final   Coronavirus 229E NOT DETECTED NOT DETECTED Final    Comment: (NOTE) The Coronavirus on the  Respiratory Panel, DOES NOT test for the novel  Coronavirus (2019 nCoV)    Coronavirus HKU1 NOT DETECTED NOT DETECTED Final   Coronavirus NL63 NOT DETECTED NOT DETECTED Final   Coronavirus OC43 NOT DETECTED NOT DETECTED Final   Metapneumovirus NOT DETECTED NOT DETECTED Final   Rhinovirus / Enterovirus NOT DETECTED NOT DETECTED Final   Influenza A NOT DETECTED NOT DETECTED Final   Influenza B NOT DETECTED NOT DETECTED Final   Parainfluenza Virus 1 NOT DETECTED NOT DETECTED Final   Parainfluenza Virus 2 NOT DETECTED NOT DETECTED Final   Parainfluenza Virus 3 NOT DETECTED NOT DETECTED Final   Parainfluenza Virus 4 NOT DETECTED NOT DETECTED Final   Respiratory Syncytial Virus NOT DETECTED NOT DETECTED Final   Bordetella pertussis NOT DETECTED NOT DETECTED Final   Bordetella Parapertussis NOT DETECTED NOT DETECTED Final   Chlamydophila pneumoniae NOT DETECTED NOT DETECTED Final   Mycoplasma pneumoniae NOT DETECTED NOT DETECTED Final    Comment: Performed at [facility], [address] [place], [place] [address]  Blood Culture ID Panel (Reflexed)     Status: Abnormal   Collection Time: 01/23/24  6:53 AM  Result Value Ref Range Status   Enterococcus faecalis NOT DETECTED NOT DETECTED Final   Enterococcus Faecium NOT DETECTED NOT DETECTED Final   Listeria monocytogenes NOT DETECTED NOT DETECTED Final   Staphylococcus species DETECTED (A) NOT DETECTED Final    Comment: CRITICAL RESULT CALLED TO, READ BACK BY AND VERIFIED WITH: PHARMD G ABBOTT 01/24/2024 @ 0343 BY AB    Staphylococcus aureus (BCID) NOT DETECTED NOT DETECTED Final   Staphylococcus epidermidis NOT DETECTED NOT  DETECTED Final   Staphylococcus lugdunensis NOT DETECTED NOT DETECTED Final   Streptococcus species NOT DETECTED NOT DETECTED Final   Streptococcus agalactiae NOT DETECTED NOT DETECTED Final   Streptococcus pneumoniae NOT DETECTED NOT DETECTED Final   Streptococcus pyogenes NOT DETECTED NOT DETECTED Final    A.calcoaceticus-baumannii NOT DETECTED NOT DETECTED Final   Bacteroides fragilis NOT DETECTED NOT DETECTED Final   Enterobacterales NOT DETECTED NOT DETECTED Final   Enterobacter cloacae complex NOT DETECTED NOT DETECTED Final   Escherichia coli NOT DETECTED NOT DETECTED Final   Klebsiella aerogenes NOT DETECTED NOT DETECTED Final   Klebsiella oxytoca NOT DETECTED NOT DETECTED Final   Klebsiella pneumoniae NOT DETECTED NOT DETECTED Final   Proteus species NOT DETECTED NOT DETECTED Final   Salmonella species NOT DETECTED NOT DETECTED Final   Serratia marcescens NOT DETECTED NOT DETECTED Final   Haemophilus influenzae NOT DETECTED NOT DETECTED Final   Neisseria meningitidis NOT DETECTED NOT DETECTED Final   Pseudomonas aeruginosa NOT DETECTED NOT DETECTED Final   Stenotrophomonas maltophilia NOT DETECTED NOT DETECTED Final   Candida albicans NOT DETECTED NOT DETECTED Final   Candida auris NOT DETECTED NOT DETECTED Final   Candida glabrata NOT DETECTED NOT DETECTED Final   Candida krusei NOT DETECTED NOT DETECTED Final   Candida parapsilosis NOT DETECTED NOT DETECTED Final   Candida tropicalis NOT DETECTED NOT DETECTED Final   Cryptococcus neoformans/gattii NOT DETECTED NOT DETECTED Final    Comment: Performed at [facility], [address] [place], [place] [address]  Culture, blood (Routine X 2) w Reflex to ID Panel     Status: None (Preliminary result)   Collection Time: 01/24/24  8:01 AM   Specimen: BLOOD LEFT HAND  Result Value Ref Range Status   Specimen Description BLOOD LEFT HAND  Final   Special Requests   Final    BOTTLES DRAWN AEROBIC AND ANAEROBIC Blood Culture adequate volume   Culture   Final    NO GROWTH 2 DAYS Performed at [facility], [address] [place], [place] [address]    Report Status PENDING  Incomplete  Culture, blood (Routine X 2) w Reflex to ID Panel     Status: None (Preliminary result)   Collection Time: 01/24/24  8:01 AM   Specimen:  BLOOD LEFT ARM  Result Value Ref Range Status   Specimen Description BLOOD LEFT ARM  Final   Special Requests   Final    BOTTLES DRAWN AEROBIC AND ANAEROBIC Blood Culture adequate volume   Culture   Final    NO GROWTH 2 DAYS Performed at [facility], [address] [place], [place] [address]    Report Status PENDING  Incomplete  Respiratory (~20 pathogens) panel by PCR     Status: None   Collection Time: 01/24/24  9:51 AM   Specimen: Nasopharyngeal Swab; Respiratory  Result Value Ref Range Status   Adenovirus NOT DETECTED NOT DETECTED Final   Coronavirus 229E NOT DETECTED NOT DETECTED Final    Comment: (NOTE) The Coronavirus on the Respiratory Panel, DOES NOT test for the novel  Coronavirus (2019 nCoV)    Coronavirus HKU1 NOT DETECTED NOT DETECTED Final   Coronavirus NL63 NOT DETECTED NOT DETECTED Final   Coronavirus OC43 NOT DETECTED NOT DETECTED Final   Metapneumovirus NOT DETECTED NOT DETECTED Final   Rhinovirus / Enterovirus NOT DETECTED NOT DETECTED Final   Influenza A NOT DETECTED NOT DETECTED Final   Influenza B NOT DETECTED NOT DETECTED Final  Parainfluenza Virus 1 NOT DETECTED NOT DETECTED Final   Parainfluenza Virus 2 NOT DETECTED NOT DETECTED Final   Parainfluenza Virus 3 NOT DETECTED NOT DETECTED Final   Parainfluenza Virus 4 NOT DETECTED NOT DETECTED Final   Respiratory Syncytial Virus NOT DETECTED NOT DETECTED Final   Bordetella pertussis NOT DETECTED NOT DETECTED Final   Bordetella Parapertussis NOT DETECTED NOT DETECTED Final   Chlamydophila pneumoniae NOT DETECTED NOT DETECTED Final   Mycoplasma pneumoniae NOT DETECTED NOT DETECTED Final    Comment: Performed at [facility], [address], [place], [place] [address]  Expectorated Sputum Assessment w Gram Stain, Rflx to Resp Cult     Status: None   Collection Time: 01/25/24  7:06 AM   Specimen: Sputum  Result Value Ref Range Status   Specimen Description SPUTUM  Final   Special Requests NONE   Final   Sputum evaluation   Final    Sputum specimen not acceptable for testing.  Please recollect.   Gram Stain Report Called to,Read Back By and Verified With: RN DOROTHA FOY [phone] @ [phone] FH Performed at [facility], [address], [place], [place] [address]    Report Status 01/25/2024 FINAL  Final  Culture, blood (Routine X 2) w Reflex to ID Panel     Status: None (Preliminary result)   Collection Time: 01/25/24  9:09 AM   Specimen: BLOOD RIGHT HAND  Result Value Ref Range Status   Specimen Description BLOOD RIGHT HAND  Final   Special Requests   Final    BOTTLES DRAWN AEROBIC AND ANAEROBIC Blood Culture results may not be optimal due to an inadequate volume of blood received in culture bottles   Culture   Final    NO GROWTH < 24 HOURS Performed at [facility], [address], [place], [place] [address]    Report Status PENDING  Incomplete  Culture, blood (Routine X 2) w Reflex to ID Panel     Status: None (Preliminary result)   Collection Time: 01/25/24  9:10 AM   Specimen: BLOOD RIGHT ARM  Result Value Ref Range Status   Specimen Description BLOOD RIGHT ARM  Final   Special Requests   Final    BOTTLES DRAWN AEROBIC AND ANAEROBIC Blood Culture results may not be optimal due to an inadequate volume of blood received in culture bottles   Culture   Final    NO GROWTH < 24 HOURS Performed at [facility], [address] [place], [place] [address]    Report Status PENDING  Incomplete  MRSA Next Gen by PCR, Nasal     Status: None   Collection Time: 01/25/24  9:25 AM   Specimen: Nasal Mucosa; Nasal Swab  Result Value Ref Range Status   MRSA by PCR Next Gen NOT DETECTED NOT DETECTED Final    Comment: (NOTE) The GeneXpert MRSA Assay (FDA approved for NASAL specimens only), is one component of a comprehensive MRSA colonization surveillance program. It is not intended to diagnose MRSA infection nor to guide or monitor treatment for MRSA infections. Test  performance is not FDA approved in patients less than 47 years old. Performed at [facility], [address], [place], [place] [address]     RADIOLOGY STUDIES/RESULTS: CT CHEST WO CONTRAST Result Date: 01/25/2024 CLINICAL DATA:  Pneumonia complication suspected. EXAM: CT CHEST WITHOUT CONTRAST TECHNIQUE: Multidetector CT imaging of the chest was performed following the standard protocol without IV contrast. RADIATION DOSE  REDUCTION: This exam was performed according to the departmental dose-optimization program which includes automated exposure control, adjustment of the mA and/or kV according to patient size and/or use of iterative reconstruction technique. COMPARISON:  Chest radiograph dated 01/25/2024. FINDINGS: Evaluation of this exam is limited in the absence of intravenous contrast. Cardiovascular: There is no cardiomegaly or pericardial effusion. The thoracic aorta and the central pulmonary arteries are grossly unremarkable on this noncontrast CT. Mediastinum/Nodes: Mildly enlarged right hilar and mediastinal lymph nodes measure up to 15 mm in the subcarinal region and 14 mm anterior to the trachea. The esophagus is grossly unremarkable. No mediastinal fluid collection. Lungs/Pleura: Large area of consolidation in the right lower lobe most consistent with pneumonia. Clinical correlation and follow-up to resolution recommended. Subsegmental density in the right middle lobe along the fissure also atelectasis or infiltrate. Faint areas of ground-glass density in the left upper lobe and to a lesser degree left lower lobe noted. No pleural effusion pneumothorax. Mild paraseptal emphysema. The central airways are patent. Upper Abdomen: Small gallstones. Musculoskeletal: No acute osseous pathology. IMPRESSION: 1. Right lower lobe pneumonia. Clinical correlation and follow-up to resolution recommended. 2. Mildly enlarged right hilar and mediastinal lymph nodes, likely reactive. 3. Cholelithiasis. 4.   Emphysema (ICD10-J43.9). Electronically Signed   By: Vanetta Chou M.D.   On: 01/25/2024 16:54   MR Lumbar Spine W Wo Contrast Result Date: 01/25/2024 EXAM: MRI LUMBAR SPINE 01/25/2024 12:35:00 PM TECHNIQUE: Multiplanar multisequence MRI of the lumbar spine was performed with and without the administration of intravenous contrast. COMPARISON: Thoracic MRI 01/25/2024 reported separately. CLINICAL HISTORY: 53 year old female with HIV, pneumonia, and chronic low back pain. FINDINGS: BONES AND ALIGNMENT: Normal lumbar segmentation, concordant with the thoracic spine reported separately. Relatively normal lumbar lordosis. No significant scoliosis or spondylolisthesis. Background bone marrow signal and vertebral height within normal limits. Intact visible sacrum and SI joints. No marrow edema identified. No acute traumatic injury identified in the lumbar spine. No acute or inflammatory process in the lumbar spine. SPINAL CORD: Normal conus medullaris at L1. No abnormal intradural enhancement. No dural thickening. SOFT TISSUES: Evidence of rounded uterine fundal fibroid, at least 6.5 cm diameter on series 1 image 7. Negative visible abdominal viscera. Ordinary subcutaneous edema posterior to the lumbar spine. No paraspinal mass. T12-L1: Negative. L1-L2: Negative. L2-L3: Mild anterior disc bulging, mild to moderate facet hypertrophy. No stenosis. L3-L4: Mild disc desiccation and disc bulging. Mild posterior element hypertrophy. No stenosis. L4-L5: Mild disc desiccation, disc space loss, and circumferential disc bulge. Moderate facet and ligamentum flavum hypertrophy. Mild bilateral L4 neural foraminal stenosis. No lateral recess or spinal stenosis. L5-S1: Negative. IMPRESSION: 1. No acute no inflammatory finding in the lumbar spine. 2. Mild for age lumbar degeneration. No lumbar spinal stenosis or convincing neural impingement. 3. Incidental uterine fundal fibroid. Electronically signed by: Helayne Hurst MD 01/25/2024  01:05 PM EST RP Workstation: [id number]   MR THORACIC SPINE W WO CONTRAST Result Date: 01/25/2024 EXAM: MRI THORACIC SPINE WITH AND WITHOUT INTRAVENOUS CONTRAST 01/25/2024 12:35:00 PM TECHNIQUE: Multiplanar multisequence MRI of the thoracic spine was performed with and without the administration of 10 mL gadobutrol (GADAVIST) 1 MMOL/ML injection. COMPARISON: Prior cervical spine radiographs 02/15/2023. CLINICAL HISTORY: 53 year old female with chronic mid-back pain, HIV, and pneumonia. FINDINGS: Limited sagittal imaging of the cervical spine: Widespread advanced cervical disc and endplate degeneration is evident. Appears similar to abnormal cervical radiographs last year. BONES AND ALIGNMENT: Thoracic spine segmentation appears to be normal. Maintained thoracic vertebral height. Benign T8  vertebral body hemangioma, normal variant. Background bone marrow signal in the thoracic spine is within normal limits. No abnormal marrow enhancement. No marrow edema identified. SPINAL CORD: Thoracic spinal cord appears normal. Conus medullaris at or near the T12-L1 level. No abnormal intradural enhancement. No dural thickening. SOFT TISSUES: Abnormal right lung with evidence of consolidation. No obvious pleural effusion. Grossly negative visible upper abdominal viscera. DEGENERATIVE CHANGES: Partially visible lower cervical spine Degenerative spinal stenosis. But Mild for age thoracic spine degeneration, especially when compared to the cervical. And largely normal for age thoracic intervertebral disc signal and morphology. No thoracic spinal stenosis. IMPRESSION: 1. No acute or inflammatory process identified in the thoracic spine. 2. 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 No thoracic spinal stenosis. 3. Abnormal right lung,  with evidence consolidation. Electronically signed by: Helayne Hurst MD 01/25/2024 12:55 PM EST RP  Workstation: [id number]   DG Chest Port 1 View Result Date: 01/25/2024 CLINICAL DATA:  Shortness of breath. EXAM: PORTABLE CHEST 1 VIEW COMPARISON:  01/23/2024 FINDINGS: Hazy opacity over the right mid and lower lung is suspicious for airspace disease although superimposed component of soft tissue may accentuate this finding. Patchy airspace disease noted left mid and lower lung. No substantial pleural effusion. The cardio pericardial silhouette is enlarged. Telemetry leads overlie the chest. IMPRESSION: 1. Hazy opacity over the right mid and lower lung is suspicious for airspace disease/pneumonia although superimposed component of soft tissue may accentuate this finding. 2. Patchy airspace disease left mid and lower lung also suspicious for pneumonia. Electronically Signed   By: Camellia Candle M.D.   On: 01/25/2024 08:08     LOS: 3 days   Donalda Applebaum, MD  [facility]    To contact the attending provider between 7A-7P or the covering provider during after hours 7P-7A, please log into the web site [email or web address] and access using universal [place] password for that web site. If you do not have the password, please call the hospital operator.  01/26/2024, 1:33 PM

## 2024-01-26 NOTE — Progress Notes (Signed)
   01/26/24 0924  Mobility  Activity Dangled on edge of bed  Level of Assistance Contact guard assist, steadying assist  Assistive Device None  Range of Motion/Exercises Active;All extremities  Activity Response Tolerated fair  Mobility Referral Yes  Mobility visit 1 Mobility  Mobility Specialist Start Time (ACUTE ONLY) [id number]  Mobility Specialist Stop Time (ACUTE ONLY) 0935  Mobility Specialist Time Calculation (min) (ACUTE ONLY) 11 min   Mobility Specialist: Progress Note  Post-Mobility:    HR 83, SpO2 92% RA  Pt agreeable to mobility session - received in bed. C/o being tired. Returned to bed with all needs met - call bell within reach.   Additional comments: Pt opted for EOB d/t tiredness.   Exercises included (x10) ankle pumps, leg extensions, marches, bicep curls, shoulder shrugs.   Virgle Boards, BS Mobility Specialist Please contact via SecureChat or  Rehab office at [phone].

## 2024-01-26 NOTE — TOC Progression Note (Addendum)
 Transition of Care [facility]) - Progression Note    Patient Details  Name: Paula Martinez MRN: [id number] Date of Birth: [birth year]  [facility] CM/SW Contact  Corean JAYSON Canary, RN Phone Number: 01/26/2024, 1:16 PM  Clinical Narrative:     Met with patient at bedside, she has [facility] already for home health. She states currently she is so hungry and exhausted. Would like a call back later on to discuss. Darleene Gowda from [place] aware of hospitalization. Orders in to resume HH when DC 1530 Spoke to patient again face to face She is requesting a cane, 4 pronged, ordered, will need to clal closer to DC.  The patient states she does not have a PCP, listed as Dr Austin in [place], called office to confirm. She is wanting a pain management referral as well. Discussed that thet is usually done by PCP.  She is still active with Dr Austin appt made for hospital follow up   Expected Discharge Plan: Home/Self Care Barriers to Discharge: Continued Medical Work up               Expected Discharge Plan and Services       Living arrangements for the past 2 months: Single Family Home                                       Social Drivers of Health (SDOH) Interventions SDOH Screenings   Depression (PHQ2-9): Low Risk  (11/20/2023)  Tobacco Use: Medium Risk (01/19/2024)   Received from [facility]    Readmission Risk Interventions     No data to display

## 2024-01-26 NOTE — Plan of Care (Signed)

## 2024-01-27 DIAGNOSIS — A419 Sepsis, unspecified organism: Secondary | ICD-10-CM | POA: Diagnosis not present

## 2024-01-27 DIAGNOSIS — J189 Pneumonia, unspecified organism: Secondary | ICD-10-CM | POA: Diagnosis not present

## 2024-01-27 LAB — GLUCOSE, CAPILLARY
Glucose-Capillary: 95 mg/dL (ref 70–99)
Glucose-Capillary: 104 mg/dL — ABNORMAL HIGH (ref 70–99)
Glucose-Capillary: 163 mg/dL — ABNORMAL HIGH (ref 70–99)
Glucose-Capillary: 99 mg/dL (ref 70–99)
Glucose-Capillary: 116 mg/dL — ABNORMAL HIGH (ref 70–99)

## 2024-01-27 NOTE — Plan of Care (Signed)

## 2024-01-27 NOTE — Progress Notes (Signed)
 PROGRESS NOTE        PATIENT DETAILS Name: Paula Martinez Age: 53 y.o. Sex: female Date of Birth: 06/19/1970 Admit Date: 01/23/2024 Admitting Physician Marsha Ada, MD PCP:Sun, Vyvyan, MD  Brief Summary: Patient is a 53 y.o.  female with history of HIV-on ART, prior PML-s/p right hand/wrist contracture-who presented with 2-3-day history of fever-myalgias-low back pain-dry cough-found to have PNA and subsequently admitted to the hospitalist service.  Hospital course complicated by persistent fever-see below for further details.  Significant events: 11/11>> admit to [facility] 11/13>> persistently febrile-vancomycin started.  MRI T-spine/L-spine negative.  CT chest-confirms RLL PNA without empyema/cavitation/abscess.  Significant studies: 11/11>> CXR: Left> right base opacity 11/13>> MRI L-spine/T-spine: No discitis/osteomyelitis 11/13> CT chest: RLL PNA  Significant microbiology data: 11/11>> COVID/influenza/RSV PCR: Negative 11/11>> blood culture 1/2: Staph hominis 11/12>> blood culture: No growth 11/12>> respiratory virus panel: Negative 11/13>> sputum cultures: Nondiagnostic-specimen unacceptable for testing. 11/13>> urine Legionella antigen: Negative 11/13>> urine streptococcal antigen: Negative. 11/13>> blood culture: No growth 11/14>> sputum culture: Nondiagnostic-specimen not acceptable for testing.  Procedures: None  Consults: ID  Subjective: Feels overall better-had 1 episode of fever yesterday-fever curve definitely better.  No other complaints.  Objective: Vitals: Blood pressure 93/62, pulse 82, temperature 98.3 F (36.8 C), temperature source Oral, resp. rate 19, height 5' 7 (1.702 m), weight 90.7 kg, SpO2 92%.   Exam: Gen Exam:Alert awake-not in any distress HEENT:atraumatic, normocephalic Chest: B/L clear to auscultation anteriorly CVS:S1S2 regular Abdomen:soft non tender, non distended Extremities:no edema Neurology: Non  focal Skin: no rash  Pertinent Labs/Radiology:    Latest Ref Rng & Units 01/26/2024    2:14 AM 01/25/2024    3:37 AM 01/24/2024    8:01 AM  CBC  WBC 4.0 - 10.5 K/uL 6.3  9.2  10.4   Hemoglobin 12.0 - 15.0 g/dL 89.4  88.3  87.1   Hematocrit 36.0 - 46.0 % 29.4  32.9  36.4   Platelets 150 - 400 K/uL 223  199  223     Lab Results  Component Value Date   NA 139 01/26/2024   K 3.5 01/26/2024   CL 110 01/26/2024   CO2 19 (L) 01/26/2024      Assessment/Plan: Sepsis secondary to PNA (POA) Fever curve definitely better-1 episode of fever yesterday-afebrile this morning Now just on cefepime ID following and managing antibiotic regimen. All culture data negative so far.  Back pain Mostly in lower back-but slightly better today. MRI T-spine/L-spine negative discitis/osteomyelitis. UDS positive for cocaine but patient vehemently denies-repeat UDS negative  Staph hominis bacteremia Likely a contaminant Patient does have hardware in right ankle-but exam is benign without any signs of infection. Appreciate ID input-no longer on vancomycin.   HIV Continue ART Viral load undetectable CD4 suppressed-due to acute illness.  History of PML with right wrist contracture Follows with outpatient hand surgery/orthopedic surgery  Mildly enlarged hilar/mediastinal lymph nodes Likely reactive to PNA Incidental finding on CT imaging Will need to repeat CT chest in 4 to 6 weeks to document resolution of lymphadenopathy.  Class 1 Obesity: Estimated body mass index is 31.32 kg/m as calculated from the following:   Height as of this encounter: 5' 7 (1.702 m).   Weight as of this encounter: 90.7 kg.   Code status:   Code Status: Full Code   DVT Prophylaxis: enoxaparin (LOVENOX) injection 40 mg Start: 01/23/24  1800   Family Communication: None at bedside   Disposition Plan: Status is: Inpatient Remains inpatient appropriate because: Severity of illness   Planned Discharge  Destination:Home   Diet: Diet Order             Diet regular Room service appropriate? Yes; Fluid consistency: Thin  Diet effective now                     Antimicrobial agents: Anti-infectives (From admission, onward)    Start     Dose/Rate Route Frequency Ordered Stop   01/26/24 0815  ceFEPIme (MAXIPIME) 2 g in sodium chloride  0.9 % 100 mL IVPB        2 g 200 mL/hr over 30 Minutes Intravenous Every 8 hours 01/26/24 0729     01/26/24 0200  vancomycin (VANCOREADY) IVPB 750 mg/150 mL  Status:  Discontinued        750 mg 150 mL/hr over 60 Minutes Intravenous Every 12 hours 01/25/24 1105 01/26/24 1606   01/25/24 0915  vancomycin (VANCOREADY) IVPB 1500 mg/300 mL        1,500 mg 150 mL/hr over 120 Minutes Intravenous  Once 01/25/24 0825 01/25/24 1336   01/24/24 1000  cefTRIAXone (ROCEPHIN) 2 g in sodium chloride  0.9 % 100 mL IVPB  Status:  Discontinued        2 g 200 mL/hr over 30 Minutes Intravenous Every 24 hours 01/23/24 1202 01/26/24 0729   01/24/24 1000  azithromycin  (ZITHROMAX ) tablet 500 mg        500 mg Oral Daily 01/23/24 1202 01/25/24 0901   01/23/24 1230  bictegravir-emtricitabine -tenofovir  AF (BIKTARVY ) 50-200-25 MG per tablet 1 tablet        1 tablet Oral Daily 01/23/24 1220     01/23/24 0745  azithromycin  (ZITHROMAX ) 500 mg in sodium chloride  0.9 % 250 mL IVPB        500 mg 250 mL/hr over 60 Minutes Intravenous  Once 01/23/24 0737 01/23/24 0924   01/23/24 0700  cefTRIAXone (ROCEPHIN) 2 g in sodium chloride  0.9 % 100 mL IVPB        2 g 200 mL/hr over 30 Minutes Intravenous  Once 01/23/24 0645 01/23/24 0737        MEDICATIONS: Scheduled Meds:  bictegravir-emtricitabine -tenofovir  AF  1 tablet Oral Daily   enoxaparin (LOVENOX) injection  40 mg Subcutaneous Q24H   insulin aspart  0-15 Units Subcutaneous TID AC & HS   polyethylene glycol  17 g Oral Daily   Continuous Infusions:  ceFEPime (MAXIPIME) IV 2 g (01/27/24 0901)   PRN Meds:.acetaminophen ,  HYDROmorphone  (DILAUDID ) injection, hydrOXYzine , ibuprofen, ipratropium-albuterol , LORazepam, ondansetron  (ZOFRAN ) IV, oxyCODONE , senna-docusate   I have personally reviewed following labs and imaging studies  LABORATORY DATA: CBC: Recent Labs  Lab 01/23/24 0653 01/24/24 0801 01/25/24 0337 01/26/24 0214  WBC 13.6* 10.4 9.2 6.3  NEUTROABS 11.9*  --   --   --   HGB 13.8 12.8 11.6* 10.5*  HCT 38.1 36.4 32.9* 29.4*  MCV 79.7* 79.8* 79.9* 79.0*  PLT 210 223 199 223    Basic Metabolic Panel: Recent Labs  Lab 01/23/24 0653 01/23/24 1333 01/23/24 2308 01/24/24 0801 01/25/24 0337 01/26/24 0214  NA 138 135  --  136 135 139  K 2.6* 3.9  --  3.9 3.9 3.5  CL 105 108  --  109 109 110  CO2 18* 16*  --  17* 16* 19*  GLUCOSE 244* 170*  --  184* 98 99  BUN 6  6  --  6 8 5*  CREATININE 0.83 0.83  --  0.77 0.88 0.71  CALCIUM  8.4* 7.8*  --  8.3* 8.1* 8.2*  MG  --   --  2.0  --   --   --     GFR: Estimated Creatinine Clearance: 94 mL/min (by C-G formula based on SCr of 0.71 mg/dL).  Liver Function Tests: Recent Labs  Lab 01/23/24 0653 01/24/24 0801 01/26/24 0214  AST 19 21 44*  ALT 19 19 36  ALKPHOS 76 63 51  BILITOT 1.5* 1.1 1.2  PROT 6.9 6.2* 5.8*  ALBUMIN 2.8* 2.4* 2.0*   No results for input(s): LIPASE, AMYLASE in the last 168 hours. No results for input(s): AMMONIA in the last 168 hours.  Coagulation Profile: Recent Labs  Lab 01/23/24 0653  INR 1.2    Cardiac Enzymes: No results for input(s): CKTOTAL, CKMB, CKMBINDEX, TROPONINI in the last 168 hours.  BNP (last 3 results) No results for input(s): PROBNP in the last 8760 hours.  Lipid Profile: No results for input(s): CHOL, HDL, LDLCALC, TRIG, CHOLHDL, LDLDIRECT in the last 72 hours.  Thyroid Function Tests: No results for input(s): TSH, T4TOTAL, FREET4, T3FREE, THYROIDAB in the last 72 hours.  Anemia Panel: No results for input(s): VITAMINB12, FOLATE, FERRITIN,  TIBC, IRON, RETICCTPCT in the last 72 hours.  Urine analysis:    Component Value Date/Time   COLORURINE AMBER (A) 01/23/2024 0652   APPEARANCEUR HAZY (A) 01/23/2024 0652   LABSPEC 1.022 01/23/2024 0652   PHURINE 5.0 01/23/2024 0652   GLUCOSEU >=500 (A) 01/23/2024 0652   HGBUR MODERATE (A) 01/23/2024 0652   BILIRUBINUR NEGATIVE 01/23/2024 0652   KETONESUR 5 (A) 01/23/2024 0652   PROTEINUR 100 (A) 01/23/2024 0652   NITRITE NEGATIVE 01/23/2024 0652   LEUKOCYTESUR NEGATIVE 01/23/2024 0652    Sepsis Labs: Lactic Acid, Venous    Component Value Date/Time   LATICACIDVEN 1.7 01/23/2024 0658    MICROBIOLOGY: Recent Results (from the past 240 hours)  Blood Culture (routine x 2)     Status: None (Preliminary result)   Collection Time: 01/23/24  6:25 AM   Specimen: BLOOD LEFT WRIST  Result Value Ref Range Status   Specimen Description BLOOD LEFT WRIST  Final   Special Requests   Final    BOTTLES DRAWN AEROBIC ONLY Blood Culture results may not be optimal due to an inadequate volume of blood received in culture bottles   Culture   Final    NO GROWTH 3 DAYS Performed at [facility], [address], [place], [place] [address]    Report Status PENDING  Incomplete  Resp panel by RT-PCR (RSV, Flu A&B, Covid)     Status: None   Collection Time: 01/23/24  6:53 AM   Specimen: Nasal Swab  Result Value Ref Range Status   SARS Coronavirus 2 by RT PCR NEGATIVE NEGATIVE Final   Influenza A by PCR NEGATIVE NEGATIVE Final   Influenza B by PCR NEGATIVE NEGATIVE Final    Comment: (NOTE) The Xpert Xpress SARS-CoV-2/FLU/RSV plus assay is intended as an aid in the diagnosis of influenza from Nasopharyngeal swab specimens and should not be used as a sole basis for treatment. Nasal washings and aspirates are unacceptable for Xpert Xpress SARS-CoV-2/FLU/RSV testing.  Fact Sheet for Patients: bloggercourse.com  Fact Sheet for Healthcare  Providers: seriousbroker.it  This test is not yet approved or cleared by the [place]  FDA and has been authorized for detection and/or diagnosis of SARS-CoV-2 by  FDA under an Emergency Use Authorization (EUA). This EUA will remain in effect (meaning this test can be used) for the duration of the COVID-19 declaration under Section 564(b)(1) of the Act, 21 U.S.C. section 360bbb-3(b)(1), unless the authorization is terminated or revoked.     Resp Syncytial Virus by PCR NEGATIVE NEGATIVE Final    Comment: (NOTE) Fact Sheet for Patients: bloggercourse.com  Fact Sheet for Healthcare Providers: seriousbroker.it  This test is not yet approved or cleared by the [place]  FDA and has been authorized for detection and/or diagnosis of SARS-CoV-2 by FDA under an Emergency Use Authorization (EUA). This EUA will remain in effect (meaning this test can be used) for the duration of the COVID-19 declaration under Section 564(b)(1) of the Act, 21 U.S.C. section 360bbb-3(b)(1), unless the authorization is terminated or revoked.  Performed at [facility], [address], [place], [place] [address]   Blood Culture (routine x 2)     Status: Abnormal   Collection Time: 01/23/24  6:53 AM   Specimen: BLOOD  Result Value Ref Range Status   Specimen Description BLOOD LEFT ANTECUBITAL  Final   Special Requests   Final    BOTTLES DRAWN AEROBIC AND ANAEROBIC Blood Culture results may not be optimal due to an inadequate volume of blood received in culture bottles   Culture  Setup Time   Final    GRAM POSITIVE COCCI IN CLUSTERS AEROBIC BOTTLE ONLY CRITICAL RESULT CALLED TO, READ BACK BY AND VERIFIED WITH: PHARMD G ABBOTT 01/24/2024 @ 0343 BY AB    Culture (A)  Final    STAPHYLOCOCCUS HOMINIS THE SIGNIFICANCE OF ISOLATING THIS ORGANISM FROM A SINGLE SET OF BLOOD CULTURES WHEN MULTIPLE SETS ARE DRAWN IS UNCERTAIN.  PLEASE NOTIFY THE MICROBIOLOGY DEPARTMENT WITHIN ONE WEEK IF SPECIATION AND SENSITIVITIES ARE REQUIRED. Performed at [facility], [address], [place], [place] [address]    Report Status 01/26/2024 FINAL  Final  Respiratory (~20 pathogens) panel by PCR     Status: None   Collection Time: 01/23/24  6:53 AM   Specimen: Nasopharyngeal Swab; Respiratory  Result Value Ref Range Status   Adenovirus NOT DETECTED NOT DETECTED Final   Coronavirus 229E NOT DETECTED NOT DETECTED Final    Comment: (NOTE) The Coronavirus on the Respiratory Panel, DOES NOT test for the novel  Coronavirus (2019 nCoV)    Coronavirus HKU1 NOT DETECTED NOT DETECTED Final   Coronavirus NL63 NOT DETECTED NOT DETECTED Final   Coronavirus OC43 NOT DETECTED NOT DETECTED Final   Metapneumovirus NOT DETECTED NOT DETECTED Final   Rhinovirus / Enterovirus NOT DETECTED NOT DETECTED Final   Influenza A NOT DETECTED NOT DETECTED Final   Influenza B NOT DETECTED NOT DETECTED Final   Parainfluenza Virus 1 NOT DETECTED NOT DETECTED Final   Parainfluenza Virus 2 NOT DETECTED NOT DETECTED Final   Parainfluenza Virus 3 NOT DETECTED NOT DETECTED Final   Parainfluenza Virus 4 NOT DETECTED NOT DETECTED Final   Respiratory Syncytial Virus NOT DETECTED NOT DETECTED Final   Bordetella pertussis NOT DETECTED NOT DETECTED Final   Bordetella Parapertussis NOT DETECTED NOT DETECTED Final   Chlamydophila pneumoniae NOT DETECTED NOT DETECTED Final   Mycoplasma pneumoniae NOT DETECTED NOT DETECTED Final    Comment: Performed at [facility], [address] [place], [place] [address]  Blood Culture ID Panel (Reflexed)     Status: Abnormal   Collection Time: 01/23/24  6:53 AM  Result Value Ref Range Status  Enterococcus faecalis NOT DETECTED NOT DETECTED Final   Enterococcus Faecium NOT DETECTED NOT DETECTED Final   Listeria monocytogenes NOT DETECTED NOT DETECTED Final   Staphylococcus species DETECTED (A) NOT DETECTED Final     Comment: CRITICAL RESULT CALLED TO, READ BACK BY AND VERIFIED WITH: PHARMD G ABBOTT 01/24/2024 @ 0343 BY AB    Staphylococcus aureus (BCID) NOT DETECTED NOT DETECTED Final   Staphylococcus epidermidis NOT DETECTED NOT DETECTED Final   Staphylococcus lugdunensis NOT DETECTED NOT DETECTED Final   Streptococcus species NOT DETECTED NOT DETECTED Final   Streptococcus agalactiae NOT DETECTED NOT DETECTED Final   Streptococcus pneumoniae NOT DETECTED NOT DETECTED Final   Streptococcus pyogenes NOT DETECTED NOT DETECTED Final   A.calcoaceticus-baumannii NOT DETECTED NOT DETECTED Final   Bacteroides fragilis NOT DETECTED NOT DETECTED Final   Enterobacterales NOT DETECTED NOT DETECTED Final   Enterobacter cloacae complex NOT DETECTED NOT DETECTED Final   Escherichia coli NOT DETECTED NOT DETECTED Final   Klebsiella aerogenes NOT DETECTED NOT DETECTED Final   Klebsiella oxytoca NOT DETECTED NOT DETECTED Final   Klebsiella pneumoniae NOT DETECTED NOT DETECTED Final   Proteus species NOT DETECTED NOT DETECTED Final   Salmonella species NOT DETECTED NOT DETECTED Final   Serratia marcescens NOT DETECTED NOT DETECTED Final   Haemophilus influenzae NOT DETECTED NOT DETECTED Final   Neisseria meningitidis NOT DETECTED NOT DETECTED Final   Pseudomonas aeruginosa NOT DETECTED NOT DETECTED Final   Stenotrophomonas maltophilia NOT DETECTED NOT DETECTED Final   Candida albicans NOT DETECTED NOT DETECTED Final   Candida auris NOT DETECTED NOT DETECTED Final   Candida glabrata NOT DETECTED NOT DETECTED Final   Candida krusei NOT DETECTED NOT DETECTED Final   Candida parapsilosis NOT DETECTED NOT DETECTED Final   Candida tropicalis NOT DETECTED NOT DETECTED Final   Cryptococcus neoformans/gattii NOT DETECTED NOT DETECTED Final    Comment: Performed at [facility], [address], [place], [place] [address]  Culture, blood (Routine X 2) w Reflex to ID Panel     Status: None (Preliminary result)    Collection Time: 01/24/24  8:01 AM   Specimen: BLOOD LEFT HAND  Result Value Ref Range Status   Specimen Description BLOOD LEFT HAND  Final   Special Requests   Final    BOTTLES DRAWN AEROBIC AND ANAEROBIC Blood Culture adequate volume   Culture   Final    NO GROWTH 2 DAYS Performed at [facility], [address], [place], [place] [address]    Report Status PENDING  Incomplete  Culture, blood (Routine X 2) w Reflex to ID Panel     Status: None (Preliminary result)   Collection Time: 01/24/24  8:01 AM   Specimen: BLOOD LEFT ARM  Result Value Ref Range Status   Specimen Description BLOOD LEFT ARM  Final   Special Requests   Final    BOTTLES DRAWN AEROBIC AND ANAEROBIC Blood Culture adequate volume   Culture   Final    NO GROWTH 2 DAYS Performed at [facility], [address] [place], [place] [address]    Report Status PENDING  Incomplete  Respiratory (~20 pathogens) panel by PCR     Status: None   Collection Time: 01/24/24  9:51 AM   Specimen: Nasopharyngeal Swab; Respiratory  Result Value Ref Range Status   Adenovirus NOT DETECTED NOT DETECTED Final   Coronavirus 229E NOT DETECTED NOT DETECTED Final    Comment: (NOTE) The Coronavirus on the Respiratory Panel,  DOES NOT test for the novel  Coronavirus (2019 nCoV)    Coronavirus HKU1 NOT DETECTED NOT DETECTED Final   Coronavirus NL63 NOT DETECTED NOT DETECTED Final   Coronavirus OC43 NOT DETECTED NOT DETECTED Final   Metapneumovirus NOT DETECTED NOT DETECTED Final   Rhinovirus / Enterovirus NOT DETECTED NOT DETECTED Final   Influenza A NOT DETECTED NOT DETECTED Final   Influenza B NOT DETECTED NOT DETECTED Final   Parainfluenza Virus 1 NOT DETECTED NOT DETECTED Final   Parainfluenza Virus 2 NOT DETECTED NOT DETECTED Final   Parainfluenza Virus 3 NOT DETECTED NOT DETECTED Final   Parainfluenza Virus 4 NOT DETECTED NOT DETECTED Final   Respiratory Syncytial Virus NOT DETECTED NOT DETECTED Final   Bordetella  pertussis NOT DETECTED NOT DETECTED Final   Bordetella Parapertussis NOT DETECTED NOT DETECTED Final   Chlamydophila pneumoniae NOT DETECTED NOT DETECTED Final   Mycoplasma pneumoniae NOT DETECTED NOT DETECTED Final    Comment: Performed at [facility], [address], [place], [place] [address]  Expectorated Sputum Assessment w Gram Stain, Rflx to Resp Cult     Status: None   Collection Time: 01/25/24  7:06 AM   Specimen: Sputum  Result Value Ref Range Status   Specimen Description SPUTUM  Final   Special Requests NONE  Final   Sputum evaluation   Final    Sputum specimen not acceptable for testing.  Please recollect.   Gram Stain Report Called to,Read Back By and Verified With: RN DOROTHA FOY [phone] @ [phone] FH Performed at [facility], [address], [place], [place] [address]    Report Status 01/25/2024 FINAL  Final  Culture, blood (Routine X 2) w Reflex to ID Panel     Status: None (Preliminary result)   Collection Time: 01/25/24  9:09 AM   Specimen: BLOOD RIGHT HAND  Result Value Ref Range Status   Specimen Description BLOOD RIGHT HAND  Final   Special Requests   Final    BOTTLES DRAWN AEROBIC AND ANAEROBIC Blood Culture results may not be optimal due to an inadequate volume of blood received in culture bottles   Culture   Final    NO GROWTH < 24 HOURS Performed at [facility], [address], [place], [place] [address]    Report Status PENDING  Incomplete  Culture, blood (Routine X 2) w Reflex to ID Panel     Status: None (Preliminary result)   Collection Time: 01/25/24  9:10 AM   Specimen: BLOOD RIGHT ARM  Result Value Ref Range Status   Specimen Description BLOOD RIGHT ARM  Final   Special Requests   Final    BOTTLES DRAWN AEROBIC AND ANAEROBIC Blood Culture results may not be optimal due to an inadequate volume of blood received in culture bottles   Culture   Final    NO GROWTH < 24 HOURS Performed at [facility], [address],  [place], [place] [address]    Report Status PENDING  Incomplete  MRSA Next Gen by PCR, Nasal     Status: None   Collection Time: 01/25/24  9:25 AM   Specimen: Nasal Mucosa; Nasal Swab  Result Value Ref Range Status   MRSA by PCR Next Gen NOT DETECTED NOT DETECTED Final    Comment: (NOTE) The GeneXpert MRSA Assay (FDA approved for NASAL specimens only), is one component of a comprehensive MRSA colonization surveillance program. It is not intended to diagnose MRSA infection nor  to guide or monitor treatment for MRSA infections. Test performance is not FDA approved in patients less than 29 years old. Performed at [facility], [address] [place], [place] [address]   Expectorated Sputum Assessment w Gram Stain, Rflx to Resp Cult     Status: None   Collection Time: 01/26/24  3:12 PM   Specimen: Expectorated Sputum  Result Value Ref Range Status   Specimen Description EXPECTORATED SPUTUM  Final   Special Requests NONE  Final   Sputum evaluation   Final    Sputum specimen not acceptable for testing.  Please recollect.   NOTIFIED RN [facility] ON 01/26/24 @ 1715 BY DRT Performed at [facility], [address] [place], [place] [address]    Report Status 01/26/2024 FINAL  Final    RADIOLOGY STUDIES/RESULTS: CT CHEST WO CONTRAST Result Date: 01/25/2024 CLINICAL DATA:  Pneumonia complication suspected. EXAM: CT CHEST WITHOUT CONTRAST TECHNIQUE: Multidetector CT imaging of the chest was performed following the standard protocol without IV contrast. RADIATION DOSE REDUCTION: This exam was performed according to the departmental dose-optimization program which includes automated exposure control, adjustment of the mA and/or kV according to patient size and/or use of iterative reconstruction technique. COMPARISON:  Chest radiograph dated 01/25/2024. FINDINGS: Evaluation of this exam is limited in the absence of intravenous contrast. Cardiovascular: There is no cardiomegaly or pericardial  effusion. The thoracic aorta and the central pulmonary arteries are grossly unremarkable on this noncontrast CT. Mediastinum/Nodes: Mildly enlarged right hilar and mediastinal lymph nodes measure up to 15 mm in the subcarinal region and 14 mm anterior to the trachea. The esophagus is grossly unremarkable. No mediastinal fluid collection. Lungs/Pleura: Large area of consolidation in the right lower lobe most consistent with pneumonia. Clinical correlation and follow-up to resolution recommended. Subsegmental density in the right middle lobe along the fissure also atelectasis or infiltrate. Faint areas of ground-glass density in the left upper lobe and to a lesser degree left lower lobe noted. No pleural effusion pneumothorax. Mild paraseptal emphysema. The central airways are patent. Upper Abdomen: Small gallstones. Musculoskeletal: No acute osseous pathology. IMPRESSION: 1. Right lower lobe pneumonia. Clinical correlation and follow-up to resolution recommended. 2. Mildly enlarged right hilar and mediastinal lymph nodes, likely reactive. 3. Cholelithiasis. 4.  Emphysema (ICD10-J43.9). Electronically Signed   By: Vanetta Chou M.D.   On: 01/25/2024 16:54   MR Lumbar Spine W Wo Contrast Result Date: 01/25/2024 EXAM: MRI LUMBAR SPINE 01/25/2024 12:35:00 PM TECHNIQUE: Multiplanar multisequence MRI of the lumbar spine was performed with and without the administration of intravenous contrast. COMPARISON: Thoracic MRI 01/25/2024 reported separately. CLINICAL HISTORY: 53 year old female with HIV, pneumonia, and chronic low back pain. FINDINGS: BONES AND ALIGNMENT: Normal lumbar segmentation, concordant with the thoracic spine reported separately. Relatively normal lumbar lordosis. No significant scoliosis or spondylolisthesis. Background bone marrow signal and vertebral height within normal limits. Intact visible sacrum and SI joints. No marrow edema identified. No acute traumatic injury identified in the lumbar  spine. No acute or inflammatory process in the lumbar spine. SPINAL CORD: Normal conus medullaris at L1. No abnormal intradural enhancement. No dural thickening. SOFT TISSUES: Evidence of rounded uterine fundal fibroid, at least 6.5 cm diameter on series 1 image 7. Negative visible abdominal viscera. Ordinary subcutaneous edema posterior to the lumbar spine. No paraspinal mass. T12-L1: Negative. L1-L2: Negative. L2-L3: Mild anterior disc bulging, mild to moderate facet hypertrophy. No stenosis. L3-L4: Mild disc desiccation and disc bulging. Mild posterior element hypertrophy. No  stenosis. L4-L5: Mild disc desiccation, disc space loss, and circumferential disc bulge. Moderate facet and ligamentum flavum hypertrophy. Mild bilateral L4 neural foraminal stenosis. No lateral recess or spinal stenosis. L5-S1: Negative. IMPRESSION: 1. No acute no inflammatory finding in the lumbar spine. 2. Mild for age lumbar degeneration. No lumbar spinal stenosis or convincing neural impingement. 3. Incidental uterine fundal fibroid. Electronically signed by: Helayne Hurst MD 01/25/2024 01:05 PM EST RP Workstation: [id number]   MR THORACIC SPINE W WO CONTRAST Result Date: 01/25/2024 EXAM: MRI THORACIC SPINE WITH AND WITHOUT INTRAVENOUS CONTRAST 01/25/2024 12:35:00 PM TECHNIQUE: Multiplanar multisequence MRI of the thoracic spine was performed with and without the administration of 10 mL gadobutrol (GADAVIST) 1 MMOL/ML injection. COMPARISON: Prior cervical spine radiographs 02/15/2023. CLINICAL HISTORY: 53 year old female with chronic mid-back pain, HIV, and pneumonia. FINDINGS: Limited sagittal imaging of the cervical spine: Widespread advanced cervical disc and endplate degeneration is evident. Appears similar to abnormal cervical radiographs last year. BONES AND ALIGNMENT: Thoracic spine segmentation appears to be normal. Maintained thoracic vertebral height. Benign T8 vertebral body hemangioma, normal variant. Background bone  marrow signal in the thoracic spine is within normal limits. No abnormal marrow enhancement. No marrow edema identified. SPINAL CORD: Thoracic spinal cord appears normal. Conus medullaris at or near the T12-L1 level. No abnormal intradural enhancement. No dural thickening. SOFT TISSUES: Abnormal right lung with evidence of consolidation. No obvious pleural effusion. Grossly negative visible upper abdominal viscera. DEGENERATIVE CHANGES: Partially visible lower cervical spine Degenerative spinal stenosis. But Mild for age thoracic spine degeneration, especially when compared to the cervical. And largely normal for age thoracic intervertebral disc signal and morphology. No thoracic spinal stenosis. IMPRESSION: 1. No acute or inflammatory process identified in the thoracic spine. 2. 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 No thoracic spinal stenosis. 3. Abnormal right lung,  with evidence consolidation. Electronically signed by: Helayne Hurst MD 01/25/2024 12:55 PM EST RP Workstation: [id number]     LOS: 4 days   Donalda Applebaum, MD  [facility]    To contact the attending provider between 7A-7P or the covering provider during after hours 7P-7A, please log into the web site [email or web address] and access using universal  password for that web site. If you do not have the password, please call the hospital operator.  01/27/2024, 10:09 AM

## 2024-01-27 NOTE — Plan of Care (Signed)

## 2024-01-28 DIAGNOSIS — J189 Pneumonia, unspecified organism: Secondary | ICD-10-CM | POA: Diagnosis not present

## 2024-01-28 LAB — CBC
HCT: 28.2 % — ABNORMAL LOW (ref 36.0–46.0)
Hemoglobin: 10.3 g/dL — ABNORMAL LOW (ref 12.0–15.0)
MCH: 28.3 pg (ref 26.0–34.0)
MCHC: 36.5 g/dL — ABNORMAL HIGH (ref 30.0–36.0)
MCV: 77.5 fL — ABNORMAL LOW (ref 80.0–100.0)
Platelets: 348 K/uL (ref 150–400)
RBC: 3.64 MIL/uL — ABNORMAL LOW (ref 3.87–5.11)
RDW: 13.3 % (ref 11.5–15.5)
WBC: 5.9 K/uL (ref 4.0–10.5)
nRBC: 0 % (ref 0.0–0.2)

## 2024-01-28 LAB — CULTURE, BLOOD (ROUTINE X 2): Culture: NO GROWTH

## 2024-01-28 LAB — BASIC METABOLIC PANEL WITH GFR
Anion gap: 9 (ref 5–15)
BUN: 6 mg/dL (ref 6–20)
CO2: 21 mmol/L — ABNORMAL LOW (ref 22–32)
Calcium: 8.2 mg/dL — ABNORMAL LOW (ref 8.9–10.3)
Chloride: 110 mmol/L (ref 98–111)
Creatinine, Ser: 0.66 mg/dL (ref 0.44–1.00)
GFR, Estimated: >60 mL/min (ref >60)
Glucose, Bld: 103 mg/dL — ABNORMAL HIGH (ref 70–99)
Potassium: 3.6 mmol/L (ref 3.5–5.1)
Sodium: 140 mmol/L (ref 135–145)

## 2024-01-28 LAB — GLUCOSE, CAPILLARY
Glucose-Capillary: 106 mg/dL — ABNORMAL HIGH (ref 70–99)
Glucose-Capillary: 98 mg/dL (ref 70–99)
Glucose-Capillary: 145 mg/dL — ABNORMAL HIGH (ref 70–99)
Glucose-Capillary: 109 mg/dL — ABNORMAL HIGH (ref 70–99)

## 2024-01-28 LAB — OSMOLALITY: Osmolality: 296 mosm/kg — ABNORMAL HIGH (ref 275–295)

## 2024-01-28 LAB — URIC ACID: Uric Acid, Serum: 3.4 mg/dL (ref 2.5–7.1)

## 2024-01-28 MED ORDER — POTASSIUM CHLORIDE CRYS ER 20 MEQ PO TBCR
40.0000 meq | EXTENDED_RELEASE_TABLET | Freq: Once | ORAL | Status: AC
  Administered 2024-01-28: 40 meq via ORAL
  Filled 2024-01-28: qty 2

## 2024-01-28 MED ORDER — LACTATED RINGERS IV SOLN: INTRAVENOUS | Status: AC

## 2024-01-28 NOTE — Plan of Care (Signed)
  Problem: Coping: Goal: Level of anxiety will decrease Outcome: Not Progressing   

## 2024-01-28 NOTE — Progress Notes (Signed)
 Pt on the phone when I walked in to administer medication. Immediately became agitated after hanging up stating that her back is hurting and she has been waiting 6 hours for pain medication. Nurse in room multiple times prior to medication administration including at shift change/during shift report, to clean room, and to address IV alarm/stop IV fluids.   I asked for 2 patient identifiers and patient stated its on the board and you already scanned my bracelet so you know my name and birthday. I attempted to explain why I needed identifiers and patient became more agitated stated no one else is asking me so why are you? You are trying to be the big dog but if you want, I'll show you who the big dog is. She then began praying asking God to protect me because I didn't know who I was messing with or what I was doing.   I again asked the patient to identify herself, which she did. While attempting to tell her which medications I was giving, she continued to [facility] me and began praying again, asking God to give her the strength because I was the enemy coming to steal her peace.   I repeated which medications I was giving her and the doses then let her know that I was sitting her medications on the table in front of her. She began stating, that since I was the big dog I should know that I was supposed to watch her put each medication in her mouth. I informed her that I was watching and that when she finished I would complete my assessment. She told me that she was going to tell me like she has told everyone else since she's been here that I didn't want to mess with her. She then asked me to turn off her light and leave her room, which I did.   Pt in no apparent distress. VSS.   Charge nurse aware.

## 2024-01-28 NOTE — Progress Notes (Signed)
 PROGRESS NOTE        PATIENT DETAILS Name: Paula Martinez Age: 53 y.o. Sex: female Date of Birth: 07-15-1970 Admit Date: 01/23/2024 Admitting Physician Marsha Ada, MD PCP:Sun, Vyvyan, MD  Brief Summary: Patient is a 53 y.o.  female with history of HIV-on ART, prior PML-s/p right hand/wrist contracture-who presented with 2-3-day history of fever-myalgias-low back pain-dry cough-found to have PNA and subsequently admitted to the hospitalist service.  Hospital course complicated by persistent fever-see below for further details.  Significant events: 11/11>> admit to [facility] 11/13>> persistently febrile-vancomycin started.  MRI T-spine/L-spine negative.  CT chest-confirms RLL PNA without empyema/cavitation/abscess.  Significant studies: 11/11>> CXR: Left> right base opacity 11/13>> MRI L-spine/T-spine: No discitis/osteomyelitis 11/13> CT chest: RLL PNA  Significant microbiology data: 11/11>> COVID/influenza/RSV PCR: Negative 11/11>> blood culture 1/2: Staph hominis 11/12>> blood culture: No growth 11/12>> respiratory virus panel: Negative 11/13>> sputum cultures: Nondiagnostic-specimen unacceptable for testing. 11/13>> urine Legionella antigen: Negative 11/13>> urine streptococcal antigen: Negative. 11/13>> blood culture: No growth 11/14>> sputum culture: Nondiagnostic-specimen not acceptable for testing.  Procedures: None  Consults: ID  Subjective:  Patient in bed, appears comfortable, denies any headache, no fever, no chest pain or pressure, proved cough, back pain has improved as well.  Overall continues to feel better gradually.  Objective: Vitals: Blood pressure 133/81, pulse 70, temperature 98.2 F (36.8 C), temperature source Oral, resp. rate 16, height 5' 7 (1.702 m), weight 90.7 kg, SpO2 99%.   Exam:  Awake Alert, No new F.N deficits, Normal affect [place].AT,PERRAL Supple Neck, No JVD,   Symmetrical Chest wall movement, Good air movement  bilaterally, CTAB RRR,No Gallops, Rubs or new Murmurs,  +ve B.Sounds, Abd Soft, No tenderness,   No Cyanosis, Clubbing or edema    Assessment/Plan: Sepsis secondary to PNA (POA) Fever curve definitely better-1 episode of fever yesterday-afebrile this morning Now just on cefepime ID following and managing antibiotic regimen. All culture data negative so far, complaining of mild dysuria for which he UA along with culture and sensitivity ordered.  Back pain Mostly in lower back-but slightly better today. MRI T-spine/L-spine negative discitis/osteomyelitis. UDS positive for cocaine but patient vehemently denies-repeat UDS negative  Staph hominis bacteremia Likely a contaminant Patient does have hardware in right ankle-but exam is benign without any signs of infection. Appreciate ID input-no longer on vancomycin.   HIV Continue ART Viral load undetectable CD4 suppressed-due to acute illness.  History of PML with right wrist contracture Follows with outpatient hand surgery/orthopedic surgery  Mildly enlarged hilar/mediastinal lymph nodes Likely reactive to PNA Incidental finding on CT imaging Will need to repeat CT chest in 4 to 6 weeks to document resolution of lymphadenopathy.  Class 1 Obesity: Estimated body mass index is 31.32 kg/m as calculated from the following:   Height as of this encounter: 5' 7 (1.702 m).   Weight as of this encounter: 90.7 kg.   Code status:   Code Status: Full Code   DVT Prophylaxis: enoxaparin (LOVENOX) injection 40 mg Start: 01/23/24 1800   Family Communication: None at bedside   Disposition Plan: Status is: Inpatient Remains inpatient appropriate because: Severity of illness   Planned Discharge Destination:Home   Diet: Diet Order             Diet regular Room service appropriate? Yes; Fluid consistency: Thin  Diet effective now  MEDICATIONS: Scheduled Meds:  bictegravir-emtricitabine -tenofovir  AF  1  tablet Oral Daily   enoxaparin (LOVENOX) injection  40 mg Subcutaneous Q24H   insulin aspart  0-15 Units Subcutaneous TID AC & HS   polyethylene glycol  17 g Oral Daily   potassium chloride  40 mEq Oral Once   Continuous Infusions:  ceFEPime (MAXIPIME) IV 2 g (01/28/24 0008)   lactated ringers      PRN Meds:.acetaminophen , HYDROmorphone  (DILAUDID ) injection, hydrOXYzine , ibuprofen, ipratropium-albuterol , LORazepam, ondansetron  (ZOFRAN ) IV, oxyCODONE , senna-docusate   I have personally reviewed following labs and imaging studies  LABORATORY DATA: CBC: Recent Labs  Lab 01/23/24 0653 01/24/24 0801 01/25/24 0337 01/26/24 0214 01/28/24 0448  WBC 13.6* 10.4 9.2 6.3 5.9  NEUTROABS 11.9*  --   --   --   --   HGB 13.8 12.8 11.6* 10.5* 10.3*  HCT 38.1 36.4 32.9* 29.4* 28.2*  MCV 79.7* 79.8* 79.9* 79.0* 77.5*  PLT 210 223 199 223 348    Basic Metabolic Panel: Recent Labs  Lab 01/23/24 1333 01/23/24 2308 01/24/24 0801 01/25/24 0337 01/26/24 0214 01/28/24 0448  NA 135  --  136 135 139 140  K 3.9  --  3.9 3.9 3.5 3.6  CL 108  --  109 109 110 110  CO2 16*  --  17* 16* 19* 21*  GLUCOSE 170*  --  184* 98 99 103*  BUN 6  --  6 8 5* 6  CREATININE 0.83  --  0.77 0.88 0.71 0.66  CALCIUM  7.8*  --  8.3* 8.1* 8.2* 8.2*  MG  --  2.0  --   --   --   --     GFR: Estimated Creatinine Clearance: 94 mL/min (by C-G formula based on SCr of 0.66 mg/dL).  Liver Function Tests: Recent Labs  Lab 01/23/24 0653 01/24/24 0801 01/26/24 0214  AST 19 21 44*  ALT 19 19 36  ALKPHOS 76 63 51  BILITOT 1.5* 1.1 1.2  PROT 6.9 6.2* 5.8*  ALBUMIN 2.8* 2.4* 2.0*   No results for input(s): LIPASE, AMYLASE in the last 168 hours. No results for input(s): AMMONIA in the last 168 hours.  Coagulation Profile: Recent Labs  Lab 01/23/24 0653  INR 1.2    Cardiac Enzymes: No results for input(s): CKTOTAL, CKMB, CKMBINDEX, TROPONINI in the last 168 hours.  BNP (last 3 results) No  results for input(s): PROBNP in the last 8760 hours.  Lipid Profile: No results for input(s): CHOL, HDL, LDLCALC, TRIG, CHOLHDL, LDLDIRECT in the last 72 hours.  Thyroid Function Tests: No results for input(s): TSH, T4TOTAL, FREET4, T3FREE, THYROIDAB in the last 72 hours.  Anemia Panel: No results for input(s): VITAMINB12, FOLATE, FERRITIN, TIBC, IRON, RETICCTPCT in the last 72 hours.  Urine analysis:    Component Value Date/Time   COLORURINE AMBER (A) 01/23/2024 0652   APPEARANCEUR HAZY (A) 01/23/2024 0652   LABSPEC 1.022 01/23/2024 0652   PHURINE 5.0 01/23/2024 0652   GLUCOSEU >=500 (A) 01/23/2024 0652   HGBUR MODERATE (A) 01/23/2024 0652   BILIRUBINUR NEGATIVE 01/23/2024 0652   KETONESUR 5 (A) 01/23/2024 0652   PROTEINUR 100 (A) 01/23/2024 0652   NITRITE NEGATIVE 01/23/2024 0652   LEUKOCYTESUR NEGATIVE 01/23/2024 0652    Sepsis Labs: Lactic Acid, Venous    Component Value Date/Time   LATICACIDVEN 1.7 01/23/2024 0658    MICROBIOLOGY: Recent Results (from the past 240 hours)  Blood Culture (routine x 2)     Status: None   Collection Time: 01/23/24  6:25 AM   Specimen: BLOOD LEFT WRIST  Result Value Ref Range Status   Specimen Description BLOOD LEFT WRIST  Final   Special Requests   Final    BOTTLES DRAWN AEROBIC ONLY Blood Culture results may not be optimal due to an inadequate volume of blood received in culture bottles   Culture   Final    NO GROWTH 5 DAYS Performed at [facility], [address], [place], [place] [address]    Report Status 01/28/2024 FINAL  Final  Resp panel by RT-PCR (RSV, Flu A&B, Covid)     Status: None   Collection Time: 01/23/24  6:53 AM   Specimen: Nasal Swab  Result Value Ref Range Status   SARS Coronavirus 2 by RT PCR NEGATIVE NEGATIVE Final   Influenza A by PCR NEGATIVE NEGATIVE Final   Influenza B by PCR NEGATIVE NEGATIVE Final    Comment: (NOTE) The Xpert Xpress SARS-CoV-2/FLU/RSV plus  assay is intended as an aid in the diagnosis of influenza from Nasopharyngeal swab specimens and should not be used as a sole basis for treatment. Nasal washings and aspirates are unacceptable for Xpert Xpress SARS-CoV-2/FLU/RSV testing.  Fact Sheet for Patients: bloggercourse.com  Fact Sheet for Healthcare Providers: seriousbroker.it  This test is not yet approved or cleared by the [place]  FDA and has been authorized for detection and/or diagnosis of SARS-CoV-2 by FDA under an Emergency Use Authorization (EUA). This EUA will remain in effect (meaning this test can be used) for the duration of the COVID-19 declaration under Section 564(b)(1) of the Act, 21 U.S.C. section 360bbb-3(b)(1), unless the authorization is terminated or revoked.     Resp Syncytial Virus by PCR NEGATIVE NEGATIVE Final    Comment: (NOTE) Fact Sheet for Patients: bloggercourse.com  Fact Sheet for Healthcare Providers: seriousbroker.it  This test is not yet approved or cleared by the [place]  FDA and has been authorized for detection and/or diagnosis of SARS-CoV-2 by FDA under an Emergency Use Authorization (EUA). This EUA will remain in effect (meaning this test can be used) for the duration of the COVID-19 declaration under Section 564(b)(1) of the Act, 21 U.S.C. section 360bbb-3(b)(1), unless the authorization is terminated or revoked.  Performed at [facility], [address] [place], [place] [address]   Blood Culture (routine x 2)     Status: Abnormal   Collection Time: 01/23/24  6:53 AM   Specimen: BLOOD  Result Value Ref Range Status   Specimen Description BLOOD LEFT ANTECUBITAL  Final   Special Requests   Final    BOTTLES DRAWN AEROBIC AND ANAEROBIC Blood Culture results may not be optimal due to an inadequate volume of blood received in culture bottles   Culture  Setup Time    Final    GRAM POSITIVE COCCI IN CLUSTERS AEROBIC BOTTLE ONLY CRITICAL RESULT CALLED TO, READ BACK BY AND VERIFIED WITH: PHARMD G ABBOTT 01/24/2024 @ 0343 BY AB    Culture (A)  Final    STAPHYLOCOCCUS HOMINIS THE SIGNIFICANCE OF ISOLATING THIS ORGANISM FROM A SINGLE SET OF BLOOD CULTURES WHEN MULTIPLE SETS ARE DRAWN IS UNCERTAIN. PLEASE NOTIFY THE MICROBIOLOGY DEPARTMENT WITHIN ONE WEEK IF SPECIATION AND SENSITIVITIES ARE REQUIRED. Performed at [facility], [address], [place], [place] [address]    Report Status 01/26/2024 FINAL  Final  Respiratory (~20 pathogens) panel by PCR     Status: None   Collection Time: 01/23/24  6:53 AM   Specimen: Nasopharyngeal Swab; Respiratory  Result Value Ref Range Status   Adenovirus NOT DETECTED NOT DETECTED Final   Coronavirus 229E NOT DETECTED NOT DETECTED Final    Comment: (NOTE) The Coronavirus on the Respiratory Panel, DOES NOT test for the novel  Coronavirus (2019 nCoV)    Coronavirus HKU1 NOT DETECTED NOT DETECTED Final   Coronavirus NL63 NOT DETECTED NOT DETECTED Final   Coronavirus OC43 NOT DETECTED NOT DETECTED Final   Metapneumovirus NOT DETECTED NOT DETECTED Final   Rhinovirus / Enterovirus NOT DETECTED NOT DETECTED Final   Influenza A NOT DETECTED NOT DETECTED Final   Influenza B NOT DETECTED NOT DETECTED Final   Parainfluenza Virus 1 NOT DETECTED NOT DETECTED Final   Parainfluenza Virus 2 NOT DETECTED NOT DETECTED Final   Parainfluenza Virus 3 NOT DETECTED NOT DETECTED Final   Parainfluenza Virus 4 NOT DETECTED NOT DETECTED Final   Respiratory Syncytial Virus NOT DETECTED NOT DETECTED Final   Bordetella pertussis NOT DETECTED NOT DETECTED Final   Bordetella Parapertussis NOT DETECTED NOT DETECTED Final   Chlamydophila pneumoniae NOT DETECTED NOT DETECTED Final   Mycoplasma pneumoniae NOT DETECTED NOT DETECTED Final    Comment: Performed at [facility], [address] [place], [place] [address]  Blood Culture ID  Panel (Reflexed)     Status: Abnormal   Collection Time: 01/23/24  6:53 AM  Result Value Ref Range Status   Enterococcus faecalis NOT DETECTED NOT DETECTED Final   Enterococcus Faecium NOT DETECTED NOT DETECTED Final   Listeria monocytogenes NOT DETECTED NOT DETECTED Final   Staphylococcus species DETECTED (A) NOT DETECTED Final    Comment: CRITICAL RESULT CALLED TO, READ BACK BY AND VERIFIED WITH: PHARMD G ABBOTT 01/24/2024 @ 0343 BY AB    Staphylococcus aureus (BCID) NOT DETECTED NOT DETECTED Final   Staphylococcus epidermidis NOT DETECTED NOT DETECTED Final   Staphylococcus lugdunensis NOT DETECTED NOT DETECTED Final   Streptococcus species NOT DETECTED NOT DETECTED Final   Streptococcus agalactiae NOT DETECTED NOT DETECTED Final   Streptococcus pneumoniae NOT DETECTED NOT DETECTED Final   Streptococcus pyogenes NOT DETECTED NOT DETECTED Final   A.calcoaceticus-baumannii NOT DETECTED NOT DETECTED Final   Bacteroides fragilis NOT DETECTED NOT DETECTED Final   Enterobacterales NOT DETECTED NOT DETECTED Final   Enterobacter cloacae complex NOT DETECTED NOT DETECTED Final   Escherichia coli NOT DETECTED NOT DETECTED Final   Klebsiella aerogenes NOT DETECTED NOT DETECTED Final   Klebsiella oxytoca NOT DETECTED NOT DETECTED Final   Klebsiella pneumoniae NOT DETECTED NOT DETECTED Final   Proteus species NOT DETECTED NOT DETECTED Final   Salmonella species NOT DETECTED NOT DETECTED Final   Serratia marcescens NOT DETECTED NOT DETECTED Final   Haemophilus influenzae NOT DETECTED NOT DETECTED Final   Neisseria meningitidis NOT DETECTED NOT DETECTED Final   Pseudomonas aeruginosa NOT DETECTED NOT DETECTED Final   Stenotrophomonas maltophilia NOT DETECTED NOT DETECTED Final   Candida albicans NOT DETECTED NOT DETECTED Final   Candida auris NOT DETECTED NOT DETECTED Final   Candida glabrata NOT DETECTED NOT DETECTED Final   Candida krusei NOT DETECTED NOT DETECTED Final   Candida  parapsilosis NOT DETECTED NOT DETECTED Final   Candida tropicalis NOT DETECTED NOT DETECTED Final   Cryptococcus neoformans/gattii NOT DETECTED NOT DETECTED Final    Comment: Performed at [facility], [address] [place], [place] [address]  Culture, blood (Routine X 2) w Reflex to ID Panel     Status: None (Preliminary result)   Collection Time: 01/24/24  8:01 AM   Specimen: BLOOD LEFT HAND  Result Value Ref Range Status   Specimen Description BLOOD LEFT HAND  Final   Special Requests   Final    BOTTLES DRAWN AEROBIC AND ANAEROBIC Blood Culture adequate volume   Culture   Final    NO GROWTH 4 DAYS Performed at [facility], [address] [place], [place] [address]    Report Status PENDING  Incomplete  Culture, blood (Routine X 2) w Reflex to ID Panel     Status: None (Preliminary result)   Collection Time: 01/24/24  8:01 AM   Specimen: BLOOD LEFT ARM  Result Value Ref Range Status   Specimen Description BLOOD LEFT ARM  Final   Special Requests   Final    BOTTLES DRAWN AEROBIC AND ANAEROBIC Blood Culture adequate volume   Culture   Final    NO GROWTH 4 DAYS Performed at [facility], [address], [place], [place] [address]    Report Status PENDING  Incomplete  Respiratory (~20 pathogens) panel by PCR     Status: None   Collection Time: 01/24/24  9:51 AM   Specimen: Nasopharyngeal Swab; Respiratory  Result Value Ref Range Status   Adenovirus NOT DETECTED NOT DETECTED Final   Coronavirus 229E NOT DETECTED NOT DETECTED Final    Comment: (NOTE) The Coronavirus on the Respiratory Panel, DOES NOT test for the novel  Coronavirus (2019 nCoV)    Coronavirus HKU1 NOT DETECTED NOT DETECTED Final   Coronavirus NL63 NOT DETECTED NOT DETECTED Final   Coronavirus OC43 NOT DETECTED NOT DETECTED Final   Metapneumovirus NOT DETECTED NOT DETECTED Final   Rhinovirus / Enterovirus NOT DETECTED NOT DETECTED Final   Influenza A NOT DETECTED NOT DETECTED Final    Influenza B NOT DETECTED NOT DETECTED Final   Parainfluenza Virus 1 NOT DETECTED NOT DETECTED Final   Parainfluenza Virus 2 NOT DETECTED NOT DETECTED Final   Parainfluenza Virus 3 NOT DETECTED NOT DETECTED Final   Parainfluenza Virus 4 NOT DETECTED NOT DETECTED Final   Respiratory Syncytial Virus NOT DETECTED NOT DETECTED Final   Bordetella pertussis NOT DETECTED NOT DETECTED Final   Bordetella Parapertussis NOT DETECTED NOT DETECTED Final   Chlamydophila pneumoniae NOT DETECTED NOT DETECTED Final   Mycoplasma pneumoniae NOT DETECTED NOT DETECTED Final    Comment: Performed at [facility], [address], [place], [place] [address]  Expectorated Sputum Assessment w Gram Stain, Rflx to Resp Cult     Status: None   Collection Time: 01/25/24  7:06 AM   Specimen: Sputum  Result Value Ref Range Status   Specimen Description SPUTUM  Final   Special Requests NONE  Final   Sputum evaluation   Final    Sputum specimen not acceptable for testing.  Please recollect.   Gram Stain Report Called to,Read Back By and Verified With: RN DOROTHA FOY [phone] @ [phone] FH Performed at [facility], [address] [place], [place] [address]    Report Status 01/25/2024 FINAL  Final  Culture, blood (Routine X 2) w Reflex to ID Panel     Status: None (Preliminary result)   Collection Time: 01/25/24  9:09 AM   Specimen: BLOOD RIGHT HAND  Result Value Ref Range Status   Specimen Description BLOOD RIGHT HAND  Final   Special Requests   Final    BOTTLES DRAWN AEROBIC AND ANAEROBIC Blood Culture results may not be optimal due to an inadequate volume  of blood received in culture bottles   Culture   Final    NO GROWTH 3 DAYS Performed at [facility], [address], [place], [place] [address]    Report Status PENDING  Incomplete  Culture, blood (Routine X 2) w Reflex to ID Panel     Status: None (Preliminary result)   Collection Time: 01/25/24  9:10 AM   Specimen: BLOOD RIGHT ARM  Result Value  Ref Range Status   Specimen Description BLOOD RIGHT ARM  Final   Special Requests   Final    BOTTLES DRAWN AEROBIC AND ANAEROBIC Blood Culture results may not be optimal due to an inadequate volume of blood received in culture bottles   Culture   Final    NO GROWTH 3 DAYS Performed at [facility], [address] [place], [place] [address]    Report Status PENDING  Incomplete  MRSA Next Gen by PCR, Nasal     Status: None   Collection Time: 01/25/24  9:25 AM   Specimen: Nasal Mucosa; Nasal Swab  Result Value Ref Range Status   MRSA by PCR Next Gen NOT DETECTED NOT DETECTED Final    Comment: (NOTE) The GeneXpert MRSA Assay (FDA approved for NASAL specimens only), is one component of a comprehensive MRSA colonization surveillance program. It is not intended to diagnose MRSA infection nor to guide or monitor treatment for MRSA infections. Test performance is not FDA approved in patients less than 57 years old. Performed at [facility], [address] [place], [place] [address]   Expectorated Sputum Assessment w Gram Stain, Rflx to Resp Cult     Status: None   Collection Time: 01/26/24  3:12 PM   Specimen: Expectorated Sputum  Result Value Ref Range Status   Specimen Description EXPECTORATED SPUTUM  Final   Special Requests NONE  Final   Sputum evaluation   Final    Sputum specimen not acceptable for testing.  Please recollect.   NOTIFIED RN [facility] ON 01/26/24 @ 1715 BY DRT Performed at [facility], [address], [place], [place] [address]    Report Status 01/26/2024 FINAL  Final    RADIOLOGY STUDIES/RESULTS: No results found.    LOS: 5 days   Lavada Stank, MD  [facility]    To contact the attending provider between 7A-7P or the covering provider during after hours 7P-7A, please log into the web site [email or web address] and access using universal [place] password for that web site. If you do not have the password, please call the hospital  operator.  01/28/2024, 9:43 AM

## 2024-01-29 DIAGNOSIS — B20 Human immunodeficiency virus [HIV] disease: Secondary | ICD-10-CM

## 2024-01-29 DIAGNOSIS — J189 Pneumonia, unspecified organism: Secondary | ICD-10-CM | POA: Diagnosis not present

## 2024-01-29 LAB — CBC WITH DIFFERENTIAL/PLATELET
Basophils Absolute: 0.1 K/uL (ref 0.0–0.1)
Basophils Relative: 1 %
Eosinophils Absolute: 0.1 K/uL (ref 0.0–0.5)
Eosinophils Relative: 1 %
HCT: 28.2 % — ABNORMAL LOW (ref 36.0–46.0)
Hemoglobin: 10.2 g/dL — ABNORMAL LOW (ref 12.0–15.0)
Lymphocytes Relative: 35 %
Lymphs Abs: 2.3 K/uL (ref 0.7–4.0)
MCH: 28.4 pg (ref 26.0–34.0)
MCHC: 36.2 g/dL — ABNORMAL HIGH (ref 30.0–36.0)
MCV: 78.6 fL — ABNORMAL LOW (ref 80.0–100.0)
Monocytes Absolute: 0.4 K/uL (ref 0.1–1.0)
Monocytes Relative: 6 %
Neutro Abs: 3.8 K/uL (ref 1.7–7.7)
Neutrophils Relative %: 57 %
Platelets: 418 K/uL — ABNORMAL HIGH (ref 150–400)
RBC: 3.59 MIL/uL — ABNORMAL LOW (ref 3.87–5.11)
RDW: 13.6 % (ref 11.5–15.5)
WBC: 6.7 K/uL (ref 4.0–10.5)
nRBC: 0 % (ref 0.0–0.2)

## 2024-01-29 LAB — BASIC METABOLIC PANEL WITH GFR
Anion gap: 8 (ref 5–15)
BUN: 8 mg/dL (ref 6–20)
CO2: 21 mmol/L — ABNORMAL LOW (ref 22–32)
Calcium: 8.4 mg/dL — ABNORMAL LOW (ref 8.9–10.3)
Chloride: 111 mmol/L (ref 98–111)
Creatinine, Ser: 0.68 mg/dL (ref 0.44–1.00)
GFR, Estimated: >60 mL/min (ref >60)
Glucose, Bld: 107 mg/dL — ABNORMAL HIGH (ref 70–99)
Potassium: 3.7 mmol/L (ref 3.5–5.1)
Sodium: 140 mmol/L (ref 135–145)

## 2024-01-29 LAB — GLUCOSE, CAPILLARY
Glucose-Capillary: 111 mg/dL — ABNORMAL HIGH (ref 70–99)
Glucose-Capillary: 126 mg/dL — ABNORMAL HIGH (ref 70–99)
Glucose-Capillary: 128 mg/dL — ABNORMAL HIGH (ref 70–99)
Glucose-Capillary: 115 mg/dL — ABNORMAL HIGH (ref 70–99)

## 2024-01-29 LAB — CULTURE, BLOOD (ROUTINE X 2)
Culture: NO GROWTH
Culture: NO GROWTH
Special Requests: ADEQUATE
Special Requests: ADEQUATE

## 2024-01-29 LAB — MAGNESIUM: Magnesium: 2 mg/dL (ref 1.7–2.4)

## 2024-01-29 MED ORDER — AMOXICILLIN-POT CLAVULANATE 875-125 MG PO TABS
1.0000 | ORAL_TABLET | Freq: Two times a day (BID) | ORAL | Status: DC
  Administered 2024-01-29: 1 via ORAL
  Filled 2024-01-29: qty 1

## 2024-01-29 NOTE — Progress Notes (Signed)
 Physical Therapy Treatment Patient Details Name: Paula Martinez MRN: [id number] DOB: January 23, 1971 Today's Date: 01/29/2024   History of Present Illness Patient is a 53 y.o.  female who presented with 2-3-day history of fever-myalgias-low back pain-dry cough-found to have PNA.  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    PT Comments  Pt tolerated treatment well today. Trialed quad cane today for ambulation. Pt cued for sequencing and speed with quad cane. Pt again noticably dyspneic during ambulation however VSS. 2 standing rest breaks. Pt mostly CGA however pt impulsively ditched quad cane for last 84ft of ambulation to chair using window sill for stability with Min A. No change in DC/DME recs at this time. PT will continue to follow.      If plan is discharge home, recommend the following: A little help with walking and/or transfers;A little help with bathing/dressing/bathroom;Assist for transportation;Help with stairs or ramp for entrance   Can travel by private vehicle        Equipment Recommendations  Cane    Recommendations for Other Services       Precautions / Restrictions Precautions Precautions: Fall Recall of Precautions/Restrictions: Intact Restrictions Weight Bearing Restrictions Per Provider Order: No     Mobility  Bed Mobility               General bed mobility comments: she was received seated EOB    Transfers Overall transfer level: Needs assistance Equipment used: None Transfers: Sit to/from Stand Sit to Stand: Contact guard assist, From elevated surface           General transfer comment: Pt able to stand to don pants with AD. CGA for safety.    Ambulation/Gait Ambulation/Gait assistance: Contact guard assist, Min assist Gait Distance (Feet): 35 Feet Assistive device: Quad cane, None Gait Pattern/deviations: Antalgic, Decreased stride length, Step-through pattern, Trunk flexed Gait velocity:  decreased     General Gait Details: trialed quad cane today for ambulation. Pt cued for sequencing and speed with quad cane. Pt again noticably dyspneic during ambulation however VSS. 2 standing rest breaks. Pt mostly CGA however pt ditched quad cane for last 23ft of ambulation to chair using window sill for stability with Min A.   Stairs             Wheelchair Mobility     Tilt Bed    Modified Rankin (Stroke Patients Only)       Balance Overall balance assessment: Needs assistance Sitting-balance support: Bilateral upper extremity supported, Feet supported Sitting balance-Leahy Scale: Good     Standing balance support: Single extremity supported, During functional activity Standing balance-Leahy Scale: Poor Standing balance comment: CGA for static standing                            Communication Communication Communication: No apparent difficulties  Cognition Arousal: Alert Behavior During Therapy: Impulsive, WFL for tasks assessed/performed   PT - Cognitive impairments: No apparent impairments                       PT - Cognition Comments: Can be tangetial at times. Slightly impulsive today. Following commands: Intact      Cueing Cueing Techniques: Verbal cues  Exercises      General Comments General comments (skin integrity, edema, etc.): VSS on RA      Pertinent Vitals/Pain Pain Assessment Pain Assessment: Faces Faces Pain Scale: Hurts a little bit Pain  Location: chronic low back pain Pain Descriptors / Indicators: Constant Pain Intervention(s): Monitored during session    Home Living                          Prior Function            PT Goals (current goals can now be found in the care plan section) Progress towards PT goals: Progressing toward goals    Frequency    Min 2X/week      PT Plan      Co-evaluation              AM-PAC PT 6 Clicks Mobility   Outcome Measure  Help needed  turning from your back to your side while in a flat bed without using bedrails?: A Little Help needed moving from lying on your back to sitting on the side of a flat bed without using bedrails?: A Little Help needed moving to and from a bed to a chair (including a wheelchair)?: A Little Help needed standing up from a chair using your arms (e.g., wheelchair or bedside chair)?: A Little Help needed to walk in hospital room?: A Little Help needed climbing 3-5 steps with a railing? : A Little 6 Click Score: 18    End of Session Equipment Utilized During Treatment: Gait belt Activity Tolerance: Patient tolerated treatment well Patient left: in bed;with call bell/phone within reach Nurse Communication: Mobility status PT Visit Diagnosis: Other abnormalities of gait and mobility (R26.89)     Time: 9076-9055 PT Time Calculation (min) (ACUTE ONLY): 21 min  Charges:    $Gait Training: 8-22 mins PT General Charges $$ ACUTE PT VISIT: 1 Visit                     Sueellen NOVAK, PT, DPT Acute Rehab Services [phone]    Fenris Cauble 01/29/2024, 10:37 AM

## 2024-01-29 NOTE — Care Management Important Message (Signed)
 Important Message  Patient Details  Name: Ivyana Locey MRN: [id number] Date of Birth: 04-Sep-1970   Important Message Given:  Yes - Medicare IM  Patient left prior to IM delivery will mail a copy to the patient home address.    Faiz Weber 01/29/2024, 2:36 PM

## 2024-01-29 NOTE — Progress Notes (Signed)
 PROGRESS NOTE        PATIENT DETAILS Name: Paula Martinez Age: 53 y.o. Sex: female Date of Birth: 15-Aug-1970 Admit Date: 01/23/2024 Admitting Physician Marsha Ada, MD PCP:Sun, Vyvyan, MD  Brief Summary: Patient is a 53 y.o.  female with history of HIV-on ART, prior PML-s/p right hand/wrist contracture-who presented with 2-3-day history of fever-myalgias-low back pain-dry cough-found to have PNA and subsequently admitted to the hospitalist service.  Hospital course complicated by persistent fever-see below for further details.  Significant events: 11/11>> admit to [facility] 11/13>> persistently febrile-vancomycin started.  MRI T-spine/L-spine negative.  CT chest-confirms RLL PNA without empyema/cavitation/abscess.  Significant studies: 11/11>> CXR: Left> right base opacity 11/13>> MRI L-spine/T-spine: No discitis/osteomyelitis 11/13> CT chest: RLL PNA  Significant microbiology data: 11/11>> COVID/influenza/RSV PCR: Negative 11/11>> blood culture 1/2: Staph hominis 11/12>> blood culture: No growth 11/12>> respiratory virus panel: Negative 11/13>> sputum cultures: Nondiagnostic-specimen unacceptable for testing. 11/13>> urine Legionella antigen: Negative 11/13>> urine streptococcal antigen: Negative. 11/13>> blood culture: No growth 11/14>> sputum culture: Nondiagnostic-specimen not acceptable for testing.  Procedures: None  Consults: ID  Subjective: Patient in bed, appears comfortable, denies any headache, no fever, no chest pain or pressure, no shortness of breath , no abdominal pain. No focal weakness.  Overall feels a lot better this morning.  Objective: Vitals: Blood pressure (!) 140/61, pulse 60, temperature 98.4 F (36.9 C), temperature source Oral, resp. rate 20, height 5' 7 (1.702 m), weight 90.7 kg, SpO2 99%.   Exam:  Awake Alert, No new F.N deficits, Normal affect [place].AT,PERRAL Supple Neck, No JVD,   Symmetrical Chest wall movement,  Good air movement bilaterally, CTAB RRR,No Gallops, Rubs or new Murmurs,  +ve B.Sounds, Abd Soft, No tenderness,   No Cyanosis, Clubbing or edema    Assessment/Plan: Sepsis secondary to PNA (POA) Fever curve definitely better-1 episode of fever yesterday-afebrile this morning Now just on cefepime ID following and managing antibiotic regimen. All culture data negative so far, dysuria resolved, overall feeling better likely discharge in the next 1 to 2 days.  Back pain Mostly in lower back-but slightly better today. MRI T-spine/L-spine negative discitis/osteomyelitis. UDS positive for cocaine but patient vehemently denies-repeat UDS negative  Staph hominis bacteremia Likely a contaminant Patient does have hardware in right ankle-but exam is benign without any signs of infection. Appreciate ID input-no longer on vancomycin.   HIV Continue ART Viral load undetectable CD4 suppressed-due to acute illness.  History of PML with right wrist contracture Follows with outpatient hand surgery/orthopedic surgery  Mildly enlarged hilar/mediastinal lymph nodes Likely reactive to PNA Incidental finding on CT imaging Will need to repeat CT chest in 4 to 6 weeks to document resolution of lymphadenopathy.  Class 1 Obesity: Estimated body mass index is 31.32 kg/m as calculated from the following:   Height as of this encounter: 5' 7 (1.702 m).   Weight as of this encounter: 90.7 kg.   Code status:   Code Status: Full Code   DVT Prophylaxis: enoxaparin (LOVENOX) injection 40 mg Start: 01/23/24 1800   Family Communication: None at bedside   Disposition Plan: Status is: Inpatient Remains inpatient appropriate because: Severity of illness   Planned Discharge Destination:Home   Diet: Diet Order             Diet regular Room service appropriate? Yes; Fluid consistency: Thin  Diet effective now  MEDICATIONS: Scheduled Meds:   bictegravir-emtricitabine -tenofovir  AF  1 tablet Oral Daily   enoxaparin (LOVENOX) injection  40 mg Subcutaneous Q24H   insulin aspart  0-15 Units Subcutaneous TID AC & HS   polyethylene glycol  17 g Oral Daily   Continuous Infusions:  ceFEPime (MAXIPIME) IV 2 g (01/29/24 0016)   PRN Meds:.acetaminophen , HYDROmorphone  (DILAUDID ) injection, hydrOXYzine , ibuprofen, ipratropium-albuterol , LORazepam, ondansetron  (ZOFRAN ) IV, oxyCODONE , senna-docusate   I have personally reviewed following labs and imaging studies  LABORATORY DATA: CBC: Recent Labs  Lab 01/23/24 0653 01/24/24 0801 01/25/24 0337 01/26/24 0214 01/28/24 0448 01/29/24 0350  WBC 13.6* 10.4 9.2 6.3 5.9 6.7  NEUTROABS 11.9*  --   --   --   --  3.8  HGB 13.8 12.8 11.6* 10.5* 10.3* 10.2*  HCT 38.1 36.4 32.9* 29.4* 28.2* 28.2*  MCV 79.7* 79.8* 79.9* 79.0* 77.5* 78.6*  PLT 210 223 199 223 348 418*    Basic Metabolic Panel: Recent Labs  Lab 01/23/24 2308 01/24/24 0801 01/25/24 0337 01/26/24 0214 01/28/24 0448 01/29/24 0350  NA  --  136 135 139 140 140  K  --  3.9 3.9 3.5 3.6 3.7  CL  --  109 109 110 110 111  CO2  --  17* 16* 19* 21* 21*  GLUCOSE  --  184* 98 99 103* 107*  BUN  --  6 8 5* 6 8  CREATININE  --  0.77 0.88 0.71 0.66 0.68  CALCIUM   --  8.3* 8.1* 8.2* 8.2* 8.4*  MG 2.0  --   --   --   --  2.0    GFR: Estimated Creatinine Clearance: 94 mL/min (by C-G formula based on SCr of 0.68 mg/dL).  Liver Function Tests: Recent Labs  Lab 01/23/24 0653 01/24/24 0801 01/26/24 0214  AST 19 21 44*  ALT 19 19 36  ALKPHOS 76 63 51  BILITOT 1.5* 1.1 1.2  PROT 6.9 6.2* 5.8*  ALBUMIN 2.8* 2.4* 2.0*   No results for input(s): LIPASE, AMYLASE in the last 168 hours. No results for input(s): AMMONIA in the last 168 hours.  Coagulation Profile: Recent Labs  Lab 01/23/24 0653  INR 1.2    Cardiac Enzymes: No results for input(s): CKTOTAL, CKMB, CKMBINDEX, TROPONINI in the last 168 hours.  BNP  (last 3 results) No results for input(s): PROBNP in the last 8760 hours.  Lipid Profile: No results for input(s): CHOL, HDL, LDLCALC, TRIG, CHOLHDL, LDLDIRECT in the last 72 hours.  Thyroid Function Tests: No results for input(s): TSH, T4TOTAL, FREET4, T3FREE, THYROIDAB in the last 72 hours.  Anemia Panel: No results for input(s): VITAMINB12, FOLATE, FERRITIN, TIBC, IRON, RETICCTPCT in the last 72 hours.  Urine analysis:    Component Value Date/Time   COLORURINE AMBER (A) 01/23/2024 0652   APPEARANCEUR HAZY (A) 01/23/2024 0652   LABSPEC 1.022 01/23/2024 0652   PHURINE 5.0 01/23/2024 0652   GLUCOSEU >=500 (A) 01/23/2024 0652   HGBUR MODERATE (A) 01/23/2024 0652   BILIRUBINUR NEGATIVE 01/23/2024 0652   KETONESUR 5 (A) 01/23/2024 0652   PROTEINUR 100 (A) 01/23/2024 0652   NITRITE NEGATIVE 01/23/2024 0652   LEUKOCYTESUR NEGATIVE 01/23/2024 0652    Sepsis Labs: Lactic Acid, Venous    Component Value Date/Time   LATICACIDVEN 1.7 01/23/2024 0658    MICROBIOLOGY: Recent Results (from the past 240 hours)  Blood Culture (routine x 2)     Status: None   Collection Time: 01/23/24  6:25 AM   Specimen: BLOOD LEFT WRIST  Result Value Ref Range Status   Specimen Description BLOOD LEFT WRIST  Final   Special Requests   Final    BOTTLES DRAWN AEROBIC ONLY Blood Culture results may not be optimal due to an inadequate volume of blood received in culture bottles   Culture   Final    NO GROWTH 5 DAYS Performed at [facility], [address] [place], [place] [address]    Report Status 01/28/2024 FINAL  Final  Resp panel by RT-PCR (RSV, Flu A&B, Covid)     Status: None   Collection Time: 01/23/24  6:53 AM   Specimen: Nasal Swab  Result Value Ref Range Status   SARS Coronavirus 2 by RT PCR NEGATIVE NEGATIVE Final   Influenza A by PCR NEGATIVE NEGATIVE Final   Influenza B by PCR NEGATIVE NEGATIVE Final    Comment: (NOTE) The Xpert Xpress  SARS-CoV-2/FLU/RSV plus assay is intended as an aid in the diagnosis of influenza from Nasopharyngeal swab specimens and should not be used as a sole basis for treatment. Nasal washings and aspirates are unacceptable for Xpert Xpress SARS-CoV-2/FLU/RSV testing.  Fact Sheet for Patients: bloggercourse.com  Fact Sheet for Healthcare Providers: seriousbroker.it  This test is not yet approved or cleared by the [place]  FDA and has been authorized for detection and/or diagnosis of SARS-CoV-2 by FDA under an Emergency Use Authorization (EUA). This EUA will remain in effect (meaning this test can be used) for the duration of the COVID-19 declaration under Section 564(b)(1) of the Act, 21 U.S.C. section 360bbb-3(b)(1), unless the authorization is terminated or revoked.     Resp Syncytial Virus by PCR NEGATIVE NEGATIVE Final    Comment: (NOTE) Fact Sheet for Patients: bloggercourse.com  Fact Sheet for Healthcare Providers: seriousbroker.it  This test is not yet approved or cleared by the [place]  FDA and has been authorized for detection and/or diagnosis of SARS-CoV-2 by FDA under an Emergency Use Authorization (EUA). This EUA will remain in effect (meaning this test can be used) for the duration of the COVID-19 declaration under Section 564(b)(1) of the Act, 21 U.S.C. section 360bbb-3(b)(1), unless the authorization is terminated or revoked.  Performed at [facility], [address], [place], [place] [address]   Blood Culture (routine x 2)     Status: Abnormal   Collection Time: 01/23/24  6:53 AM   Specimen: BLOOD  Result Value Ref Range Status   Specimen Description BLOOD LEFT ANTECUBITAL  Final   Special Requests   Final    BOTTLES DRAWN AEROBIC AND ANAEROBIC Blood Culture results may not be optimal due to an inadequate volume of blood received in culture bottles    Culture  Setup Time   Final    GRAM POSITIVE COCCI IN CLUSTERS AEROBIC BOTTLE ONLY CRITICAL RESULT CALLED TO, READ BACK BY AND VERIFIED WITH: PHARMD G ABBOTT 01/24/2024 @ 0343 BY AB    Culture (A)  Final    STAPHYLOCOCCUS HOMINIS THE SIGNIFICANCE OF ISOLATING THIS ORGANISM FROM A SINGLE SET OF BLOOD CULTURES WHEN MULTIPLE SETS ARE DRAWN IS UNCERTAIN. PLEASE NOTIFY THE MICROBIOLOGY DEPARTMENT WITHIN ONE WEEK IF SPECIATION AND SENSITIVITIES ARE REQUIRED. Performed at [facility], [address] [place] [address] [place], [place] [address]    Report Status 01/26/2024 FINAL  Final  Respiratory (~20 pathogens) panel by PCR     Status: None   Collection Time: 01/23/24  6:53 AM   Specimen: Nasopharyngeal Swab; Respiratory  Result Value Ref Range Status   Adenovirus  NOT DETECTED NOT DETECTED Final   Coronavirus 229E NOT DETECTED NOT DETECTED Final    Comment: (NOTE) The Coronavirus on the Respiratory Panel, DOES NOT test for the novel  Coronavirus (2019 nCoV)    Coronavirus HKU1 NOT DETECTED NOT DETECTED Final   Coronavirus NL63 NOT DETECTED NOT DETECTED Final   Coronavirus OC43 NOT DETECTED NOT DETECTED Final   Metapneumovirus NOT DETECTED NOT DETECTED Final   Rhinovirus / Enterovirus NOT DETECTED NOT DETECTED Final   Influenza A NOT DETECTED NOT DETECTED Final   Influenza B NOT DETECTED NOT DETECTED Final   Parainfluenza Virus 1 NOT DETECTED NOT DETECTED Final   Parainfluenza Virus 2 NOT DETECTED NOT DETECTED Final   Parainfluenza Virus 3 NOT DETECTED NOT DETECTED Final   Parainfluenza Virus 4 NOT DETECTED NOT DETECTED Final   Respiratory Syncytial Virus NOT DETECTED NOT DETECTED Final   Bordetella pertussis NOT DETECTED NOT DETECTED Final   Bordetella Parapertussis NOT DETECTED NOT DETECTED Final   Chlamydophila pneumoniae NOT DETECTED NOT DETECTED Final   Mycoplasma pneumoniae NOT DETECTED NOT DETECTED Final    Comment: Performed at [facility], [address], [place], [place]  [address]  Blood Culture ID Panel (Reflexed)     Status: Abnormal   Collection Time: 01/23/24  6:53 AM  Result Value Ref Range Status   Enterococcus faecalis NOT DETECTED NOT DETECTED Final   Enterococcus Faecium NOT DETECTED NOT DETECTED Final   Listeria monocytogenes NOT DETECTED NOT DETECTED Final   Staphylococcus species DETECTED (A) NOT DETECTED Final    Comment: CRITICAL RESULT CALLED TO, READ BACK BY AND VERIFIED WITH: PHARMD G ABBOTT 01/24/2024 @ 0343 BY AB    Staphylococcus aureus (BCID) NOT DETECTED NOT DETECTED Final   Staphylococcus epidermidis NOT DETECTED NOT DETECTED Final   Staphylococcus lugdunensis NOT DETECTED NOT DETECTED Final   Streptococcus species NOT DETECTED NOT DETECTED Final   Streptococcus agalactiae NOT DETECTED NOT DETECTED Final   Streptococcus pneumoniae NOT DETECTED NOT DETECTED Final   Streptococcus pyogenes NOT DETECTED NOT DETECTED Final   A.calcoaceticus-baumannii NOT DETECTED NOT DETECTED Final   Bacteroides fragilis NOT DETECTED NOT DETECTED Final   Enterobacterales NOT DETECTED NOT DETECTED Final   Enterobacter cloacae complex NOT DETECTED NOT DETECTED Final   Escherichia coli NOT DETECTED NOT DETECTED Final   Klebsiella aerogenes NOT DETECTED NOT DETECTED Final   Klebsiella oxytoca NOT DETECTED NOT DETECTED Final   Klebsiella pneumoniae NOT DETECTED NOT DETECTED Final   Proteus species NOT DETECTED NOT DETECTED Final   Salmonella species NOT DETECTED NOT DETECTED Final   Serratia marcescens NOT DETECTED NOT DETECTED Final   Haemophilus influenzae NOT DETECTED NOT DETECTED Final   Neisseria meningitidis NOT DETECTED NOT DETECTED Final   Pseudomonas aeruginosa NOT DETECTED NOT DETECTED Final   Stenotrophomonas maltophilia NOT DETECTED NOT DETECTED Final   Candida albicans NOT DETECTED NOT DETECTED Final   Candida auris NOT DETECTED NOT DETECTED Final   Candida glabrata NOT DETECTED NOT DETECTED Final   Candida krusei NOT DETECTED NOT DETECTED  Final   Candida parapsilosis NOT DETECTED NOT DETECTED Final   Candida tropicalis NOT DETECTED NOT DETECTED Final   Cryptococcus neoformans/gattii NOT DETECTED NOT DETECTED Final    Comment: Performed at [facility], [address], [place], [place] [address]  Culture, blood (Routine X 2) w Reflex to ID Panel     Status: None (Preliminary result)   Collection Time: 01/24/24  8:01 AM   Specimen: BLOOD LEFT HAND  Result Value Ref Range Status   Specimen Description BLOOD LEFT HAND  Final   Special Requests   Final    BOTTLES DRAWN AEROBIC AND ANAEROBIC Blood Culture adequate volume   Culture   Final    NO GROWTH 4 DAYS Performed at [facility], [address], [place], [place] [address]    Report Status PENDING  Incomplete  Culture, blood (Routine X 2) w Reflex to ID Panel     Status: None (Preliminary result)   Collection Time: 01/24/24  8:01 AM   Specimen: BLOOD LEFT ARM  Result Value Ref Range Status   Specimen Description BLOOD LEFT ARM  Final   Special Requests   Final    BOTTLES DRAWN AEROBIC AND ANAEROBIC Blood Culture adequate volume   Culture   Final    NO GROWTH 4 DAYS Performed at [facility], [address], [place], [place] [address]    Report Status PENDING  Incomplete  Respiratory (~20 pathogens) panel by PCR     Status: None   Collection Time: 01/24/24  9:51 AM   Specimen: Nasopharyngeal Swab; Respiratory  Result Value Ref Range Status   Adenovirus NOT DETECTED NOT DETECTED Final   Coronavirus 229E NOT DETECTED NOT DETECTED Final    Comment: (NOTE) The Coronavirus on the Respiratory Panel, DOES NOT test for the novel  Coronavirus (2019 nCoV)    Coronavirus HKU1 NOT DETECTED NOT DETECTED Final   Coronavirus NL63 NOT DETECTED NOT DETECTED Final   Coronavirus OC43 NOT DETECTED NOT DETECTED Final   Metapneumovirus NOT DETECTED NOT DETECTED Final   Rhinovirus / Enterovirus NOT DETECTED NOT DETECTED Final   Influenza A NOT DETECTED NOT DETECTED  Final   Influenza B NOT DETECTED NOT DETECTED Final   Parainfluenza Virus 1 NOT DETECTED NOT DETECTED Final   Parainfluenza Virus 2 NOT DETECTED NOT DETECTED Final   Parainfluenza Virus 3 NOT DETECTED NOT DETECTED Final   Parainfluenza Virus 4 NOT DETECTED NOT DETECTED Final   Respiratory Syncytial Virus NOT DETECTED NOT DETECTED Final   Bordetella pertussis NOT DETECTED NOT DETECTED Final   Bordetella Parapertussis NOT DETECTED NOT DETECTED Final   Chlamydophila pneumoniae NOT DETECTED NOT DETECTED Final   Mycoplasma pneumoniae NOT DETECTED NOT DETECTED Final    Comment: Performed at [facility], [address], [place], [place] [address]  Expectorated Sputum Assessment w Gram Stain, Rflx to Resp Cult     Status: None   Collection Time: 01/25/24  7:06 AM   Specimen: Sputum  Result Value Ref Range Status   Specimen Description SPUTUM  Final   Special Requests NONE  Final   Sputum evaluation   Final    Sputum specimen not acceptable for testing.  Please recollect.   Gram Stain Report Called to,Read Back By and Verified With: RN DOROTHA FOY [phone] @ [phone] Performed at [facility], [address], [place], [place] [address]    Report Status 01/25/2024 FINAL  Final  Culture, blood (Routine X 2) w Reflex to ID Panel     Status: None (Preliminary result)   Collection Time: 01/25/24  9:09 AM   Specimen: BLOOD RIGHT HAND  Result Value Ref Range Status   Specimen Description BLOOD RIGHT HAND  Final   Special Requests   Final    BOTTLES DRAWN AEROBIC AND ANAEROBIC Blood Culture results may not be optimal due to an inadequate volume of blood received in culture bottles   Culture  Final    NO GROWTH 3 DAYS Performed at [facility], [address], [place], [place] [address]    Report Status PENDING  Incomplete  Culture, blood (Routine X 2) w Reflex to ID Panel     Status: None (Preliminary result)   Collection Time: 01/25/24  9:10 AM   Specimen: BLOOD RIGHT ARM   Result Value Ref Range Status   Specimen Description BLOOD RIGHT ARM  Final   Special Requests   Final    BOTTLES DRAWN AEROBIC AND ANAEROBIC Blood Culture results may not be optimal due to an inadequate volume of blood received in culture bottles   Culture   Final    NO GROWTH 3 DAYS Performed at [facility], [address], [place], [place] [address]    Report Status PENDING  Incomplete  MRSA Next Gen by PCR, Nasal     Status: None   Collection Time: 01/25/24  9:25 AM   Specimen: Nasal Mucosa; Nasal Swab  Result Value Ref Range Status   MRSA by PCR Next Gen NOT DETECTED NOT DETECTED Final    Comment: (NOTE) The GeneXpert MRSA Assay (FDA approved for NASAL specimens only), is one component of a comprehensive MRSA colonization surveillance program. It is not intended to diagnose MRSA infection nor to guide or monitor treatment for MRSA infections. Test performance is not FDA approved in patients less than 83 years old. Performed at [facility], [address], [place], [place] [address]   Expectorated Sputum Assessment w Gram Stain, Rflx to Resp Cult     Status: None   Collection Time: 01/26/24  3:12 PM   Specimen: Expectorated Sputum  Result Value Ref Range Status   Specimen Description EXPECTORATED SPUTUM  Final   Special Requests NONE  Final   Sputum evaluation   Final    Sputum specimen not acceptable for testing.  Please recollect.   NOTIFIED RN [facility] ON 01/26/24 @ 1715 BY DRT Performed at [facility], [address] [place], [place] [address]    Report Status 01/26/2024 FINAL  Final    RADIOLOGY STUDIES/RESULTS: No results found.    LOS: 6 days   Lavada Stank, MD  [facility]    To contact the attending provider between 7A-7P or the covering provider during after hours 7P-7A, please log into the web site [email or web address] and access using universal [place] password for that web site. If you do not have the password, please call  the hospital operator.  01/29/2024, 8:33 AM

## 2024-01-29 NOTE — Progress Notes (Signed)
 [facility]  Date of Admission:  01/23/2024       Abx: 11/14-c cefepime  11/12-14 azith/ceftriaxone/vanc  ASSESSMENT: #hiv Lab Results  Component Value Date   HIV1RNAQUANT <20 01/23/2024   Lab Results  Component Value Date   CD4TCELL 26 (L) 01/23/2024   CD4TABS 220 (L) 01/23/2024   Well controlled on biktarvy  and can continue F/u with Dr Fleeta Rothman routinely in 05/2024   #CAP Resolving on appropriate abx Finish total 7 days abx after today Ok to discharge from id standpoint Will switch to po abx amox-clav to finish off course Maintain standard isolation precaution Can follow pcp for hospital discharge issue Id will sign off  Discussed with primary team  Principal Problem:   CAP (community acquired pneumonia) Active Problems:   HIV disease (HCC)   Allergies  Allergen Reactions   Morphine Other (See Comments)    Hallucinations    Methadone Nausea And Vomiting    Scheduled Meds:  bictegravir-emtricitabine -tenofovir  AF  1 tablet Oral Daily   enoxaparin (LOVENOX) injection  40 mg Subcutaneous Q24H   insulin aspart  0-15 Units Subcutaneous TID AC & HS   polyethylene glycol  17 g Oral Daily   Continuous Infusions:  ceFEPime (MAXIPIME) IV 2 g (01/29/24 1122)   PRN Meds:.acetaminophen , HYDROmorphone  (DILAUDID ) injection, hydrOXYzine , ibuprofen, ipratropium-albuterol , LORazepam, ondansetron  (ZOFRAN ) IV, oxyCODONE , senna-docusate   SUBJECTIVE: Felt significantly better No o2 supplement Afebrile for last 48 hours No leukocytosis  Asking if she can go home    Review of Systems: ROS All other ROS was negative, except mentioned above     OBJECTIVE: Vitals:   01/28/24 2345 01/29/24 0000 01/29/24 0015 01/29/24 0307  BP:   (!) 150/83 (!) 140/61  Pulse:    60  Resp: (!) 21 (!) 22 17 20   Temp:   98.4 F (36.9 C) 98.4 F (36.9 C)  TempSrc:   Oral Oral  SpO2:      Weight:      Height:       Body mass index is  31.32 kg/m.  Physical Exam  General/constitutional: no distress, pleasant HEENT: Normocephalic, PER, Conj Clear, EOMI, Oropharynx clear Neck supple CV: rrr no mrg Lungs: clear to auscultation, normal respiratory effort Abd: Soft, Nontender Ext: no edema Skin: No Rash Neuro: nonfocal MSK: no peripheral joint swelling/tenderness/warmth; back spines nontender   Lab Results Lab Results  Component Value Date   WBC 6.7 01/29/2024   HGB 10.2 (L) 01/29/2024   HCT 28.2 (L) 01/29/2024   MCV 78.6 (L) 01/29/2024   PLT 418 (H) 01/29/2024    Lab Results  Component Value Date   CREATININE 0.68 01/29/2024   BUN 8 01/29/2024   NA 140 01/29/2024   K 3.7 01/29/2024   CL 111 01/29/2024   CO2 21 (L) 01/29/2024    Lab Results  Component Value Date   ALT 36 01/26/2024   AST 44 (H) 01/26/2024   ALKPHOS 51 01/26/2024   BILITOT 1.2 01/26/2024      Microbiology: Recent Results (from the past 240 hours)  Blood Culture (routine x 2)     Status: None   Collection Time: 01/23/24  6:25 AM   Specimen: BLOOD LEFT WRIST  Result Value Ref Range Status   Specimen Description BLOOD LEFT WRIST  Final   Special Requests   Final    BOTTLES DRAWN AEROBIC ONLY Blood Culture results may not be optimal due to an inadequate volume  of blood received in culture bottles   Culture   Final    NO GROWTH 5 DAYS Performed at [facility], [address] [place], [place] [address]    Report Status 01/28/2024 FINAL  Final  Resp panel by RT-PCR (RSV, Flu A&B, Covid)     Status: None   Collection Time: 01/23/24  6:53 AM   Specimen: Nasal Swab  Result Value Ref Range Status   SARS Coronavirus 2 by RT PCR NEGATIVE NEGATIVE Final   Influenza A by PCR NEGATIVE NEGATIVE Final   Influenza B by PCR NEGATIVE NEGATIVE Final    Comment: (NOTE) The Xpert Xpress SARS-CoV-2/FLU/RSV plus assay is intended as an aid in the diagnosis of influenza from Nasopharyngeal swab specimens and should not be used as a sole  basis for treatment. Nasal washings and aspirates are unacceptable for Xpert Xpress SARS-CoV-2/FLU/RSV testing.  Fact Sheet for Patients: bloggercourse.com  Fact Sheet for Healthcare Providers: seriousbroker.it  This test is not yet approved or cleared by the [place]  FDA and has been authorized for detection and/or diagnosis of SARS-CoV-2 by FDA under an Emergency Use Authorization (EUA). This EUA will remain in effect (meaning this test can be used) for the duration of the COVID-19 declaration under Section 564(b)(1) of the Act, 21 U.S.C. section 360bbb-3(b)(1), unless the authorization is terminated or revoked.     Resp Syncytial Virus by PCR NEGATIVE NEGATIVE Final    Comment: (NOTE) Fact Sheet for Patients: bloggercourse.com  Fact Sheet for Healthcare Providers: seriousbroker.it  This test is not yet approved or cleared by the [place]  FDA and has been authorized for detection and/or diagnosis of SARS-CoV-2 by FDA under an Emergency Use Authorization (EUA). This EUA will remain in effect (meaning this test can be used) for the duration of the COVID-19 declaration under Section 564(b)(1) of the Act, 21 U.S.C. section 360bbb-3(b)(1), unless the authorization is terminated or revoked.  Performed at [facility], [address] [place], [place] [address]   Blood Culture (routine x 2)     Status: Abnormal   Collection Time: 01/23/24  6:53 AM   Specimen: BLOOD  Result Value Ref Range Status   Specimen Description BLOOD LEFT ANTECUBITAL  Final   Special Requests   Final    BOTTLES DRAWN AEROBIC AND ANAEROBIC Blood Culture results may not be optimal due to an inadequate volume of blood received in culture bottles   Culture  Setup Time   Final    GRAM POSITIVE COCCI IN CLUSTERS AEROBIC BOTTLE ONLY CRITICAL RESULT CALLED TO, READ BACK BY AND VERIFIED  WITH: PHARMD G ABBOTT 01/24/2024 @ 0343 BY AB    Culture (A)  Final    STAPHYLOCOCCUS HOMINIS THE SIGNIFICANCE OF ISOLATING THIS ORGANISM FROM A SINGLE SET OF BLOOD CULTURES WHEN MULTIPLE SETS ARE DRAWN IS UNCERTAIN. PLEASE NOTIFY THE MICROBIOLOGY DEPARTMENT WITHIN ONE WEEK IF SPECIATION AND SENSITIVITIES ARE REQUIRED. Performed at [facility], [address] [place], [place] [address]    Report Status 01/26/2024 FINAL  Final  Respiratory (~20 pathogens) panel by PCR     Status: None   Collection Time: 01/23/24  6:53 AM   Specimen: Nasopharyngeal Swab; Respiratory  Result Value Ref Range Status   Adenovirus NOT DETECTED NOT DETECTED Final   Coronavirus 229E NOT DETECTED NOT DETECTED Final    Comment: (NOTE) The Coronavirus on the Respiratory Panel, DOES NOT test for the novel  Coronavirus (2019 nCoV)    Coronavirus HKU1  NOT DETECTED NOT DETECTED Final   Coronavirus NL63 NOT DETECTED NOT DETECTED Final   Coronavirus OC43 NOT DETECTED NOT DETECTED Final   Metapneumovirus NOT DETECTED NOT DETECTED Final   Rhinovirus / Enterovirus NOT DETECTED NOT DETECTED Final   Influenza A NOT DETECTED NOT DETECTED Final   Influenza B NOT DETECTED NOT DETECTED Final   Parainfluenza Virus 1 NOT DETECTED NOT DETECTED Final   Parainfluenza Virus 2 NOT DETECTED NOT DETECTED Final   Parainfluenza Virus 3 NOT DETECTED NOT DETECTED Final   Parainfluenza Virus 4 NOT DETECTED NOT DETECTED Final   Respiratory Syncytial Virus NOT DETECTED NOT DETECTED Final   Bordetella pertussis NOT DETECTED NOT DETECTED Final   Bordetella Parapertussis NOT DETECTED NOT DETECTED Final   Chlamydophila pneumoniae NOT DETECTED NOT DETECTED Final   Mycoplasma pneumoniae NOT DETECTED NOT DETECTED Final    Comment: Performed at [facility], [address] [place], [place] [address]  Blood Culture ID Panel (Reflexed)     Status: Abnormal   Collection Time: 01/23/24  6:53 AM  Result Value Ref Range Status    Enterococcus faecalis NOT DETECTED NOT DETECTED Final   Enterococcus Faecium NOT DETECTED NOT DETECTED Final   Listeria monocytogenes NOT DETECTED NOT DETECTED Final   Staphylococcus species DETECTED (A) NOT DETECTED Final    Comment: CRITICAL RESULT CALLED TO, READ BACK BY AND VERIFIED WITH: PHARMD G ABBOTT 01/24/2024 @ 0343 BY AB    Staphylococcus aureus (BCID) NOT DETECTED NOT DETECTED Final   Staphylococcus epidermidis NOT DETECTED NOT DETECTED Final   Staphylococcus lugdunensis NOT DETECTED NOT DETECTED Final   Streptococcus species NOT DETECTED NOT DETECTED Final   Streptococcus agalactiae NOT DETECTED NOT DETECTED Final   Streptococcus pneumoniae NOT DETECTED NOT DETECTED Final   Streptococcus pyogenes NOT DETECTED NOT DETECTED Final   A.calcoaceticus-baumannii NOT DETECTED NOT DETECTED Final   Bacteroides fragilis NOT DETECTED NOT DETECTED Final   Enterobacterales NOT DETECTED NOT DETECTED Final   Enterobacter cloacae complex NOT DETECTED NOT DETECTED Final   Escherichia coli NOT DETECTED NOT DETECTED Final   Klebsiella aerogenes NOT DETECTED NOT DETECTED Final   Klebsiella oxytoca NOT DETECTED NOT DETECTED Final   Klebsiella pneumoniae NOT DETECTED NOT DETECTED Final   Proteus species NOT DETECTED NOT DETECTED Final   Salmonella species NOT DETECTED NOT DETECTED Final   Serratia marcescens NOT DETECTED NOT DETECTED Final   Haemophilus influenzae NOT DETECTED NOT DETECTED Final   Neisseria meningitidis NOT DETECTED NOT DETECTED Final   Pseudomonas aeruginosa NOT DETECTED NOT DETECTED Final   Stenotrophomonas maltophilia NOT DETECTED NOT DETECTED Final   Candida albicans NOT DETECTED NOT DETECTED Final   Candida auris NOT DETECTED NOT DETECTED Final   Candida glabrata NOT DETECTED NOT DETECTED Final   Candida krusei NOT DETECTED NOT DETECTED Final   Candida parapsilosis NOT DETECTED NOT DETECTED Final   Candida tropicalis NOT DETECTED NOT DETECTED Final   Cryptococcus  neoformans/gattii NOT DETECTED NOT DETECTED Final    Comment: Performed at [facility], [address], [place], [place] [address]  Culture, blood (Routine X 2) w Reflex to ID Panel     Status: None   Collection Time: 01/24/24  8:01 AM   Specimen: BLOOD LEFT HAND  Result Value Ref Range Status   Specimen Description BLOOD LEFT HAND  Final   Special Requests   Final    BOTTLES DRAWN AEROBIC AND ANAEROBIC Blood Culture adequate volume   Culture   Final  NO GROWTH 5 DAYS Performed at [facility], [address], [place], [place] [address]    Report Status 01/29/2024 FINAL  Final  Culture, blood (Routine X 2) w Reflex to ID Panel     Status: None   Collection Time: 01/24/24  8:01 AM   Specimen: BLOOD LEFT ARM  Result Value Ref Range Status   Specimen Description BLOOD LEFT ARM  Final   Special Requests   Final    BOTTLES DRAWN AEROBIC AND ANAEROBIC Blood Culture adequate volume   Culture   Final    NO GROWTH 5 DAYS Performed at [facility], [address] [place], [place] [address]    Report Status 01/29/2024 FINAL  Final  Respiratory (~20 pathogens) panel by PCR     Status: None   Collection Time: 01/24/24  9:51 AM   Specimen: Nasopharyngeal Swab; Respiratory  Result Value Ref Range Status   Adenovirus NOT DETECTED NOT DETECTED Final   Coronavirus 229E NOT DETECTED NOT DETECTED Final    Comment: (NOTE) The Coronavirus on the Respiratory Panel, DOES NOT test for the novel  Coronavirus (2019 nCoV)    Coronavirus HKU1 NOT DETECTED NOT DETECTED Final   Coronavirus NL63 NOT DETECTED NOT DETECTED Final   Coronavirus OC43 NOT DETECTED NOT DETECTED Final   Metapneumovirus NOT DETECTED NOT DETECTED Final   Rhinovirus / Enterovirus NOT DETECTED NOT DETECTED Final   Influenza A NOT DETECTED NOT DETECTED Final   Influenza B NOT DETECTED NOT DETECTED Final   Parainfluenza Virus 1 NOT DETECTED NOT DETECTED Final   Parainfluenza Virus 2 NOT DETECTED NOT DETECTED  Final   Parainfluenza Virus 3 NOT DETECTED NOT DETECTED Final   Parainfluenza Virus 4 NOT DETECTED NOT DETECTED Final   Respiratory Syncytial Virus NOT DETECTED NOT DETECTED Final   Bordetella pertussis NOT DETECTED NOT DETECTED Final   Bordetella Parapertussis NOT DETECTED NOT DETECTED Final   Chlamydophila pneumoniae NOT DETECTED NOT DETECTED Final   Mycoplasma pneumoniae NOT DETECTED NOT DETECTED Final    Comment: Performed at [facility], [address] [place], [place] [address]  Expectorated Sputum Assessment w Gram Stain, Rflx to Resp Cult     Status: None   Collection Time: 01/25/24  7:06 AM   Specimen: Sputum  Result Value Ref Range Status   Specimen Description SPUTUM  Final   Special Requests NONE  Final   Sputum evaluation   Final    Sputum specimen not acceptable for testing.  Please recollect.   Gram Stain Report Called to,Read Back By and Verified With: RN DOROTHA FOY [phone] @ [phone] FH Performed at [facility], [address], [place], [place] [address]    Report Status 01/25/2024 FINAL  Final  Culture, blood (Routine X 2) w Reflex to ID Panel     Status: None (Preliminary result)   Collection Time: 01/25/24  9:09 AM   Specimen: BLOOD RIGHT HAND  Result Value Ref Range Status   Specimen Description BLOOD RIGHT HAND  Final   Special Requests   Final    BOTTLES DRAWN AEROBIC AND ANAEROBIC Blood Culture results may not be optimal due to an inadequate volume of blood received in culture bottles   Culture   Final    NO GROWTH 4 DAYS Performed at [facility], [address] [place], [place] [address]    Report Status PENDING  Incomplete  Culture, blood (Routine X 2) w Reflex to ID Panel  Status: None (Preliminary result)   Collection Time: 01/25/24  9:10 AM   Specimen: BLOOD RIGHT ARM  Result Value Ref Range Status   Specimen Description BLOOD RIGHT ARM  Final   Special Requests   Final    BOTTLES DRAWN AEROBIC AND ANAEROBIC Blood Culture results  may not be optimal due to an inadequate volume of blood received in culture bottles   Culture   Final    NO GROWTH 4 DAYS Performed at [facility], [address], [place], [place] [address]    Report Status PENDING  Incomplete  MRSA Next Gen by PCR, Nasal     Status: None   Collection Time: 01/25/24  9:25 AM   Specimen: Nasal Mucosa; Nasal Swab  Result Value Ref Range Status   MRSA by PCR Next Gen NOT DETECTED NOT DETECTED Final    Comment: (NOTE) The GeneXpert MRSA Assay (FDA approved for NASAL specimens only), is one component of a comprehensive MRSA colonization surveillance program. It is not intended to diagnose MRSA infection nor to guide or monitor treatment for MRSA infections. Test performance is not FDA approved in patients less than 71 years old. Performed at [facility], [address], [place], [place] [address]   Expectorated Sputum Assessment w Gram Stain, Rflx to Resp Cult     Status: None   Collection Time: 01/26/24  3:12 PM   Specimen: Expectorated Sputum  Result Value Ref Range Status   Specimen Description EXPECTORATED SPUTUM  Final   Special Requests NONE  Final   Sputum evaluation   Final    Sputum specimen not acceptable for testing.  Please recollect.   NOTIFIED RN [facility] ON 01/26/24 @ 1715 BY DRT Performed at [facility], [address], [place], [place] [address]    Report Status 01/26/2024 FINAL  Final     Serology:   Imaging: If present, new imagings (plain films, ct scans, and mri) have been personally visualized and interpreted; radiology reports have been reviewed. Decision making incorporated into the Impression / Recommendations.  11/13 chest ct 1. Right lower lobe pneumonia. Clinical correlation and follow-up to resolution recommended. 2. Mildly enlarged right hilar and mediastinal lymph nodes, likely reactive. 3. Cholelithiasis. 4.  Emphysema     Constance ONEIDA Passer, MD [facility] [phone] pager    01/29/2024, 3:56 PM

## 2024-01-30 ENCOUNTER — Other Ambulatory Visit (HOSPITAL_COMMUNITY): Payer: Self-pay

## 2024-01-30 DIAGNOSIS — J189 Pneumonia, unspecified organism: Secondary | ICD-10-CM | POA: Diagnosis not present

## 2024-01-30 LAB — CBC WITH DIFFERENTIAL/PLATELET
Abs Immature Granulocytes: 0.89 K/uL — ABNORMAL HIGH (ref 0.00–0.07)
Basophils Absolute: 0.1 K/uL (ref 0.0–0.1)
Basophils Relative: 1 %
Eosinophils Absolute: 0.2 K/uL (ref 0.0–0.5)
Eosinophils Relative: 3 %
HCT: 31 % — ABNORMAL LOW (ref 36.0–46.0)
Hemoglobin: 11.1 g/dL — ABNORMAL LOW (ref 12.0–15.0)
Immature Granulocytes: 11 %
Lymphocytes Relative: 29 %
Lymphs Abs: 2.3 K/uL (ref 0.7–4.0)
MCH: 28.2 pg (ref 26.0–34.0)
MCHC: 35.8 g/dL (ref 30.0–36.0)
MCV: 78.9 fL — ABNORMAL LOW (ref 80.0–100.0)
Monocytes Absolute: 0.4 K/uL (ref 0.1–1.0)
Monocytes Relative: 5 %
Neutro Abs: 4 K/uL (ref 1.7–7.7)
Neutrophils Relative %: 51 %
Platelets: 533 K/uL — ABNORMAL HIGH (ref 150–400)
RBC: 3.93 MIL/uL (ref 3.87–5.11)
RDW: 13.8 % (ref 11.5–15.5)
Smear Review: NORMAL
WBC: 7.9 K/uL (ref 4.0–10.5)
nRBC: 0 % (ref 0.0–0.2)

## 2024-01-30 LAB — BASIC METABOLIC PANEL WITH GFR
Anion gap: 14 (ref 5–15)
BUN: 9 mg/dL (ref 6–20)
CO2: 18 mmol/L — ABNORMAL LOW (ref 22–32)
Calcium: 8.6 mg/dL — ABNORMAL LOW (ref 8.9–10.3)
Chloride: 107 mmol/L (ref 98–111)
Creatinine, Ser: 0.63 mg/dL (ref 0.44–1.00)
GFR, Estimated: >60 mL/min (ref >60)
Glucose, Bld: 98 mg/dL (ref 70–99)
Potassium: 3.8 mmol/L (ref 3.5–5.1)
Sodium: 139 mmol/L (ref 135–145)

## 2024-01-30 LAB — CULTURE, BLOOD (ROUTINE X 2)
Culture: NO GROWTH
Culture: NO GROWTH

## 2024-01-30 LAB — GLUCOSE, CAPILLARY: Glucose-Capillary: 113 mg/dL — ABNORMAL HIGH (ref 70–99)

## 2024-01-30 LAB — MAGNESIUM: Magnesium: 2.1 mg/dL (ref 1.7–2.4)

## 2024-01-30 MED ORDER — AMOXICILLIN-POT CLAVULANATE 875-125 MG PO TABS
1.0000 | ORAL_TABLET | Freq: Two times a day (BID) | ORAL | Status: DC
  Administered 2024-01-30: 1 via ORAL
  Filled 2024-01-30: qty 1

## 2024-01-30 MED ORDER — AMOXICILLIN-POT CLAVULANATE 875-125 MG PO TABS
1.0000 | ORAL_TABLET | Freq: Two times a day (BID) | ORAL | 0 refills | Status: AC
  Filled 2024-01-30: qty 2, 1d supply, fill #0

## 2024-01-30 MED ORDER — ALBUTEROL SULFATE HFA 108 (90 BASE) MCG/ACT IN AERS
1.0000 | INHALATION_SPRAY | RESPIRATORY_TRACT | 0 refills | Status: AC | PRN
  Filled 2024-01-30: qty 18, 17d supply, fill #0

## 2024-01-30 MED ORDER — ACETAMINOPHEN 325 MG PO TABS
650.0000 mg | ORAL_TABLET | Freq: Four times a day (QID) | ORAL | 0 refills | Status: AC | PRN
  Filled 2024-01-30: qty 20, 3d supply, fill #0

## 2024-01-30 MED ORDER — TRAMADOL HCL 50 MG PO TABS
50.0000 mg | ORAL_TABLET | Freq: Two times a day (BID) | ORAL | 0 refills | Status: AC | PRN
  Filled 2024-01-30: qty 10, 5d supply, fill #0

## 2024-01-30 NOTE — Discharge Summary (Signed)
 Discharge summary note.  Paula Martinez FMW:[id number] DOB: 06-09-1970 DOA: 01/23/2024  PCP: Sun, Vyvyan, MD  Admit date: 01/23/2024  Discharge date: 01/30/2024  Admitted From: Home   Disposition:  Home   Recommendations for Outpatient Follow-up:   Follow up with PCP in 1-2 weeks  PCP Please obtain BMP/CBC, 2 view CXR in 1week,  (see Discharge instructions)   PCP Please follow up on the following pending results: Needs repeat CT scan of the chest in 4 to 6 weeks, to be arranged per PCP and ID.   Home Health: PT   Equipment/Devices: None  Consultations: ID Discharge Condition: Stable    CODE STATUS: Full    Diet Recommendation: Heart Healthy     Chief Complaint  Patient presents with   Respiratory Distress     Brief history of present illness from the day of admission and additional interim summary     53 y.o.  female with history of HIV-on ART, prior PML-s/p right hand/wrist contracture-who presented with 2-3-day history of fever-myalgias-low back pain-dry cough-found to have PNA and subsequently admitted to the hospitalist service.  Hospital course complicated by persistent fever-see below for further details.   Significant events: 11/11>> admit to [facility] 11/13>> persistently febrile-vancomycin started.  MRI T-spine/L-spine negative.  CT chest-confirms RLL PNA without empyema/cavitation/abscess.   Significant studies: 11/11>> CXR: Left> right base opacity 11/13>> MRI L-spine/T-spine: No discitis/osteomyelitis 11/13> CT chest: RLL PNA   Significant microbiology data: 11/11>> COVID/influenza/RSV PCR: Negative 11/11>> blood culture 1/2: Staph hominis 11/12>> blood culture: No growth 11/12>> respiratory virus panel: Negative 11/13>> sputum cultures: Nondiagnostic-specimen unacceptable for  testing. 11/13>> urine Legionella antigen: Negative 11/13>> urine streptococcal antigen: Negative. 11/13>> blood culture: No growth 11/14>> sputum culture: Nondiagnostic-specimen not acceptable for testing.                                                                 Hospital Course   Sepsis secondary to PNA (POA) Fever curve definitely better-1 episode of fever yesterday-afebrile this morning Was treated with empiric antibiotics, seen by ID, significantly better clinically and close to her baseline, All culture data negative so far, dysuria resolved, discussed with ID on 01/29/2024 discharged on few more days of oral Augmentin .   Back pain Pain almost completely resolved. MRI T-spine/L-spine negative discitis/osteomyelitis. UDS positive for cocaine but patient vehemently denies-repeat UDS negative.  This problem has resolved   Staph hominis bacteremia Likely a contaminant Patient does have hardware in right ankle-but exam is benign without any signs of infection. Appreciate ID input-no longer on vancomycin.    HIV Continue ART Viral load undetectable CD4 suppressed-due to acute illness.  Will follow-up with ID within 7 to 10 days of discharge.   History of PML with right wrist contracture Follows with outpatient hand surgery/orthopedic surgery, at  baseline she is quite weak in lower extremities and is wheelchair-bound.   Mildly enlarged hilar/mediastinal lymph nodes Likely reactive to PNA Incidental finding on CT imaging Will need to repeat CT chest in 4 to 6 weeks to document resolution of lymphadenopathy.   Class 1 Obesity: Estimated body mass index is 31.32, follow-up with PCP for weight loss.    Discharge diagnosis     Principal Problem:   CAP (community acquired pneumonia) Active Problems:   HIV disease [facility])    Discharge instructions    Discharge Instructions     Diet - low sodium heart healthy   Complete by: As directed    Discharge instructions    Complete by: As directed    Follow with Primary MD Sun, Vyvyan, MD in 7 days, follow-up with your infectious disease physician within 7 to 10 days of discharge.  Get CBC, CMP, Magnesium , 2 view Chest X ray -  checked next visit with your primary MD    Activity: As tolerated with Full fall precautions use walker/cane & assistance as needed  Disposition Home    Diet: Heart Healthy    Special Instructions: If you have smoked or chewed Tobacco  in the last 2 yrs please stop smoking, stop any regular Alcohol  and or any Recreational drug use.  On your next visit with your primary care physician please Get Medicines reviewed and adjusted.  Please request your Prim.MD to go over all Hospital Tests and Procedure/Radiological results at the follow up, please get all Hospital records sent to your Prim MD by signing hospital release before you go home.  If you experience worsening of your admission symptoms, develop shortness of breath, life threatening emergency, suicidal or homicidal thoughts you must seek medical attention immediately by calling 911 or calling your MD immediately  if symptoms less severe.  You Must read complete instructions/literature along with all the possible adverse reactions/side effects for all the Medicines you take and that have been prescribed to you. Take any new Medicines after you have completely understood and accpet all the possible adverse reactions/side effects.   Do not drive when taking Pain medications.  Do not take more than prescribed Pain, Sleep and Anxiety Medications  Wear Seat belts while driving.   Increase activity slowly   Complete by: As directed        Discharge Medications   Allergies as of 01/30/2024       Reactions   Morphine Other (See Comments)   Hallucinations    Methadone Nausea And Vomiting        Medication List     TAKE these medications    acetaminophen  325 MG tablet Commonly known as: TYLENOL  Take 2 tablets (650 mg  total) by mouth every 6 (six) hours as needed for moderate pain (pain score 4-6) or mild pain (pain score 1-3).   albuterol  108 (90 Base) MCG/ACT inhaler Commonly known as: VENTOLIN  HFA Inhale 1-2 puffs into the lungs every 4 (four) hours as needed for wheezing or shortness of breath.   amoxicillin -clavulanate 875-125 MG tablet Commonly known as: AUGMENTIN  Take 1 tablet by mouth every 12 (twelve) hours.   atorvastatin  10 MG tablet Commonly known as: LIPITOR Take 1 tablet (10 mg total) by mouth daily.   Biktarvy  50-200-25 MG Tabs tablet Generic drug: bictegravir-emtricitabine -tenofovir  AF Take 1 tablet by mouth daily.   gabapentin  300 MG capsule Commonly known as: Neurontin  Take 1 capsule (300 mg total) by mouth at bedtime. Can increase to TWO capsules  at night if tolerated after 2 weeks What changed:  when to take this reasons to take this additional instructions   traMADol  50 MG tablet Commonly known as: Ultram  Take 1 tablet (50 mg total) by mouth every 12 (twelve) hours as needed for severe pain (pain score 7-10).               Durable Medical Equipment  (From admission, onward)           Start     Ordered   01/26/24 1535  For home use only DME Cane  Once       Comments: 4 prong   01/26/24 1534             Follow-up Information     Care, [facility] Follow up.   Specialty: Home Health Services Why: Active with [facility] will call to resume services Contact information: [address] [place] [address] [phone]         Sun, Vyvyan, MD Follow up on 02/12/2024.   Specialty: Family Medicine Why: 12:30 pm  for hospital follow up. If you have to change or cncel this appointment please call in.[facility] information: [phone] [place] [address] [phone]         Luiz Channel, MD. Schedule an appointment as soon as possible for a visit in 1 week(s).   Specialty: Infectious  Diseases Contact information: [address] [place] [address] [phone]                 Major procedures and Radiology Reports - PLEASE review detailed and final reports thoroughly  -       CT CHEST WO CONTRAST Result Date: 01/25/2024 CLINICAL DATA:  Pneumonia complication suspected. EXAM: CT CHEST WITHOUT CONTRAST TECHNIQUE: Multidetector CT imaging of the chest was performed following the standard protocol without IV contrast. RADIATION DOSE REDUCTION: This exam was performed according to the departmental dose-optimization program which includes automated exposure control, adjustment of the mA and/or kV according to patient size and/or use of iterative reconstruction technique. COMPARISON:  Chest radiograph dated 01/25/2024. FINDINGS: Evaluation of this exam is limited in the absence of intravenous contrast. Cardiovascular: There is no cardiomegaly or pericardial effusion. The thoracic aorta and the central pulmonary arteries are grossly unremarkable on this noncontrast CT. Mediastinum/Nodes: Mildly enlarged right hilar and mediastinal lymph nodes measure up to 15 mm in the subcarinal region and 14 mm anterior to the trachea. The esophagus is grossly unremarkable. No mediastinal fluid collection. Lungs/Pleura: Large area of consolidation in the right lower lobe most consistent with pneumonia. Clinical correlation and follow-up to resolution recommended. Subsegmental density in the right middle lobe along the fissure also atelectasis or infiltrate. Faint areas of ground-glass density in the left upper lobe and to a lesser degree left lower lobe noted. No pleural effusion pneumothorax. Mild paraseptal emphysema. The central airways are patent. Upper Abdomen: Small gallstones. Musculoskeletal: No acute osseous pathology. IMPRESSION: 1. Right lower lobe pneumonia. Clinical correlation and follow-up to resolution recommended. 2. Mildly enlarged right hilar and mediastinal  lymph nodes, likely reactive. 3. Cholelithiasis. 4.  Emphysema (ICD10-J43.9). Electronically Signed   By: Vanetta Chou M.D.   On: 01/25/2024 16:54   MR Lumbar Spine W Wo Contrast Result Date: 01/25/2024 EXAM: MRI LUMBAR SPINE 01/25/2024 12:35:00 PM TECHNIQUE: Multiplanar multisequence MRI of the lumbar spine was performed with and without the administration of intravenous contrast. COMPARISON: Thoracic MRI 01/25/2024 reported  separately. CLINICAL HISTORY: 53 year old female with HIV, pneumonia, and chronic low back pain. FINDINGS: BONES AND ALIGNMENT: Normal lumbar segmentation, concordant with the thoracic spine reported separately. Relatively normal lumbar lordosis. No significant scoliosis or spondylolisthesis. Background bone marrow signal and vertebral height within normal limits. Intact visible sacrum and SI joints. No marrow edema identified. No acute traumatic injury identified in the lumbar spine. No acute or inflammatory process in the lumbar spine. SPINAL CORD: Normal conus medullaris at L1. No abnormal intradural enhancement. No dural thickening. SOFT TISSUES: Evidence of rounded uterine fundal fibroid, at least 6.5 cm diameter on series 1 image 7. Negative visible abdominal viscera. Ordinary subcutaneous edema posterior to the lumbar spine. No paraspinal mass. T12-L1: Negative. L1-L2: Negative. L2-L3: Mild anterior disc bulging, mild to moderate facet hypertrophy. No stenosis. L3-L4: Mild disc desiccation and disc bulging. Mild posterior element hypertrophy. No stenosis. L4-L5: Mild disc desiccation, disc space loss, and circumferential disc bulge. Moderate facet and ligamentum flavum hypertrophy. Mild bilateral L4 neural foraminal stenosis. No lateral recess or spinal stenosis. L5-S1: Negative. IMPRESSION: 1. No acute no inflammatory finding in the lumbar spine. 2. Mild for age lumbar degeneration. No lumbar spinal stenosis or convincing neural impingement. 3. Incidental uterine fundal fibroid.  Electronically signed by: Helayne Hurst MD 01/25/2024 01:05 PM EST RP Workstation: [id number]   MR THORACIC SPINE W WO CONTRAST Result Date: 01/25/2024 EXAM: MRI THORACIC SPINE WITH AND WITHOUT INTRAVENOUS CONTRAST 01/25/2024 12:35:00 PM TECHNIQUE: Multiplanar multisequence MRI of the thoracic spine was performed with and without the administration of 10 mL gadobutrol (GADAVIST) 1 MMOL/ML injection. COMPARISON: Prior cervical spine radiographs 02/15/2023. CLINICAL HISTORY: 53 year old female with chronic mid-back pain, HIV, and pneumonia. FINDINGS: Limited sagittal imaging of the cervical spine: Widespread advanced cervical disc and endplate degeneration is evident. Appears similar to abnormal cervical radiographs last year. BONES AND ALIGNMENT: Thoracic spine segmentation appears to be normal. Maintained thoracic vertebral height. Benign T8 vertebral body hemangioma, normal variant. Background bone marrow signal in the thoracic spine is within normal limits. No abnormal marrow enhancement. No marrow edema identified. SPINAL CORD: Thoracic spinal cord appears normal. Conus medullaris at or near the T12-L1 level. No abnormal intradural enhancement. No dural thickening. SOFT TISSUES: Abnormal right lung with evidence of consolidation. No obvious pleural effusion. Grossly negative visible upper abdominal viscera. DEGENERATIVE CHANGES: Partially visible lower cervical spine Degenerative spinal stenosis. But Mild for age thoracic spine degeneration, especially when compared to the cervical. And largely normal for age thoracic intervertebral disc signal and morphology. No thoracic spinal stenosis. IMPRESSION: 1. No acute or inflammatory process identified in the thoracic spine. 2. 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 No thoracic spinal stenosis. 3. Abnormal right lung,  with evidence consolidation. Electronically signed  by: Helayne Hurst MD 01/25/2024 12:55 PM EST RP Workstation: [id number]   DG Chest Port 1 View Result Date: 01/25/2024 CLINICAL DATA:  Shortness of breath. EXAM: PORTABLE CHEST 1 VIEW COMPARISON:  01/23/2024 FINDINGS: Hazy opacity over the right mid and lower lung is suspicious for airspace disease although superimposed component of soft tissue may accentuate this finding. Patchy airspace disease noted left mid and lower lung. No substantial pleural effusion. The cardio pericardial silhouette is enlarged. Telemetry leads overlie the chest. IMPRESSION: 1. Hazy opacity over the right mid and lower lung is suspicious for airspace disease/pneumonia although superimposed component of soft tissue may accentuate this finding. 2. Patchy airspace disease left mid and lower lung  also suspicious for pneumonia. Electronically Signed   By: Camellia Candle M.D.   On: 01/25/2024 08:08   DG Chest Port 1 View Result Date: 01/23/2024 EXAM: 1 VIEW(S) XRAY OF THE CHEST 01/23/2024 07:06:14 AM COMPARISON: 12/30/2022 CLINICAL HISTORY: Respiratory distress and fever, sepsis - evaluate for abnormality FINDINGS: LUNGS AND PLEURA: Patchy left lung base opacity. Less pronounced streaky new right infrahilar opacity. Streaky left greater than right lung base opacity suspicious for developing bronchopneumonia / pneumonia. No consolidation. No pulmonary edema. No pleural effusion. No pneumothorax. HEART AND MEDIASTINUM: No acute abnormality of the cardiac and mediastinal silhouettes. BONES AND SOFT TISSUES: No acute osseous abnormality. IMPRESSION: 1. Streaky left greater than right lung base opacity suspicious for developing bronchopneumonia or pneumonia. No pleural effusion. Electronically signed by: Helayne Hurst MD 01/23/2024 07:13 AM EST RP Workstation: [id number]    Micro Results     Recent Results (from the past 240 hours)  Blood Culture (routine x 2)     Status: None   Collection Time: 01/23/24  6:25 AM   Specimen: BLOOD LEFT  WRIST  Result Value Ref Range Status   Specimen Description BLOOD LEFT WRIST  Final   Special Requests   Final    BOTTLES DRAWN AEROBIC ONLY Blood Culture results may not be optimal due to an inadequate volume of blood received in culture bottles   Culture   Final    NO GROWTH 5 DAYS Performed at [facility], [address], [place], [place] [address]    Report Status 01/28/2024 FINAL  Final  Resp panel by RT-PCR (RSV, Flu A&B, Covid)     Status: None   Collection Time: 01/23/24  6:53 AM   Specimen: Nasal Swab  Result Value Ref Range Status   SARS Coronavirus 2 by RT PCR NEGATIVE NEGATIVE Final   Influenza A by PCR NEGATIVE NEGATIVE Final   Influenza B by PCR NEGATIVE NEGATIVE Final    Comment: (NOTE) The Xpert Xpress SARS-CoV-2/FLU/RSV plus assay is intended as an aid in the diagnosis of influenza from Nasopharyngeal swab specimens and should not be used as a sole basis for treatment. Nasal washings and aspirates are unacceptable for Xpert Xpress SARS-CoV-2/FLU/RSV testing.  Fact Sheet for Patients: bloggercourse.com  Fact Sheet for Healthcare Providers: seriousbroker.it  This test is not yet approved or cleared by the [place]  FDA and has been authorized for detection and/or diagnosis of SARS-CoV-2 by FDA under an Emergency Use Authorization (EUA). This EUA will remain in effect (meaning this test can be used) for the duration of the COVID-19 declaration under Section 564(b)(1) of the Act, 21 U.S.C. section 360bbb-3(b)(1), unless the authorization is terminated or revoked.     Resp Syncytial Virus by PCR NEGATIVE NEGATIVE Final    Comment: (NOTE) Fact Sheet for Patients: bloggercourse.com  Fact Sheet for Healthcare Providers: seriousbroker.it  This test is not yet approved or cleared by the [place]  FDA and has been authorized for detection and/or  diagnosis of SARS-CoV-2 by FDA under an Emergency Use Authorization (EUA). This EUA will remain in effect (meaning this test can be used) for the duration of the COVID-19 declaration under Section 564(b)(1) of the Act, 21 U.S.C. section 360bbb-3(b)(1), unless the authorization is terminated or revoked.  Performed at [facility], [address], [place], [place] [address]   Blood Culture (routine x 2)     Status: Abnormal   Collection Time: 01/23/24  6:53 AM   Specimen: BLOOD  Result Value Ref  Range Status   Specimen Description BLOOD LEFT ANTECUBITAL  Final   Special Requests   Final    BOTTLES DRAWN AEROBIC AND ANAEROBIC Blood Culture results may not be optimal due to an inadequate volume of blood received in culture bottles   Culture  Setup Time   Final    GRAM POSITIVE COCCI IN CLUSTERS AEROBIC BOTTLE ONLY CRITICAL RESULT CALLED TO, READ BACK BY AND VERIFIED WITH: PHARMD G ABBOTT 01/24/2024 @ 0343 BY AB    Culture (A)  Final    STAPHYLOCOCCUS HOMINIS THE SIGNIFICANCE OF ISOLATING THIS ORGANISM FROM A SINGLE SET OF BLOOD CULTURES WHEN MULTIPLE SETS ARE DRAWN IS UNCERTAIN. PLEASE NOTIFY THE MICROBIOLOGY DEPARTMENT WITHIN ONE WEEK IF SPECIATION AND SENSITIVITIES ARE REQUIRED. Performed at [facility], [address], [place], [place] [address]    Report Status 01/26/2024 FINAL  Final  Respiratory (~20 pathogens) panel by PCR     Status: None   Collection Time: 01/23/24  6:53 AM   Specimen: Nasopharyngeal Swab; Respiratory  Result Value Ref Range Status   Adenovirus NOT DETECTED NOT DETECTED Final   Coronavirus 229E NOT DETECTED NOT DETECTED Final    Comment: (NOTE) The Coronavirus on the Respiratory Panel, DOES NOT test for the novel  Coronavirus (2019 nCoV)    Coronavirus HKU1 NOT DETECTED NOT DETECTED Final   Coronavirus NL63 NOT DETECTED NOT DETECTED Final   Coronavirus OC43 NOT DETECTED NOT DETECTED Final   Metapneumovirus NOT DETECTED NOT DETECTED Final    Rhinovirus / Enterovirus NOT DETECTED NOT DETECTED Final   Influenza A NOT DETECTED NOT DETECTED Final   Influenza B NOT DETECTED NOT DETECTED Final   Parainfluenza Virus 1 NOT DETECTED NOT DETECTED Final   Parainfluenza Virus 2 NOT DETECTED NOT DETECTED Final   Parainfluenza Virus 3 NOT DETECTED NOT DETECTED Final   Parainfluenza Virus 4 NOT DETECTED NOT DETECTED Final   Respiratory Syncytial Virus NOT DETECTED NOT DETECTED Final   Bordetella pertussis NOT DETECTED NOT DETECTED Final   Bordetella Parapertussis NOT DETECTED NOT DETECTED Final   Chlamydophila pneumoniae NOT DETECTED NOT DETECTED Final   Mycoplasma pneumoniae NOT DETECTED NOT DETECTED Final    Comment: Performed at [facility], [address] [place], [place] [address]  Blood Culture ID Panel (Reflexed)     Status: Abnormal   Collection Time: 01/23/24  6:53 AM  Result Value Ref Range Status   Enterococcus faecalis NOT DETECTED NOT DETECTED Final   Enterococcus Faecium NOT DETECTED NOT DETECTED Final   Listeria monocytogenes NOT DETECTED NOT DETECTED Final   Staphylococcus species DETECTED (A) NOT DETECTED Final    Comment: CRITICAL RESULT CALLED TO, READ BACK BY AND VERIFIED WITH: PHARMD G ABBOTT 01/24/2024 @ 0343 BY AB    Staphylococcus aureus (BCID) NOT DETECTED NOT DETECTED Final   Staphylococcus epidermidis NOT DETECTED NOT DETECTED Final   Staphylococcus lugdunensis NOT DETECTED NOT DETECTED Final   Streptococcus species NOT DETECTED NOT DETECTED Final   Streptococcus agalactiae NOT DETECTED NOT DETECTED Final   Streptococcus pneumoniae NOT DETECTED NOT DETECTED Final   Streptococcus pyogenes NOT DETECTED NOT DETECTED Final   A.calcoaceticus-baumannii NOT DETECTED NOT DETECTED Final   Bacteroides fragilis NOT DETECTED NOT DETECTED Final   Enterobacterales NOT DETECTED NOT DETECTED Final   Enterobacter cloacae complex NOT DETECTED NOT DETECTED Final   Escherichia coli NOT DETECTED NOT DETECTED Final    Klebsiella aerogenes NOT DETECTED NOT DETECTED Final   Klebsiella oxytoca NOT DETECTED NOT DETECTED  Final   Klebsiella pneumoniae NOT DETECTED NOT DETECTED Final   Proteus species NOT DETECTED NOT DETECTED Final   Salmonella species NOT DETECTED NOT DETECTED Final   Serratia marcescens NOT DETECTED NOT DETECTED Final   Haemophilus influenzae NOT DETECTED NOT DETECTED Final   Neisseria meningitidis NOT DETECTED NOT DETECTED Final   Pseudomonas aeruginosa NOT DETECTED NOT DETECTED Final   Stenotrophomonas maltophilia NOT DETECTED NOT DETECTED Final   Candida albicans NOT DETECTED NOT DETECTED Final   Candida auris NOT DETECTED NOT DETECTED Final   Candida glabrata NOT DETECTED NOT DETECTED Final   Candida krusei NOT DETECTED NOT DETECTED Final   Candida parapsilosis NOT DETECTED NOT DETECTED Final   Candida tropicalis NOT DETECTED NOT DETECTED Final   Cryptococcus neoformans/gattii NOT DETECTED NOT DETECTED Final    Comment: Performed at [facility], [address] [place], [place] [address]  Culture, blood (Routine X 2) w Reflex to ID Panel     Status: None   Collection Time: 01/24/24  8:01 AM   Specimen: BLOOD LEFT HAND  Result Value Ref Range Status   Specimen Description BLOOD LEFT HAND  Final   Special Requests   Final    BOTTLES DRAWN AEROBIC AND ANAEROBIC Blood Culture adequate volume   Culture   Final    NO GROWTH 5 DAYS Performed at [facility], [address], [place], [place] [address]    Report Status 01/29/2024 FINAL  Final  Culture, blood (Routine X 2) w Reflex to ID Panel     Status: None   Collection Time: 01/24/24  8:01 AM   Specimen: BLOOD LEFT ARM  Result Value Ref Range Status   Specimen Description BLOOD LEFT ARM  Final   Special Requests   Final    BOTTLES DRAWN AEROBIC AND ANAEROBIC Blood Culture adequate volume   Culture   Final    NO GROWTH 5 DAYS Performed at [facility], [address], [place], [place] [address]    Report Status  01/29/2024 FINAL  Final  Respiratory (~20 pathogens) panel by PCR     Status: None   Collection Time: 01/24/24  9:51 AM   Specimen: Nasopharyngeal Swab; Respiratory  Result Value Ref Range Status   Adenovirus NOT DETECTED NOT DETECTED Final   Coronavirus 229E NOT DETECTED NOT DETECTED Final    Comment: (NOTE) The Coronavirus on the Respiratory Panel, DOES NOT test for the novel  Coronavirus (2019 nCoV)    Coronavirus HKU1 NOT DETECTED NOT DETECTED Final   Coronavirus NL63 NOT DETECTED NOT DETECTED Final   Coronavirus OC43 NOT DETECTED NOT DETECTED Final   Metapneumovirus NOT DETECTED NOT DETECTED Final   Rhinovirus / Enterovirus NOT DETECTED NOT DETECTED Final   Influenza A NOT DETECTED NOT DETECTED Final   Influenza B NOT DETECTED NOT DETECTED Final   Parainfluenza Virus 1 NOT DETECTED NOT DETECTED Final   Parainfluenza Virus 2 NOT DETECTED NOT DETECTED Final   Parainfluenza Virus 3 NOT DETECTED NOT DETECTED Final   Parainfluenza Virus 4 NOT DETECTED NOT DETECTED Final   Respiratory Syncytial Virus NOT DETECTED NOT DETECTED Final   Bordetella pertussis NOT DETECTED NOT DETECTED Final   Bordetella Parapertussis NOT DETECTED NOT DETECTED Final   Chlamydophila pneumoniae NOT DETECTED NOT DETECTED Final   Mycoplasma pneumoniae NOT DETECTED NOT DETECTED Final    Comment: Performed at [facility], [address] [place], [place] [address]  Expectorated Sputum Assessment w Gram Stain, Rflx  to Resp Cult     Status: None   Collection Time: 01/25/24  7:06 AM   Specimen: Sputum  Result Value Ref Range Status   Specimen Description SPUTUM  Final   Special Requests NONE  Final   Sputum evaluation   Final    Sputum specimen not acceptable for testing.  Please recollect.   Gram Stain Report Called to,Read Back By and Verified With: RN DOROTHA FOY [id number] @ [phone] FH Performed at [facility], [address] [place], [place] [address]    Report Status 01/25/2024 FINAL  Final   Culture, blood (Routine X 2) w Reflex to ID Panel     Status: None (Preliminary result)   Collection Time: 01/25/24  9:09 AM   Specimen: BLOOD RIGHT HAND  Result Value Ref Range Status   Specimen Description BLOOD RIGHT HAND  Final   Special Requests   Final    BOTTLES DRAWN AEROBIC AND ANAEROBIC Blood Culture results may not be optimal due to an inadequate volume of blood received in culture bottles   Culture   Final    NO GROWTH 4 DAYS Performed at [facility], [address], [place], [place] [address]    Report Status PENDING  Incomplete  Culture, blood (Routine X 2) w Reflex to ID Panel     Status: None (Preliminary result)   Collection Time: 01/25/24  9:10 AM   Specimen: BLOOD RIGHT ARM  Result Value Ref Range Status   Specimen Description BLOOD RIGHT ARM  Final   Special Requests   Final    BOTTLES DRAWN AEROBIC AND ANAEROBIC Blood Culture results may not be optimal due to an inadequate volume of blood received in culture bottles   Culture   Final    NO GROWTH 4 DAYS Performed at [facility], [address] [place], [place] [address]    Report Status PENDING  Incomplete  MRSA Next Gen by PCR, Nasal     Status: None   Collection Time: 01/25/24  9:25 AM   Specimen: Nasal Mucosa; Nasal Swab  Result Value Ref Range Status   MRSA by PCR Next Gen NOT DETECTED NOT DETECTED Final    Comment: (NOTE) The GeneXpert MRSA Assay (FDA approved for NASAL specimens only), is one component of a comprehensive MRSA colonization surveillance program. It is not intended to diagnose MRSA infection nor to guide or monitor treatment for MRSA infections. Test performance is not FDA approved in patients less than 42 years old. Performed at [facility], [address], [place], [place] [address]   Expectorated Sputum Assessment w Gram Stain, Rflx to Resp Cult     Status: None   Collection Time: 01/26/24  3:12 PM   Specimen: Expectorated Sputum  Result Value Ref Range  Status   Specimen Description EXPECTORATED SPUTUM  Final   Special Requests NONE  Final   Sputum evaluation   Final    Sputum specimen not acceptable for testing.  Please recollect.   NOTIFIED RN [facility] ON 01/26/24 @ 1715 BY DRT Performed at [facility], [address], [place], [place] [address]    Report Status 01/26/2024 FINAL  Final    Today   Subjective    Paula Martinez today has no headache,no chest abdominal pain,no new weakness tingling or numbness, feels much better wants to go home today.     Objective   Blood pressure 104/80, pulse (!) 56, temperature 98.4 F (36.9  C), temperature source Oral, resp. rate 18, height 5' 7 (1.702 m), weight 90.7 kg, SpO2 99%.   Intake/Output Summary (Last 24 hours) at 01/30/2024 0812 Last data filed at 01/29/2024 1600 Gross per 24 hour  Intake 1000 ml  Output --  Net 1000 ml    Exam  Awake Alert, No new F.N deficits,    [place].AT,PERRAL Supple Neck,   Symmetrical Chest wall movement, Good air movement bilaterally, CTAB RRR,No Gallops,   +ve B.Sounds, Abd Soft, Non tender,  No Cyanosis, Clubbing or edema    Data Review   Recent Labs  Lab 01/25/24 0337 01/26/24 0214 01/28/24 0448 01/29/24 0350 01/30/24 0619  WBC 9.2 6.3 5.9 6.7 7.9  HGB 11.6* 10.5* 10.3* 10.2* 11.1*  HCT 32.9* 29.4* 28.2* 28.2* 31.0*  PLT 199 223 348 418* 533*  MCV 79.9* 79.0* 77.5* 78.6* 78.9*  MCH 28.2 28.2 28.3 28.4 28.2  MCHC 35.3 35.7 36.5* 36.2* 35.8  RDW 13.5 13.4 13.3 13.6 13.8  LYMPHSABS  --   --   --  2.3 PENDING  MONOABS  --   --   --  0.4 PENDING  EOSABS  --   --   --  0.1 PENDING  BASOSABS  --   --   --  0.1 PENDING    Recent Labs  Lab 01/23/24 1333 01/23/24 2308 01/24/24 0801 01/25/24 0337 01/25/24 0909 01/26/24 0214 01/28/24 0448 01/29/24 0350 01/30/24 0619  NA 135  --  136 135  --  139 140 140 139  K 3.9  --  3.9 3.9  --  3.5 3.6 3.7 3.8  CL 108  --  109 109  --  110 110 111 107  CO2 16*  --  17* 16*  --   19* 21* 21* 18*  ANIONGAP 11  --  10 10  --  10 9 8 14   GLUCOSE 170*  --  184* 98  --  99 103* 107* 98  BUN 6  --  6 8  --  5* 6 8 9   CREATININE 0.83  --  0.77 0.88  --  0.71 0.66 0.68 0.63  AST  --   --  21  --   --  44*  --   --   --   ALT  --   --  19  --   --  36  --   --   --   ALKPHOS  --   --  63  --   --  51  --   --   --   BILITOT  --   --  1.1  --   --  1.2  --   --   --   ALBUMIN  --   --  2.4*  --   --  2.0*  --   --   --   CRP  --   --   --   --  29.1*  --   --   --   --   PROCALCITON 0.39  --   --   --   --   --   --   --   --   MG  --  2.0  --   --   --   --   --  2.0 2.1  CALCIUM  7.8*  --  8.3* 8.1*  --  8.2* 8.2* 8.4* 8.6*    Total Time in preparing paper work, data evaluation and todays exam - 35  minutes  Signature  -    Lavada Stank M.D on 01/30/2024 at 8:12 AM   -  To page go to [email or web address]

## 2024-01-30 NOTE — Discharge Instructions (Signed)
 Follow with Primary MD Sun, Vyvyan, MD in 7 days, follow-up with your infectious disease physician within 7 to 10 days of discharge.  Get CBC, CMP, Magnesium , 2 view Chest X ray -  checked next visit with your primary MD    Activity: As tolerated with Full fall precautions use walker/cane & assistance as needed  Disposition Home    Diet: Heart Healthy    Special Instructions: If you have smoked or chewed Tobacco  in the last 2 yrs please stop smoking, stop any regular Alcohol  and or any Recreational drug use.  On your next visit with your primary care physician please Get Medicines reviewed and adjusted.  Please request your Prim.MD to go over all Hospital Tests and Procedure/Radiological results at the follow up, please get all Hospital records sent to your Prim MD by signing hospital release before you go home.  If you experience worsening of your admission symptoms, develop shortness of breath, life threatening emergency, suicidal or homicidal thoughts you must seek medical attention immediately by calling 911 or calling your MD immediately  if symptoms less severe.  You Must read complete instructions/literature along with all the possible adverse reactions/side effects for all the Medicines you take and that have been prescribed to you. Take any new Medicines after you have completely understood and accpet all the possible adverse reactions/side effects.   Do not drive when taking Pain medications.  Do not take more than prescribed Pain, Sleep and Anxiety Medications  Wear Seat belts while driving.

## 2024-01-30 NOTE — Plan of Care (Signed)

## 2024-01-30 NOTE — TOC Transition Note (Signed)
 Transition of Care [facility]) - Discharge Note   Patient Details  Name: Paula Martinez MRN: [id number] Date of Birth: 12/23/1970  [facility] CM/SW Contact:  Marval Gell, RN Phone Number: 01/30/2024, 10:59 AM   Clinical Narrative:     Paula Martinez of [place]     Barriers to Discharge: Continued Medical Work up   Patient Goals and CMS Choice            Discharge Placement                       Discharge Plan and Services Additional resources added to the After Visit Summary for                                       Social Drivers of Health (SDOH) Interventions SDOH Screenings   Food Insecurity: No Food Insecurity (01/26/2024)  Housing: Low Risk  (01/26/2024)  Transportation Needs: No Transportation Needs (01/26/2024)  Utilities: Not At Risk (01/26/2024)  Depression (PHQ2-9): Low Risk  (11/20/2023)  Tobacco Use: Medium Risk (01/19/2024)   Received from [facility]     Readmission Risk Interventions     No data to display

## 2024-01-31 NOTE — Telephone Encounter (Signed)
 Patient called today regarding her wheelchair. Advised the patient that the paper work has been faxed to [employer] for her.  Also per the case manager that patient was seen by in the hospital and PT note patient does not qualify for a wheelchair and may not be approved.  Patient advised of this stated she with have the [facility] PT evaluate her again today in the home.  Garrus Gauthreaux ONEIDA Ligas, CMA

## 2024-02-09 ENCOUNTER — Other Ambulatory Visit (HOSPITAL_COMMUNITY): Payer: Self-pay

## 2024-02-13 ENCOUNTER — Other Ambulatory Visit (HOSPITAL_COMMUNITY): Payer: Self-pay

## 2024-02-13 ENCOUNTER — Other Ambulatory Visit: Payer: Self-pay

## 2024-02-13 NOTE — Progress Notes (Signed)
 Specialty Pharmacy Refill Coordination Note  Paula Martinez is a 52 y.o. female contacted today regarding refills of specialty medication(s) Bictegravir-Emtricitab-Tenofov (Biktarvy )   Patient requested Delivery   Delivery date: 02/16/24   Verified address: [address] [place] [address]   Medication will be filled on: 02/15/24

## 2024-02-15 ENCOUNTER — Other Ambulatory Visit: Payer: Self-pay
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
## 2024-02-20 ENCOUNTER — Other Ambulatory Visit: Payer: Self-pay | Admitting: Infectious Disease

## 2024-02-20 DIAGNOSIS — E785 Hyperlipidemia, unspecified: Secondary | ICD-10-CM

## 2024-02-20 DIAGNOSIS — B20 Human immunodeficiency virus [HIV] disease: Secondary | ICD-10-CM

## 2024-02-22 ENCOUNTER — Encounter: Attending: Physical Medicine and Rehabilitation | Admitting: Physical Medicine and Rehabilitation

## 2024-02-22 DIAGNOSIS — I69351 Hemiplegia and hemiparesis following cerebral infarction affecting right dominant side: Secondary | ICD-10-CM | POA: Insufficient documentation

## 2024-02-26 ENCOUNTER — Other Ambulatory Visit (HOSPITAL_COMMUNITY): Payer: Self-pay

## 2024-02-26 MED ORDER — TRAZODONE HCL 50 MG PO TABS
50.0000 mg | ORAL_TABLET | Freq: Every evening | ORAL | 0 refills | Status: AC | PRN
  Filled 2024-02-26 – 2024-03-04 (×3): qty 30, 30d supply, fill #0

## 2024-02-29 ENCOUNTER — Other Ambulatory Visit: Payer: Self-pay | Admitting: Family Medicine

## 2024-02-29 DIAGNOSIS — R590 Localized enlarged lymph nodes: Secondary | ICD-10-CM

## 2024-03-01 ENCOUNTER — Other Ambulatory Visit (HOSPITAL_COMMUNITY): Payer: Self-pay

## 2024-03-01 MED ORDER — NALOXONE HCL 4 MG/0.1ML NA LIQD
NASAL | 2 refills | Status: AC
  Filled 2024-03-01: qty 2, 1d supply, fill #0
  Filled 2024-03-02: qty 2, 30d supply, fill #0
  Filled 2024-03-04: qty 2, 15d supply, fill #0

## 2024-03-01 MED ORDER — BUSPIRONE HCL 10 MG PO TABS
10.0000 mg | ORAL_TABLET | Freq: Two times a day (BID) | ORAL | 0 refills | Status: DC
  Filled 2024-03-01 – 2024-03-04 (×3): qty 60, 30d supply, fill #0

## 2024-03-01 MED ORDER — CELECOXIB 200 MG PO CAPS
200.0000 mg | ORAL_CAPSULE | Freq: Two times a day (BID) | ORAL | 2 refills | Status: AC
  Filled 2024-03-01 – 2024-03-04 (×3): qty 60, 30d supply, fill #0

## 2024-03-01 MED ORDER — GABAPENTIN 300 MG PO CAPS
300.0000 mg | ORAL_CAPSULE | Freq: Two times a day (BID) | ORAL | 0 refills | Status: AC
  Filled 2024-03-01 – 2024-03-04 (×3): qty 10, 5d supply, fill #0

## 2024-03-01 MED ORDER — OXYCODONE-ACETAMINOPHEN 5-325 MG PO TABS
1.0000 | ORAL_TABLET | Freq: Four times a day (QID) | ORAL | 0 refills | Status: DC | PRN
  Filled 2024-03-01 – 2024-03-04 (×3): qty 30, 8d supply, fill #0

## 2024-03-01 MED ORDER — METHOCARBAMOL 500 MG PO TABS
500.0000 mg | ORAL_TABLET | Freq: Three times a day (TID) | ORAL | 2 refills | Status: AC
  Filled 2024-03-01 – 2024-03-04 (×3): qty 90, 30d supply, fill #0

## 2024-03-02 ENCOUNTER — Other Ambulatory Visit (HOSPITAL_COMMUNITY): Payer: Self-pay

## 2024-03-04 ENCOUNTER — Other Ambulatory Visit (HOSPITAL_BASED_OUTPATIENT_CLINIC_OR_DEPARTMENT_OTHER): Payer: Self-pay

## 2024-03-04 ENCOUNTER — Encounter: Payer: Self-pay | Admitting: Family Medicine

## 2024-03-04 ENCOUNTER — Ambulatory Visit: Admitting: Family Medicine

## 2024-03-04 ENCOUNTER — Other Ambulatory Visit: Payer: Self-pay

## 2024-03-04 ENCOUNTER — Other Ambulatory Visit (HOSPITAL_COMMUNITY): Payer: Self-pay

## 2024-03-04 VITALS — BP 118/82 | HR 70 | Temp 98.2°F | Ht 67.5 in | Wt 212.0 lb

## 2024-03-04 DIAGNOSIS — F411 Generalized anxiety disorder: Secondary | ICD-10-CM

## 2024-03-04 DIAGNOSIS — I69351 Hemiplegia and hemiparesis following cerebral infarction affecting right dominant side: Secondary | ICD-10-CM | POA: Diagnosis not present

## 2024-03-04 DIAGNOSIS — N951 Menopausal and female climacteric states: Secondary | ICD-10-CM | POA: Diagnosis not present

## 2024-03-04 DIAGNOSIS — N3289 Other specified disorders of bladder: Secondary | ICD-10-CM | POA: Diagnosis not present

## 2024-03-04 DIAGNOSIS — B20 Human immunodeficiency virus [HIV] disease: Secondary | ICD-10-CM | POA: Diagnosis not present

## 2024-03-04 DIAGNOSIS — R3915 Urgency of urination: Secondary | ICD-10-CM

## 2024-03-04 MED ORDER — VEOZAH 45 MG PO TABS
45.0000 mg | ORAL_TABLET | Freq: Every evening | ORAL | 1 refills | Status: AC | PRN
  Filled 2024-03-04: qty 30, 30d supply, fill #0

## 2024-03-04 NOTE — Patient Instructions (Addendum)
 Welcome to [facility]!  Thank you for choosing us  for your Primary Care needs.   We offer in person and video appointments for your convenience. You may call our office to schedule appointments, or you may schedule appointments with me through MyChart.   The best way to get in contact with me is via MyChart message. This will get to me faster than a phone call, unless there is an emergency, then please call 911.  The lab is located downstairs in the Sports Medicine building, we also have xray available there.   [place]  to pharmacy. If this is not covered, please let me know and we will try clonidine at bedtime.   Follow up with me in about 3 months for labs and medication management, sooner if needed.
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
## 2024-03-05 ENCOUNTER — Other Ambulatory Visit (HOSPITAL_COMMUNITY): Payer: Self-pay

## 2024-03-05 MED ORDER — OXYCODONE-ACETAMINOPHEN 5-325 MG PO TABS
1.0000 | ORAL_TABLET | Freq: Four times a day (QID) | ORAL | 0 refills | Status: DC | PRN
  Filled 2024-03-11: qty 56, 14d supply, fill #0

## 2024-03-05 MED ORDER — NALOXONE HCL 4 MG/0.1ML NA LIQD: NASAL | 2 refills | Status: AC

## 2024-03-05 MED ORDER — GABAPENTIN 300 MG PO CAPS
300.0000 mg | ORAL_CAPSULE | Freq: Two times a day (BID) | ORAL | 2 refills | Status: AC
  Filled 2024-03-05: qty 60, 30d supply, fill #0

## 2024-03-05 MED ORDER — BUSPIRONE HCL 10 MG PO TABS
10.0000 mg | ORAL_TABLET | Freq: Two times a day (BID) | ORAL | 0 refills | Status: AC
  Filled 2024-03-05: qty 60, 30d supply, fill #0

## 2024-03-06 ENCOUNTER — Other Ambulatory Visit (HOSPITAL_COMMUNITY): Payer: Self-pay

## 2024-03-06 ENCOUNTER — Telehealth (HOSPITAL_COMMUNITY): Payer: Self-pay

## 2024-03-06 ENCOUNTER — Other Ambulatory Visit: Payer: Self-pay

## 2024-03-06 NOTE — Telephone Encounter (Signed)
 To proceed with the prior authorization for Veozah  45MG  tablets, we will need to attach chart notes from the most recent office visit. Once the notes are signed, we can submit the PA.

## 2024-03-06 NOTE — Telephone Encounter (Signed)
 PA request has been Received. New Encounter has been or will be created for follow up. For additional info see Pharmacy Prior Auth telephone encounter from 03/06/24.

## 2024-03-08 ENCOUNTER — Other Ambulatory Visit: Payer: Self-pay

## 2024-03-11 ENCOUNTER — Other Ambulatory Visit: Payer: Self-pay

## 2024-03-11 ENCOUNTER — Other Ambulatory Visit (HOSPITAL_COMMUNITY): Payer: Self-pay

## 2024-03-11 ENCOUNTER — Telehealth: Payer: Self-pay

## 2024-03-11 DIAGNOSIS — R634 Abnormal weight loss: Secondary | ICD-10-CM

## 2024-03-11 NOTE — Telephone Encounter (Signed)
 Copied from CRM #[id number]. Topic: Clinical - Medication Question >> Mar 11, 2024  1:27 PM Paula Martinez wrote: Reason for CRM: Patient lost roughly 11 pounds from when she had pneumonia and she was wondering if PCP could write her a prescription for Ensure Plus. Patient's preferred pharmacy is [facility]. Please reach out to patient before end of day.

## 2024-03-11 NOTE — Progress Notes (Signed)
 Specialty Pharmacy Refill Coordination Note  Paula Martinez is a 53 y.o. female contacted today regarding refills of specialty medication(s) Bictegravir-Emtricitab-Tenofov (Biktarvy )   Patient requested Delivery   Delivery date: 03/15/24   Verified address: [address] [place] [address]   Medication will be filled on: 03/13/24

## 2024-03-12 ENCOUNTER — Other Ambulatory Visit (HOSPITAL_COMMUNITY): Payer: Self-pay

## 2024-03-12 ENCOUNTER — Other Ambulatory Visit: Payer: Self-pay

## 2024-03-12 MED ORDER — ENSURE ENLIVE PO LIQD: 237.0000 mL | Freq: Two times a day (BID) | ORAL | 3 refills | Status: DC

## 2024-03-12 NOTE — Telephone Encounter (Signed)
 Can I get an update on when this note will be signed so I can submit the PA?

## 2024-03-12 NOTE — Telephone Encounter (Signed)
"  LVM for patient to return call.   "

## 2024-03-12 NOTE — Telephone Encounter (Signed)
 Spoke with patient, gave verbal understanding.  Will you let me know when her recent office note is completed? Her Veozah  needs a PA, she is asking about the status

## 2024-03-12 NOTE — Addendum Note (Signed)
 Addended by: Alenna Russell L on: 03/12/2024 08:22 AM   Modules accepted: Orders

## 2024-03-12 NOTE — Telephone Encounter (Signed)
 Pharmacy Patient Advocate Encounter  Received notification from [facility] that Prior Authorization for Veozah  45MG  tablets  has been APPROVED from 03/12/24 to 09/10/24. Ran test claim, Copay is $0. This test claim was processed through [facility]- copay amounts may vary at other pharmacies due to pharmacy/plan contracts, or as the patient moves through the different stages of their insurance plan.   PA #/Case ID/Reference #: [id number]

## 2024-03-13 ENCOUNTER — Other Ambulatory Visit: Payer: Self-pay

## 2024-03-22 ENCOUNTER — Other Ambulatory Visit

## 2024-03-30 ENCOUNTER — Other Ambulatory Visit (HOSPITAL_COMMUNITY): Payer: Self-pay

## 2024-03-30 MED ORDER — GABAPENTIN 300 MG PO CAPS
300.0000 mg | ORAL_CAPSULE | Freq: Two times a day (BID) | ORAL | 2 refills | Status: AC
  Filled 2024-03-30: qty 60, 30d supply, fill #0

## 2024-03-30 MED ORDER — NALOXONE HCL 4 MG/0.1ML NA LIQD
NASAL | 2 refills | Status: AC
  Filled 2024-03-30: qty 2, 1d supply, fill #0

## 2024-03-30 MED ORDER — ALPRAZOLAM 0.25 MG PO TABS
0.2500 mg | ORAL_TABLET | Freq: Two times a day (BID) | ORAL | 0 refills | Status: AC | PRN
  Filled 2024-03-30 – 2024-04-12 (×2): qty 28, 14d supply, fill #0

## 2024-03-30 MED ORDER — BUSPIRONE HCL 15 MG PO TABS
15.0000 mg | ORAL_TABLET | Freq: Two times a day (BID) | ORAL | 0 refills | Status: AC
  Filled 2024-03-30: qty 60, 30d supply, fill #0

## 2024-03-30 MED ORDER — OXYCODONE-ACETAMINOPHEN 5-325 MG PO TABS
1.0000 | ORAL_TABLET | Freq: Four times a day (QID) | ORAL | 0 refills | Status: AC | PRN
  Filled 2024-03-30: qty 120, 30d supply, fill #0

## 2024-04-02 ENCOUNTER — Other Ambulatory Visit: Payer: Self-pay

## 2024-04-05 ENCOUNTER — Other Ambulatory Visit (HOSPITAL_COMMUNITY): Payer: Self-pay

## 2024-04-08 ENCOUNTER — Telehealth: Payer: Self-pay

## 2024-04-08 NOTE — Telephone Encounter (Signed)
 Copied from CRM #[id number]. Topic: Referral - Request for Referral >> Apr 05, 2024  1:12 PM Emylou G wrote: Did the patient discuss referral with their provider in the last year? No (If No - schedule appointment) (If Yes - send message)  Appointment offered? Yes  Type of order/referral and detailed reason for visit: rheumatologist  Preference of office, provider, location: [place] w/[place] location..   If referral order, have you been seen by this specialty before? No (If Yes, this issue or another issue? When? Where?  Can we respond through MyChart? No

## 2024-04-09 ENCOUNTER — Telehealth: Payer: Self-pay

## 2024-04-09 ENCOUNTER — Other Ambulatory Visit: Payer: Self-pay

## 2024-04-09 DIAGNOSIS — R634 Abnormal weight loss: Secondary | ICD-10-CM

## 2024-04-09 MED ORDER — ENSURE PLUS HIGH PROTEIN PO LIQD: 237.0000 mL | Freq: Two times a day (BID) | ORAL | 2 refills | Status: AC

## 2024-04-09 MED ORDER — ENSURE ENLIVE PO LIQD: 237.0000 mL | Freq: Two times a day (BID) | ORAL | 3 refills | Status: DC

## 2024-04-09 NOTE — Telephone Encounter (Signed)
 Spoke with patient, working on getting ensure script to properly print. Will fax what we have available at the moment to see if [facility] will accept.

## 2024-04-09 NOTE — Telephone Encounter (Signed)
 Copied from CRM #[id number]. Topic: Referral - Request for Referral >> Apr 09, 2024 12:36 PM Berwyn MATSU wrote: Did the patient discuss referral with their provider in the last year? Yes (If No - schedule appointment) (If Yes - send message)  Appointment offered? Yes  Type of order/referral and detailed reason for visit: Patient called in to request a referral for rheumatology as pain management did labs and is concerned for RA.   Preference of office, provider, location:any  If referral order, have you been seen by this specialty before? No (If Yes, this issue or another issue? When? Where?  Can we respond through MyChart? no

## 2024-04-09 NOTE — Telephone Encounter (Signed)
 Copied from CRM #[id number]. Topic: Clinical - Prescription Issue >> Apr 09, 2024 12:32 PM Berwyn MATSU wrote: Reason for CRM: Patient called in to ask Np Alvia to send prescription for feeding supplement (ENSURE ENLIVE / ENSURE PLUS) LIQD over to [facility] at   Fax:[phone]  May you please assist.

## 2024-04-10 ENCOUNTER — Other Ambulatory Visit: Payer: Self-pay

## 2024-04-11 NOTE — Telephone Encounter (Signed)
 Script sent 1/28 and received confirmation

## 2024-04-11 NOTE — Telephone Encounter (Unsigned)
 Copied from CRM #[id number]. Topic: Clinical - Prescription Issue >> Apr 09, 2024 12:32 PM Berwyn MATSU wrote: Reason for CRM: Patient called in to ask Np Alvia to send prescription for feeding supplement (ENSURE ENLIVE / ENSURE PLUS) LIQD over to [facility] at   Fax:[phone]  May you please assist. >> Apr 11, 2024  4:09 PM Drema MATSU wrote: Pt is calling to check on ENSURE. >> Apr 09, 2024  1:43 PM Larissa S wrote: Patient called back after call drop. Advised message has been sent.

## 2024-04-11 NOTE — Telephone Encounter (Signed)
 LVM for patient

## 2024-04-12 ENCOUNTER — Other Ambulatory Visit: Payer: Self-pay

## 2024-04-12 NOTE — Progress Notes (Signed)
 Specialty Pharmacy Refill Coordination Note  Paula Martinez is a 54 y.o. female contacted today regarding refills of specialty medication(s) Bictegravir-Emtricitab-Tenofov (Biktarvy )   Patient requested Delivery   Delivery date: 04/15/24   Verified address: [address] [place] [address]   Medication will be filled on: 04/12/24

## 2024-04-15 ENCOUNTER — Other Ambulatory Visit (HOSPITAL_COMMUNITY): Payer: Self-pay

## 2024-04-16 ENCOUNTER — Ambulatory Visit: Admitting: Family Medicine

## 2024-06-03 ENCOUNTER — Ambulatory Visit: Payer: Self-pay | Admitting: Infectious Disease

## 2024-11-06 ENCOUNTER — Ambulatory Visit
